# Patient Record
Sex: Female | Born: 1980 | Race: White | Hispanic: No | Marital: Single | Smoking: Never smoker
Health system: Southern US, Community
[De-identification: ages and names within clinical notes are randomized; demographics above are authoritative.]

## PROBLEM LIST (undated history)

## (undated) DIAGNOSIS — F419 Anxiety disorder, unspecified: Secondary | ICD-10-CM

## (undated) DIAGNOSIS — D6851 Activated protein C resistance: Secondary | ICD-10-CM

## (undated) DIAGNOSIS — C801 Malignant (primary) neoplasm, unspecified: Secondary | ICD-10-CM

## (undated) DIAGNOSIS — Z7901 Long term (current) use of anticoagulants: Secondary | ICD-10-CM

## (undated) DIAGNOSIS — F329 Major depressive disorder, single episode, unspecified: Secondary | ICD-10-CM

## (undated) DIAGNOSIS — I82409 Acute embolism and thrombosis of unspecified deep veins of unspecified lower extremity: Secondary | ICD-10-CM

## (undated) DIAGNOSIS — R519 Headache, unspecified: Secondary | ICD-10-CM

## (undated) DIAGNOSIS — G43909 Migraine, unspecified, not intractable, without status migrainosus: Secondary | ICD-10-CM

## (undated) DIAGNOSIS — I2699 Other pulmonary embolism without acute cor pulmonale: Secondary | ICD-10-CM

## (undated) DIAGNOSIS — I890 Lymphedema, not elsewhere classified: Secondary | ICD-10-CM

## (undated) DIAGNOSIS — C4490 Unspecified malignant neoplasm of skin, unspecified: Secondary | ICD-10-CM

## (undated) DIAGNOSIS — D649 Anemia, unspecified: Secondary | ICD-10-CM

## (undated) DIAGNOSIS — Z87442 Personal history of urinary calculi: Secondary | ICD-10-CM

## (undated) DIAGNOSIS — D751 Secondary polycythemia: Secondary | ICD-10-CM

## (undated) DIAGNOSIS — N200 Calculus of kidney: Secondary | ICD-10-CM

## (undated) HISTORY — DX: Migraine, unspecified, not intractable, without status migrainosus: G43.909

## (undated) HISTORY — PX: IR FALLOPIAN TUBE CATHETERIZATION: IMG633

## (undated) HISTORY — DX: Calculus of kidney: N20.0

## (undated) HISTORY — DX: Acute embolism and thrombosis of unspecified deep veins of unspecified lower extremity: I82.409

## (undated) HISTORY — DX: Unspecified malignant neoplasm of skin, unspecified: C44.90

## (undated) HISTORY — DX: Major depressive disorder, single episode, unspecified: F32.9

## (undated) HISTORY — DX: Long term (current) use of anticoagulants: Z79.01

## (undated) HISTORY — PX: IVC FILTER INSERTION: CATH118245

## (undated) HISTORY — DX: Secondary polycythemia: D75.1

## (undated) HISTORY — PX: OTHER SURGICAL HISTORY: SHX169

## (undated) HISTORY — PX: GANGLION CYST EXCISION: SHX1691

## (undated) HISTORY — PX: WISDOM TOOTH EXTRACTION: SHX21

---

## 2000-07-16 DIAGNOSIS — I82409 Acute embolism and thrombosis of unspecified deep veins of unspecified lower extremity: Secondary | ICD-10-CM

## 2000-07-16 HISTORY — DX: Acute embolism and thrombosis of unspecified deep veins of unspecified lower extremity: I82.409

## 2004-02-20 ENCOUNTER — Other Ambulatory Visit: Payer: Self-pay

## 2004-12-13 ENCOUNTER — Emergency Department: Payer: Self-pay | Admitting: Emergency Medicine

## 2004-12-14 ENCOUNTER — Ambulatory Visit: Payer: Self-pay | Admitting: Emergency Medicine

## 2005-07-25 ENCOUNTER — Ambulatory Visit: Payer: Self-pay | Admitting: Unknown Physician Specialty

## 2005-08-06 ENCOUNTER — Emergency Department: Payer: Self-pay | Admitting: Emergency Medicine

## 2006-03-26 ENCOUNTER — Emergency Department: Payer: Self-pay | Admitting: Emergency Medicine

## 2008-09-28 ENCOUNTER — Ambulatory Visit: Payer: Self-pay | Admitting: Gastroenterology

## 2008-11-09 ENCOUNTER — Ambulatory Visit: Payer: Self-pay | Admitting: Gastroenterology

## 2008-12-28 ENCOUNTER — Emergency Department: Payer: Self-pay | Admitting: Emergency Medicine

## 2009-03-23 ENCOUNTER — Ambulatory Visit: Payer: Self-pay | Admitting: Nurse Practitioner

## 2009-04-15 ENCOUNTER — Ambulatory Visit: Payer: Self-pay | Admitting: Nurse Practitioner

## 2009-05-16 ENCOUNTER — Ambulatory Visit: Payer: Self-pay | Admitting: Nurse Practitioner

## 2009-07-06 ENCOUNTER — Emergency Department: Payer: Self-pay

## 2009-12-19 ENCOUNTER — Inpatient Hospital Stay: Payer: Self-pay | Admitting: Internal Medicine

## 2010-01-03 ENCOUNTER — Ambulatory Visit: Payer: Self-pay | Admitting: Internal Medicine

## 2010-04-05 ENCOUNTER — Ambulatory Visit: Payer: Self-pay | Admitting: Obstetrics and Gynecology

## 2010-04-14 ENCOUNTER — Ambulatory Visit: Payer: Self-pay | Admitting: Cardiovascular Disease

## 2010-04-18 ENCOUNTER — Ambulatory Visit: Payer: Self-pay | Admitting: Obstetrics and Gynecology

## 2011-04-25 ENCOUNTER — Ambulatory Visit: Payer: Self-pay | Admitting: Internal Medicine

## 2011-05-02 ENCOUNTER — Ambulatory Visit: Payer: Self-pay | Admitting: Internal Medicine

## 2011-05-18 ENCOUNTER — Ambulatory Visit: Payer: Self-pay | Admitting: Internal Medicine

## 2011-08-24 ENCOUNTER — Emergency Department: Payer: Self-pay | Admitting: Unknown Physician Specialty

## 2011-09-04 IMAGING — US US EXTREM LOW VENOUS*R*
1 series · 17 of 22 positions shown · non-contrast
Comparison: none

REASON FOR EXAM: hx of DVT 6 yrs ago, now with swelling, possible DVT
COMMENTS:

PROCEDURE:     US  - US DOPPLER LOW EXTR RIGHT  - December 19, 2009  [DATE]
RESULT:     History: DVT.

[Series 1: us extrem low venous*right* · 17 of 22 slices shown]
[im 1/22]
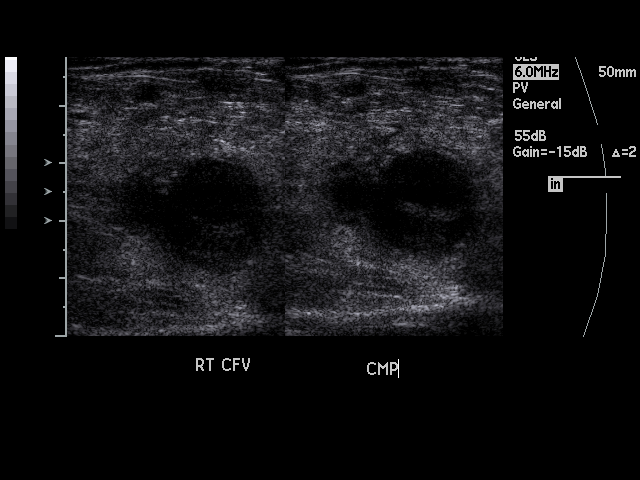
[im 2/22]
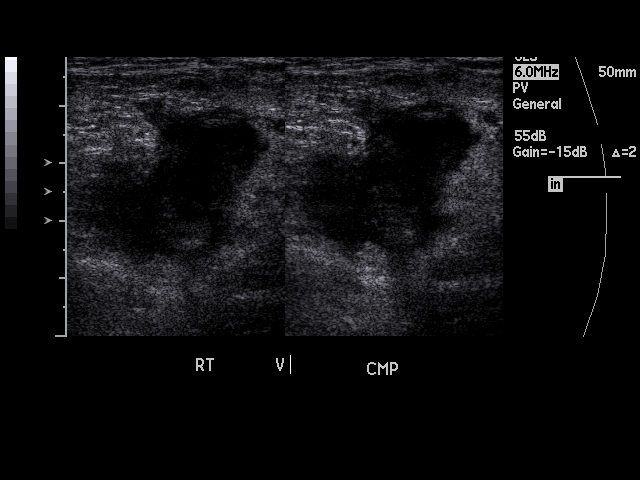
[im 4/22]
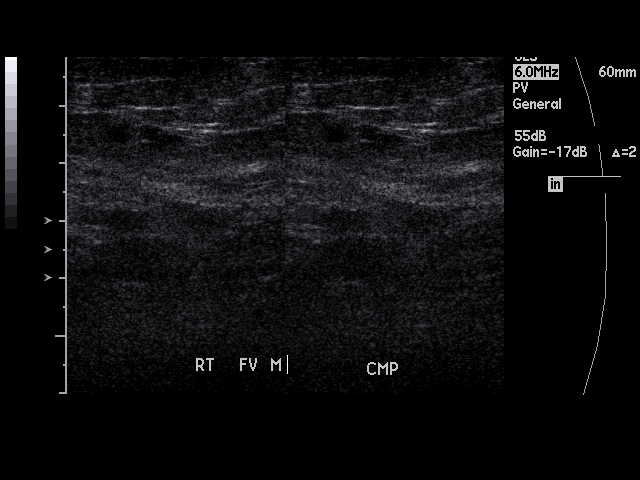
[im 5/22]
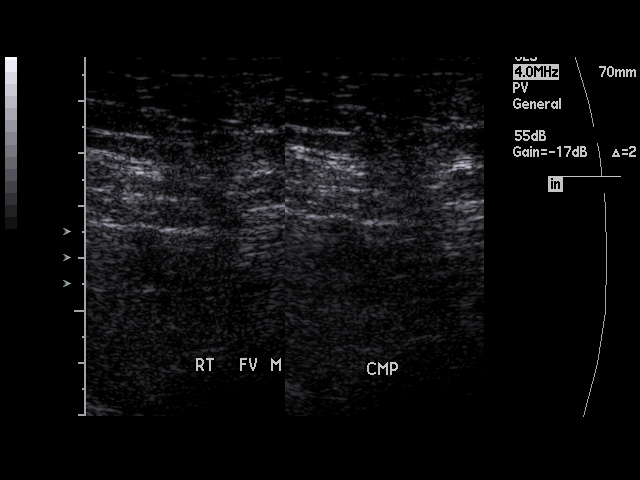
[im 6/22]
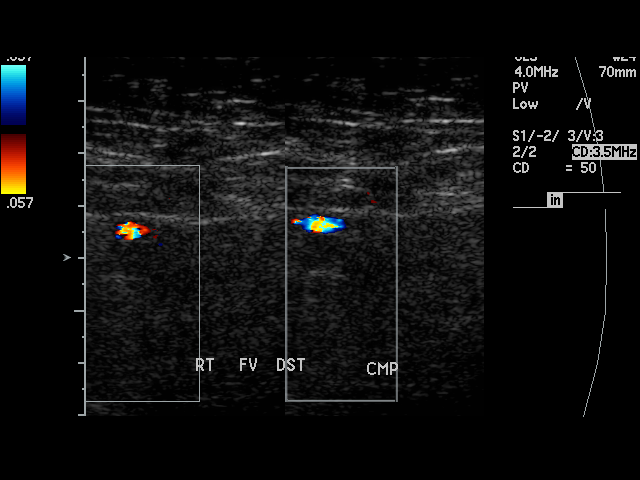
[im 8/22]
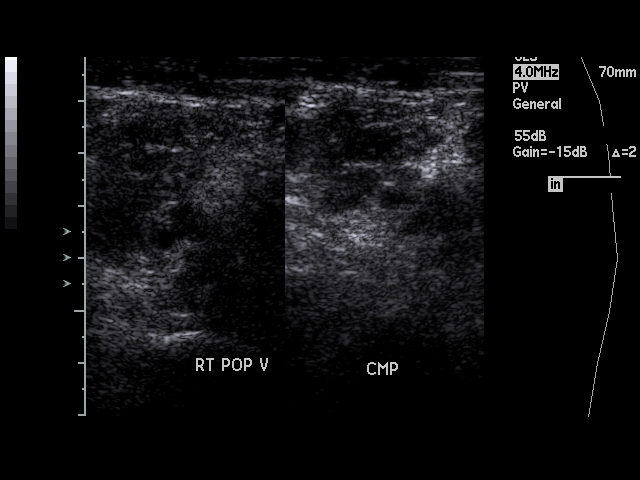
[im 9/22]
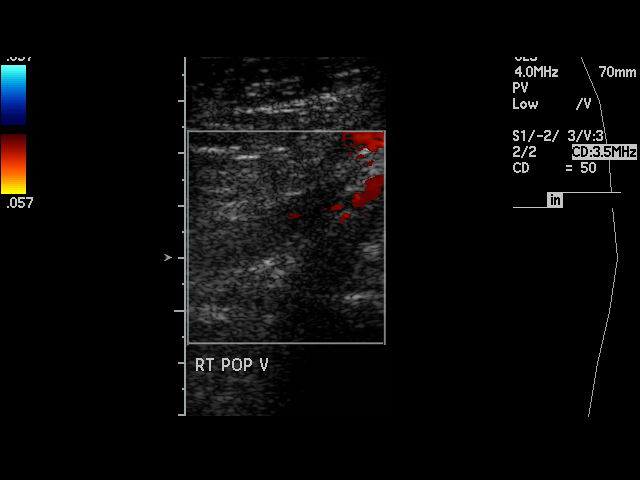
[im 10/22]
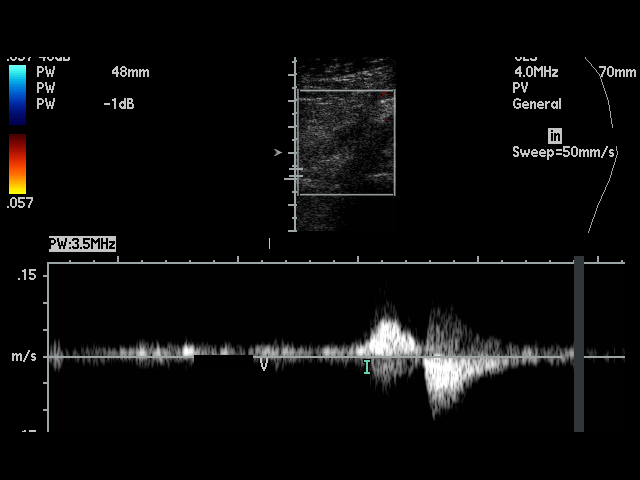
[im 12/22]
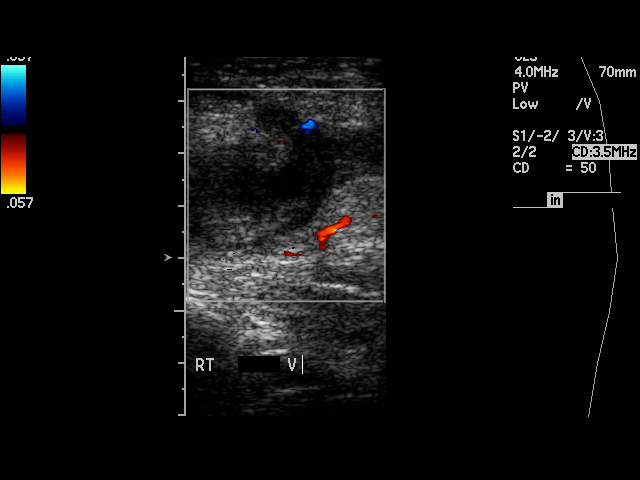
[im 13/22]
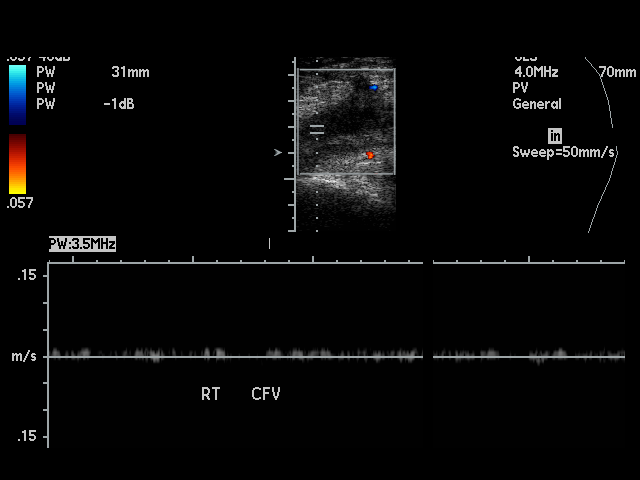
[im 14/22]
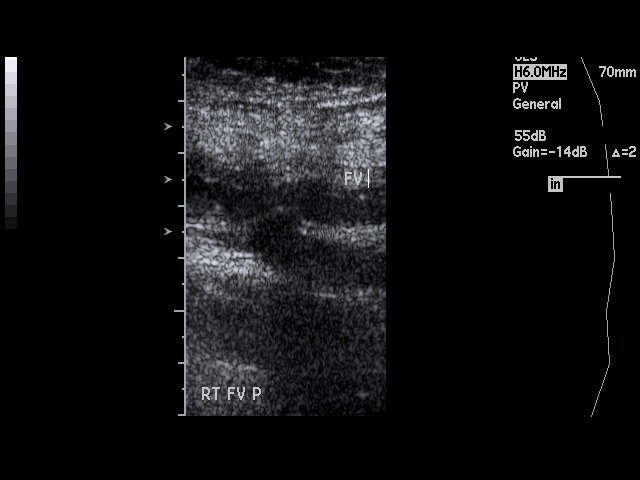
[im 15/22]
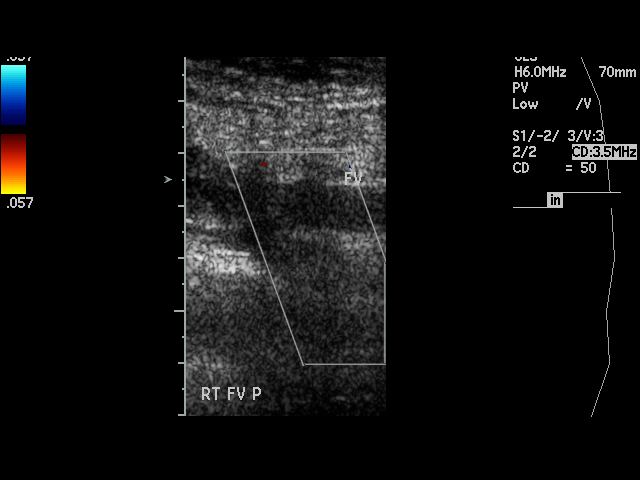
[im 17/22]
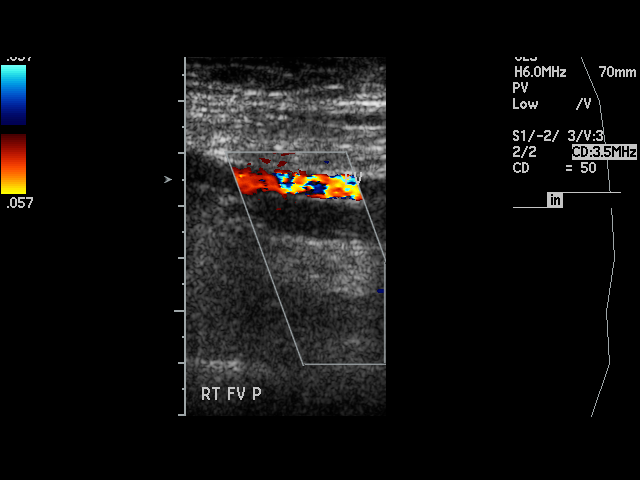
[im 18/22]
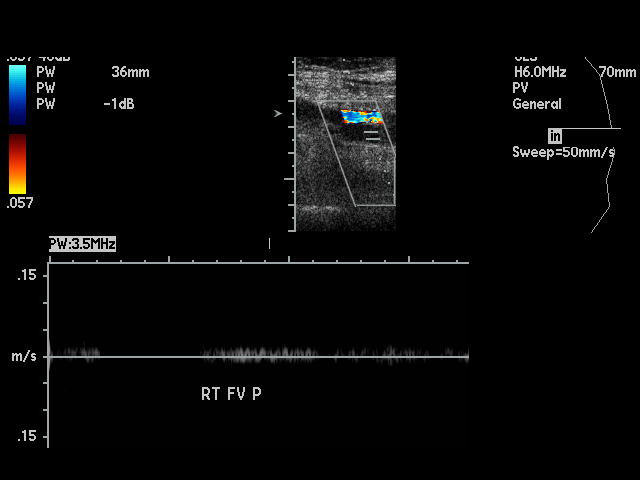
[im 19/22]
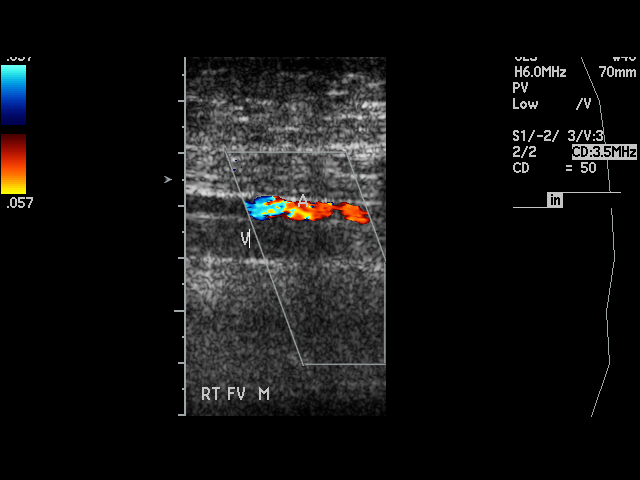
[im 21/22]
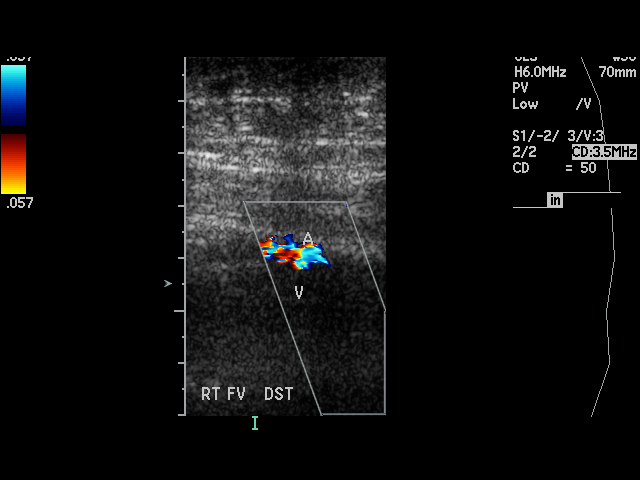
[im 22/22]
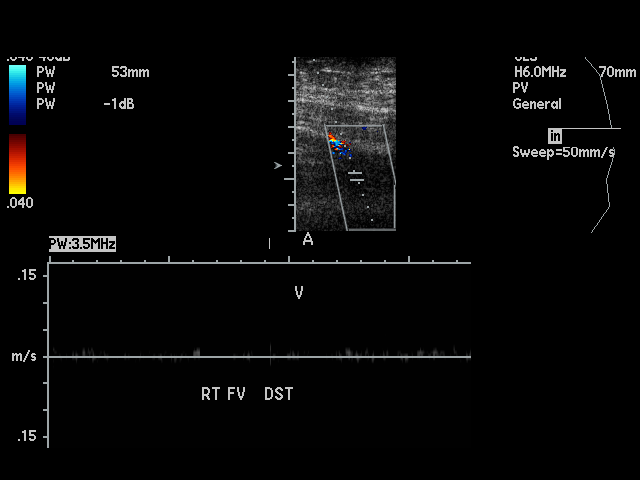

[17 of 22 positions shown; findings below may reference images not displayed]

FINDINGS: Extensive deep venous thrombosis noted throughout right lower
extremity deep venous system. Standard Doppler analysis, color flow
analysis, and compression studies were obtained.
IMPRESSION: Right lower extremity color flow duplex Doppler is positive
for deep venous thrombosis.

## 2012-02-23 ENCOUNTER — Emergency Department: Payer: Self-pay | Admitting: Emergency Medicine

## 2012-02-23 LAB — COMPREHENSIVE METABOLIC PANEL
Alkaline Phosphatase: 90 U/L (ref 50–136)
Anion Gap: 12 (ref 7–16)
Bilirubin,Total: 0.4 mg/dL (ref 0.2–1.0)
Calcium, Total: 9.7 mg/dL (ref 8.5–10.1)
Chloride: 104 mmol/L (ref 98–107)
Co2: 24 mmol/L (ref 21–32)
Creatinine: 0.84 mg/dL (ref 0.60–1.30)
EGFR (African American): >60
EGFR (Non-African Amer.): >60
Glucose: 111 mg/dL — ABNORMAL HIGH (ref 65–99)
Osmolality: 281 (ref 275–301)
Potassium: 4.5 mmol/L (ref 3.5–5.1)
Sodium: 140 mmol/L (ref 136–145)

## 2012-02-23 LAB — CBC
HGB: 15.1 g/dL (ref 12.0–16.0)
MCHC: 34.8 g/dL (ref 32.0–36.0)
MCV: 90 fL (ref 80–100)
RBC: 4.81 10*6/uL (ref 3.80–5.20)
WBC: 11.8 10*3/uL — ABNORMAL HIGH (ref 3.6–11.0)

## 2012-02-23 LAB — PREGNANCY, URINE: Pregnancy Test, Urine: NEGATIVE m[IU]/mL

## 2012-02-23 LAB — URINALYSIS, COMPLETE
Bacteria: NONE SEEN
Glucose,UR: NEGATIVE mg/dL (ref 0–75)
Nitrite: NEGATIVE
Ph: 6 (ref 4.5–8.0)
Protein: NEGATIVE
Specific Gravity: 1.024 (ref 1.003–1.030)
WBC UR: 3 /HPF (ref 0–5)

## 2012-03-18 ENCOUNTER — Ambulatory Visit: Payer: Self-pay | Admitting: Urology

## 2013-01-08 IMAGING — US US EXTREM LOW VENOUS*R*
1 series · 17 of 24 positions shown · non-contrast
Comparison: none

REASON FOR EXAM: CR [id number] Rt Leg Pain Swelling Hx DVT
COMMENTS:

PROCEDURE:     US  - US DOPPLER LOW EXTR RIGHT  - April 25, 2011 [DATE]
RESULT:     Comparison: None

[Series 1: us extrem low venous*right* · 17 of 39 slices shown]
[im 1/39]
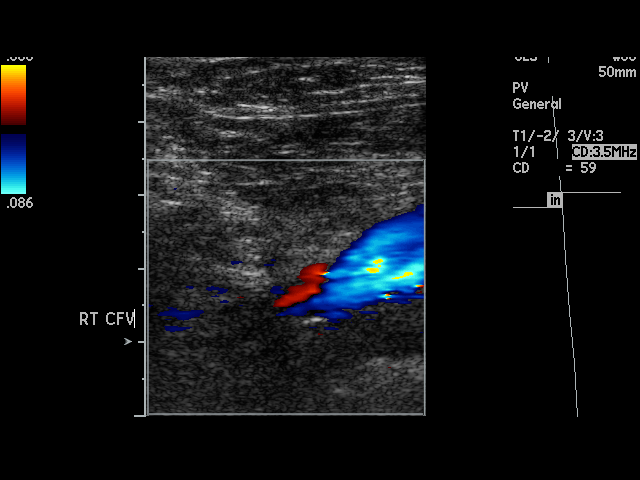
[im 4/39]
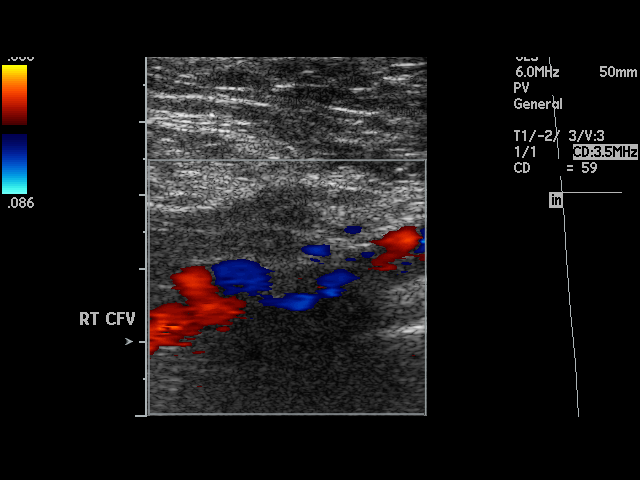
[im 5/39]
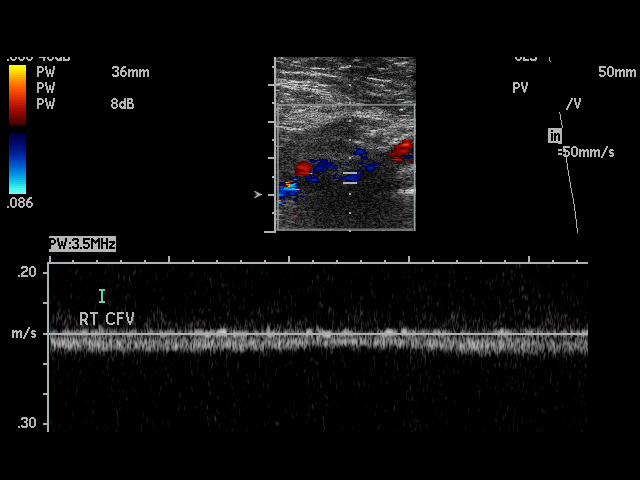
[im 7/39]
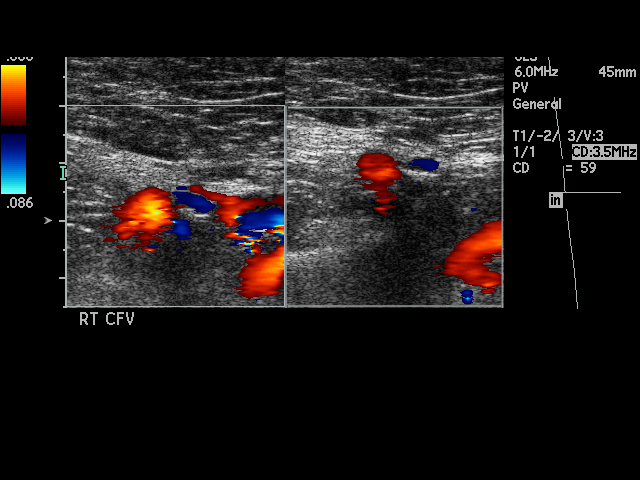
[im 10/39]
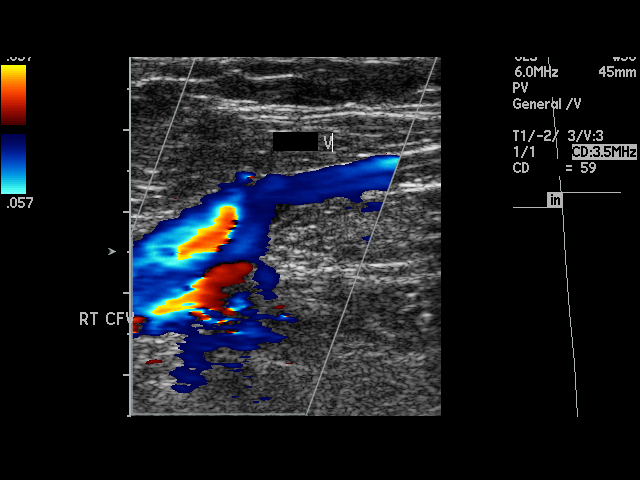
[im 12/39]
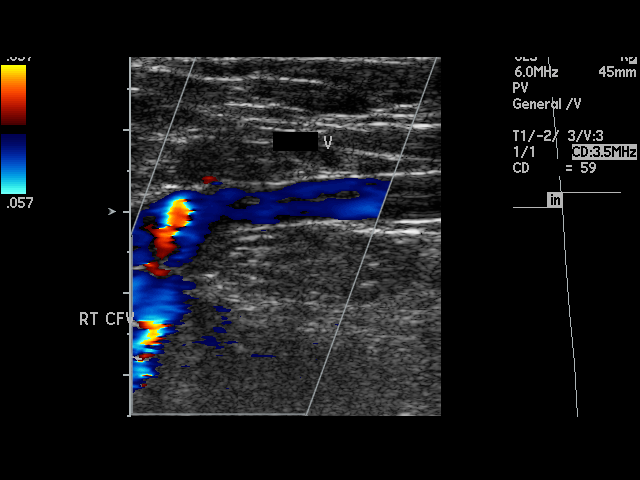
[im 15/39]
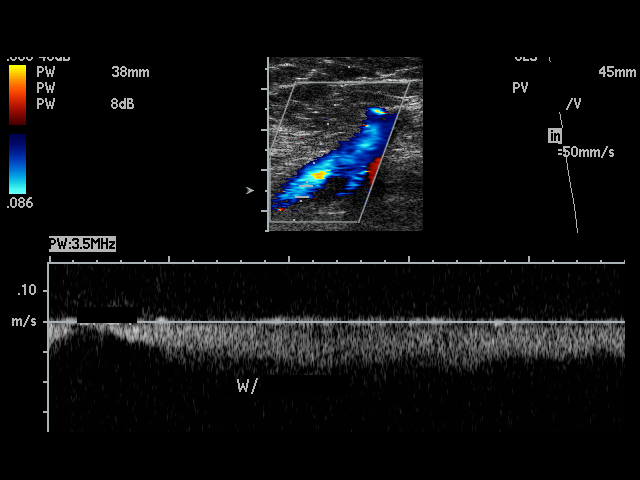
[im 17/39]
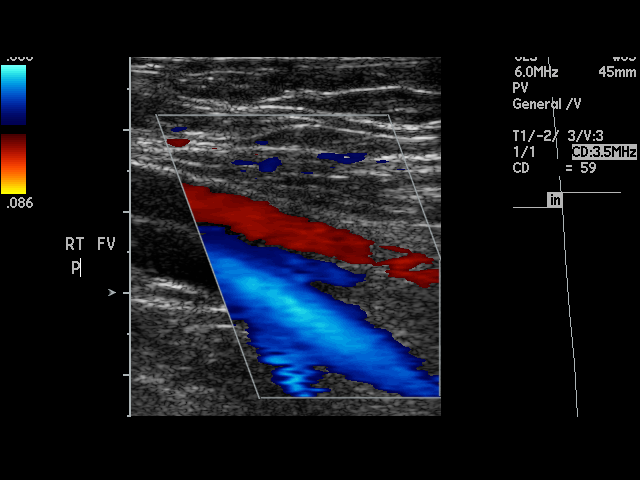
[im 20/39]
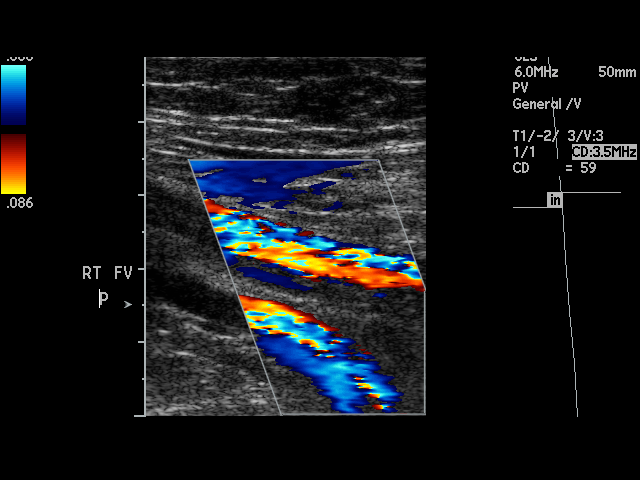
[im 22/39]
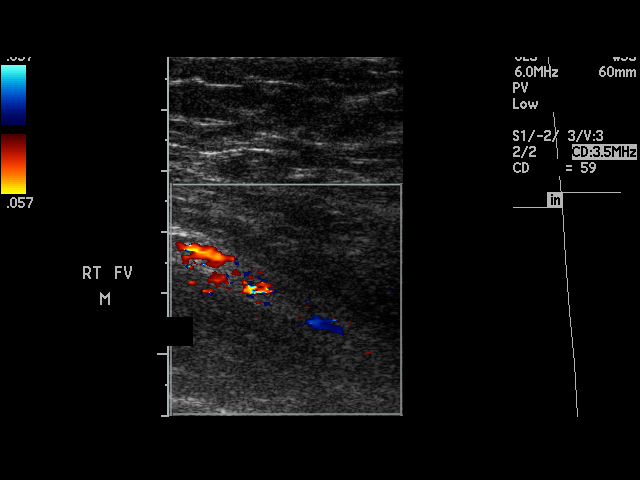
[im 24/39]
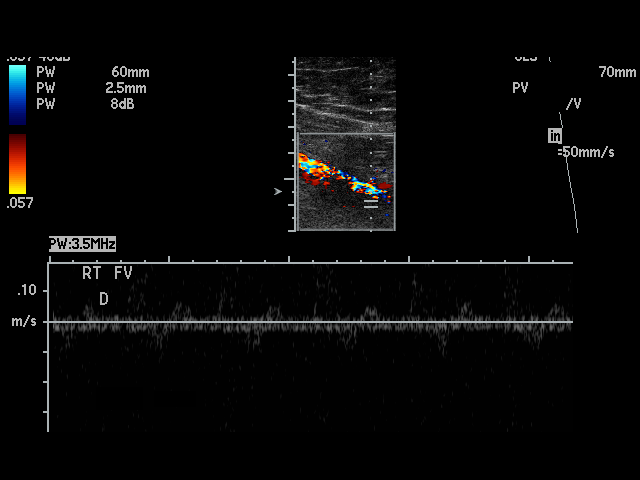
[im 27/39]
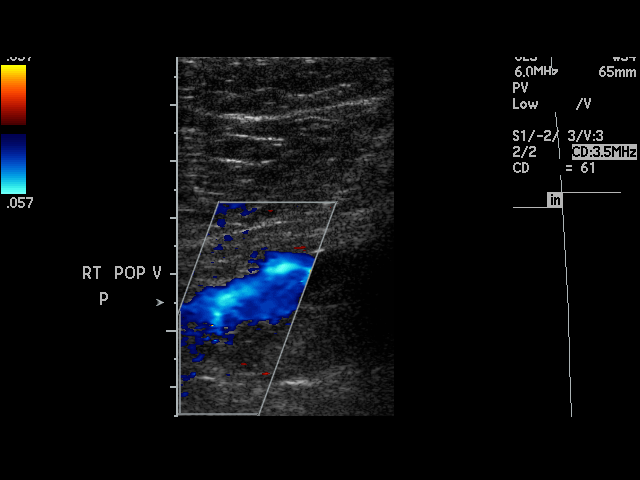
[im 29/39]
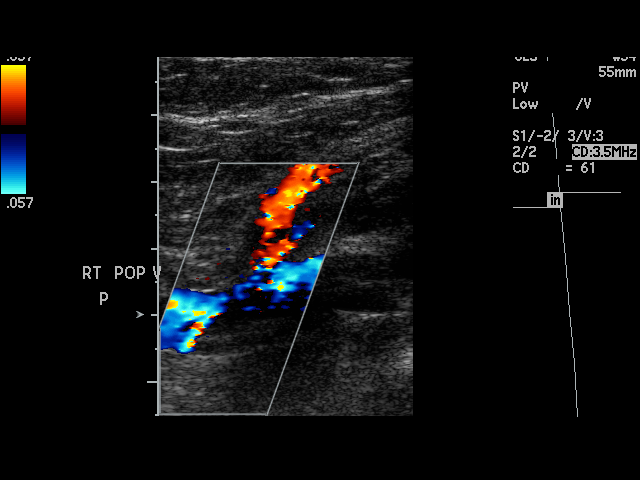
[im 32/39]
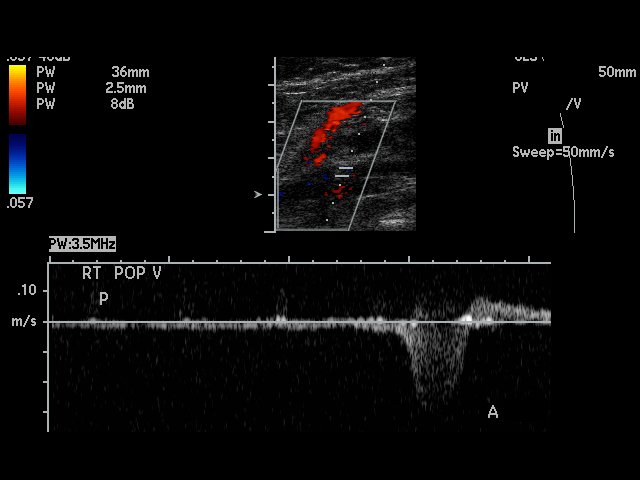
[im 34/39]
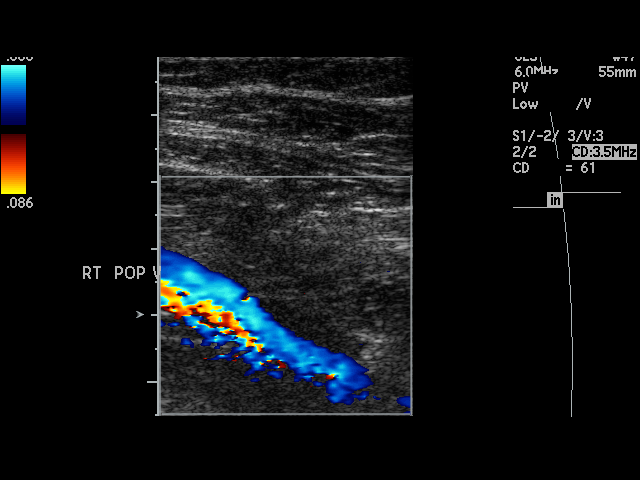
[im 35/39]
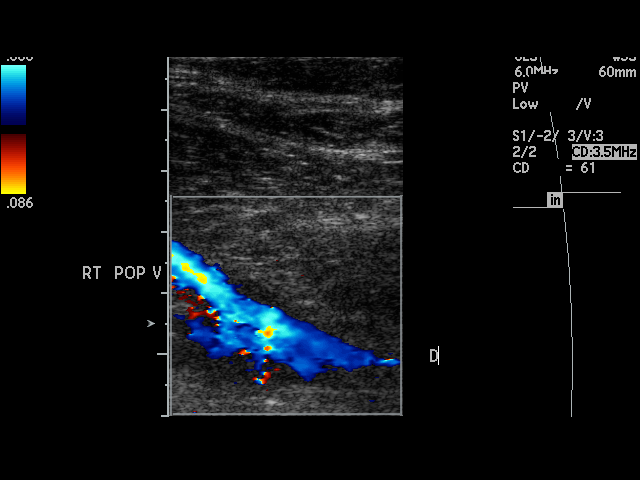
[im 39/39]
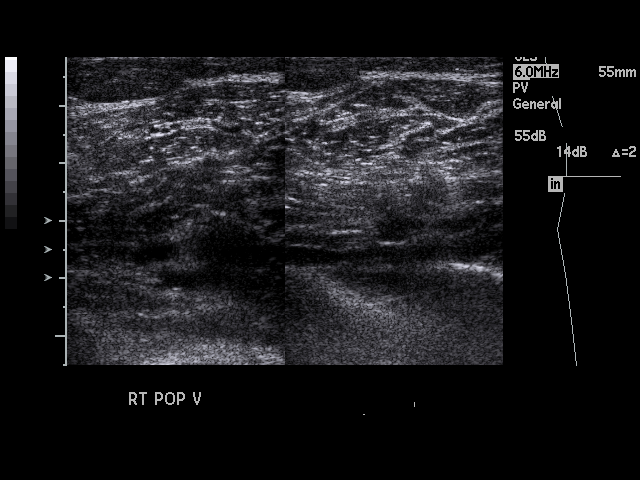

[17 of 24 positions shown; findings below may reference images not displayed]

FINDINGS: Multiple longitudinal and transverse gray-scale as well as color
and spectral Doppler images of the right lower extremity veins were obtained
from the common femoral veins through the popliteal veins. Examination is
limited.

The right common femoral, greater saphenous, femoral, popliteal veins, and
venous trifurcation are patent, demonstrating color-flow throughout the deep
venous system. There is partial compressibility with thickening walls of the
veins which may reflect chronic/resolving thrombus, but acute thrombus
superimposed upon chronic thrombus cannot be differentiated. No intraluminal
thrombus is identified.There is normal respiratory variation and
augmentation demonstrated at all vein levels.
IMPRESSION: There is partial compressibility with thickening walls of the veins which
may reflect chronic/resolving thrombus, but acute thrombus superimposed upon
chronic thrombus cannot be differentiated as a few of the areas are more
hyperechoic.

## 2014-09-06 ENCOUNTER — Emergency Department: Payer: Self-pay | Admitting: Emergency Medicine

## 2015-06-27 DIAGNOSIS — R2689 Other abnormalities of gait and mobility: Secondary | ICD-10-CM | POA: Insufficient documentation

## 2015-06-27 DIAGNOSIS — Z86718 Personal history of other venous thrombosis and embolism: Secondary | ICD-10-CM | POA: Insufficient documentation

## 2015-09-26 DIAGNOSIS — R42 Dizziness and giddiness: Secondary | ICD-10-CM | POA: Insufficient documentation

## 2015-11-04 ENCOUNTER — Ambulatory Visit: Payer: Self-pay | Attending: Neurology

## 2015-11-11 ENCOUNTER — Encounter: Payer: Self-pay | Admitting: Physical Therapy

## 2015-11-15 ENCOUNTER — Encounter: Payer: Self-pay | Admitting: Physical Therapy

## 2015-12-28 ENCOUNTER — Ambulatory Visit: Payer: Medicaid Other

## 2015-12-28 ENCOUNTER — Other Ambulatory Visit: Payer: Self-pay | Admitting: Internal Medicine

## 2015-12-28 DIAGNOSIS — R600 Localized edema: Secondary | ICD-10-CM

## 2015-12-28 DIAGNOSIS — Z6841 Body Mass Index (BMI) 40.0 and over, adult: Secondary | ICD-10-CM | POA: Insufficient documentation

## 2015-12-28 DIAGNOSIS — R252 Cramp and spasm: Secondary | ICD-10-CM | POA: Insufficient documentation

## 2015-12-30 ENCOUNTER — Ambulatory Visit
Admission: RE | Admit: 2015-12-30 | Discharge: 2015-12-30 | Disposition: A | Discharge status: Home / Self Care | Payer: Medicaid Other | Source: Ambulatory Visit | Attending: Internal Medicine | Admitting: Internal Medicine

## 2015-12-30 DIAGNOSIS — I82431 Acute embolism and thrombosis of right popliteal vein: Secondary | ICD-10-CM | POA: Insufficient documentation

## 2015-12-30 DIAGNOSIS — R600 Localized edema: Secondary | ICD-10-CM | POA: Diagnosis present

## 2015-12-30 DIAGNOSIS — I82411 Acute embolism and thrombosis of right femoral vein: Secondary | ICD-10-CM | POA: Insufficient documentation

## 2016-01-26 ENCOUNTER — Inpatient Hospital Stay: Payer: Medicaid Other | Attending: Internal Medicine | Admitting: Internal Medicine

## 2016-01-26 ENCOUNTER — Encounter: Payer: Self-pay | Admitting: Internal Medicine

## 2016-01-26 VITALS — BP 154/108 | HR 89 | Temp 97.8°F | Resp 18 | Wt 210.5 lb

## 2016-01-26 DIAGNOSIS — I82411 Acute embolism and thrombosis of right femoral vein: Secondary | ICD-10-CM | POA: Insufficient documentation

## 2016-01-26 DIAGNOSIS — Z87442 Personal history of urinary calculi: Secondary | ICD-10-CM | POA: Insufficient documentation

## 2016-01-26 DIAGNOSIS — Z9181 History of falling: Secondary | ICD-10-CM | POA: Diagnosis not present

## 2016-01-26 DIAGNOSIS — Z79899 Other long term (current) drug therapy: Secondary | ICD-10-CM | POA: Diagnosis not present

## 2016-01-26 DIAGNOSIS — E669 Obesity, unspecified: Secondary | ICD-10-CM

## 2016-01-26 DIAGNOSIS — R531 Weakness: Secondary | ICD-10-CM | POA: Insufficient documentation

## 2016-01-26 DIAGNOSIS — Z7901 Long term (current) use of anticoagulants: Secondary | ICD-10-CM | POA: Diagnosis not present

## 2016-01-26 DIAGNOSIS — R760 Raised antibody titer: Secondary | ICD-10-CM | POA: Diagnosis not present

## 2016-01-26 DIAGNOSIS — Z86718 Personal history of other venous thrombosis and embolism: Secondary | ICD-10-CM | POA: Diagnosis not present

## 2016-01-26 NOTE — Assessment & Plan Note (Signed)
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 Continue Eliquis for now.  # Await vascular input regarding possible vascular intervention to help with post phlebitic syndrome/ or possibly in stent thrombosis as noted on imaging in May of 2014 at [facility].  # Patient follow-up with me in approximately 3 months/no labs.   Thank you Dr.Hande for allowing me to participate in the care of your pleasant patient. Please do not hesitate to contact me with questions or concerns in the interim.

## 2016-01-26 NOTE — Progress Notes (Signed)
Patient here today as new evaluation regarding DVT in right thigh.  Referred by Dr. Judithann Sheen.

## 2016-01-26 NOTE — Progress Notes (Signed)
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 PE- 2005/pregnancy]; factor V leiden heterozygous; Lupus anticoagulant positivity; Obesity [last Seen Dr. Molinda Bailiff; May 2014] s/p Stenting Right iliac/ s/p IVC filter- on xarelto; JUne 2017- Fem/pop V DV- chronic/acute ; July 2017- Started on Eliquis [Dr.Schneir]    HISTORY OF PRESENTING ILLNESS:  Caroline Madden 35 y.o.  female with a history of multiple DVT of the right lower extremity [risk factors listed above]- has been referred to [place] for further evaluation and recommendations.   Patient most recently noted to have swelling in the right lower extremity that led to an lower extremity venous Dopplers- that showed popliteal/femoral vein DVT clot- chronic versus component of acute. Patient has been switched over to Eliquis as per vascular surgery. Patient has chronic pain in the right lower extremity. She does not wear stockings.  Otherwise patient's denies any unusual bleeding. No clots anywhere else. She does complain of falls/ because of "weakness of the right lower extremity".   I reviewed the note from [facility] hematology- in May 2014 patient had pelvic CT at that showed right iliac stent thrombosis needing intervention. Patient had not followed up for unclear reasons.  ROS: A complete 10 point review of system is done which is negative except mentioned above in history of present illness  MEDICAL HISTORY:  Past Medical History  Diagnosis Date  . DVT (deep venous thrombosis) ([place]) 2002  . Renal calculi     SURGICAL HISTORY: Past Surgical History  Procedure Laterality Date  . Ivp      SOCIAL HISTORY: Social History   Social History  . Marital Status: Single    Spouse Name: N/A  . Number of Children: N/A  . Years of Education: N/A    Occupational History  . Not on file.   Social History Main Topics  . Smoking status: Not on file  . Smokeless tobacco: Not on file  . Alcohol Use: Not on file  . Drug Use: Not on file  . Sexual Activity: Not on file   Other Topics Concern  . Not on file   Social History Narrative  . No narrative on file    FAMILY HISTORY: No family history on file.  ALLERGIES:  is allergic to penicillin v potassium; penicillins; strawberry extract; wheat bran; and other.  MEDICATIONS:  Current Outpatient Prescriptions  Medication Sig Dispense Refill  . ELIQUIS 5 MG TABS tablet TAKE 1 TABLET BY MOUTH TWICE PER DAY  0  . phentermine 37.5 MG capsule Take by mouth.     No current facility-administered medications for this visit.      Marland Kitchen  PHYSICAL EXAMINATION:   Filed Vitals:   01/26/16 1144  BP: 154/108  Pulse: 89  Temp: 97.8 F (36.6 C)  Resp: 18   Filed Weights   01/26/16 1144  Weight: 210 lb 8.6 oz (95.5 kg)    GENERAL: Well-nourished well-developed; Alert, no distress and comfortable. Alone. Obese.  EYES: no pallor or icterus OROPHARYNX: no thrush or ulceration; good dentition  NECK: supple, no masses felt LYMPH:  no palpable lymphadenopathy in the cervical, axillary or inguinal regions LUNGS: clear to auscultation and  No wheeze or crackles HEART/CVS: regular rate & rhythm and no murmurs; swelling of Right ower extremity edema; compared to left ABDOMEN: abdomen soft, non-tender and  normal bowel sounds Musculoskeletal:no cyanosis of digits and no clubbing  PSYCH: alert & oriented x 3 with fluent speech NEURO: no focal motor/sensory deficits SKIN:  no rashes or significant lesions  LABORATORY DATA:  I have reviewed the data as listed Lab Results  Component Value Date   WBC 11.8* 02/23/2012   HGB 15.1 02/23/2012   HCT 43.4 02/23/2012   MCV 90 02/23/2012   PLT 279 02/23/2012   No results for input(s): NA, K, CL, CO2, GLUCOSE, BUN, CREATININE, CALCIUM, GFRNONAA,  GFRAA, PROT, ALBUMIN, AST, ALT, ALKPHOS, BILITOT, BILIDIR, IBILI in the last 8760 hours.  RADIOGRAPHIC STUDIES: I have personally reviewed the radiological images as listed and agreed with the findings in the report. US Venous Img Lower Unilateral Right  12/30/2015  CLINICAL DATA:  Right leg edema EXAM: RIGHT LOWER EXTREMITY VENOUS DUPLEX ULTRASOUND TECHNIQUE: Doppler venous assessment of the left lower extremity deep venous system was performed, including characterization of spectral flow, compressibility, and phasicity. COMPARISON:  None. FINDINGS: There is partially occlusive thrombus throughout the right common femoral common femoral, and popliteal veins. The thrombus is heterogeneous and there is a channel of flow traversing through the heterogeneous thrombus. Doppler analysis demonstrates phasicity. Augmentation was deferred. IMPRESSION: There is partially occlusive thrombus throughout the femoral and popliteal venous system. This has a chronic appearance as there is re- cannulization and heterogeneous signal within the thrombus. Acute upon chronic thrombus would be difficult to exclude on this study. Electronically Signed   By: Marybelle Killings M.D.   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 Continue Eliquis for now.  # Await vascular input regarding possible vascular intervention to help with post phlebitic syndrome/ or possibly in stent thrombosis as noted on imaging in May of 2014 at [facility].  # Patient follow-up with me in approximately 3 months/no labs.   Thank you Dr.Hande for allowing me to participate in the care of your pleasant patient. Please do not hesitate to contact me with questions or concerns in the interim.   # 45 minutes  face-to-face with the patient discussing the above plan of care; more than 50% of time spent on prognosis/ natural history; counseling and coordination.  All questions were answered. The patient knows to call the clinic with any problems, questions or concerns.     Cammie Sickle, MD 01/26/2016 1:45 PM

## 2016-04-27 ENCOUNTER — Encounter (INDEPENDENT_AMBULATORY_CARE_PROVIDER_SITE_OTHER): Payer: Self-pay

## 2016-04-27 ENCOUNTER — Inpatient Hospital Stay: Payer: Medicaid Other | Attending: Internal Medicine | Admitting: Internal Medicine

## 2016-04-27 ENCOUNTER — Encounter: Payer: Self-pay | Admitting: Internal Medicine

## 2016-04-27 VITALS — BP 119/87 | HR 106 | Temp 96.6°F | Resp 17 | Ht <= 58 in | Wt 200.4 lb

## 2016-04-27 DIAGNOSIS — E669 Obesity, unspecified: Secondary | ICD-10-CM

## 2016-04-27 DIAGNOSIS — Z86718 Personal history of other venous thrombosis and embolism: Secondary | ICD-10-CM | POA: Insufficient documentation

## 2016-04-27 DIAGNOSIS — Z87442 Personal history of urinary calculi: Secondary | ICD-10-CM | POA: Diagnosis not present

## 2016-04-27 DIAGNOSIS — R531 Weakness: Secondary | ICD-10-CM | POA: Diagnosis not present

## 2016-04-27 DIAGNOSIS — D6862 Lupus anticoagulant syndrome: Secondary | ICD-10-CM

## 2016-04-27 DIAGNOSIS — D6851 Activated protein C resistance: Secondary | ICD-10-CM | POA: Diagnosis not present

## 2016-04-27 DIAGNOSIS — Z7901 Long term (current) use of anticoagulants: Secondary | ICD-10-CM

## 2016-04-27 DIAGNOSIS — I82411 Acute embolism and thrombosis of right femoral vein: Secondary | ICD-10-CM

## 2016-04-27 NOTE — Progress Notes (Signed)
Pt reports more leg cramps and bleeding gums. Pt reports when she shaves she bleeds.  Pt reports her legs give out and cause her to fall.  Fatigue keeps her from doing ADL.  SOB on exertion.  Reports that her voice comes and goes often.  Today pt is hoarse.  Reports several bm's daily.

## 2016-04-27 NOTE — Progress Notes (Signed)
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 PE- 2005/pregnancy]; factor V leiden heterozygous; Lupus anticoagulant positivity; Obesity [last Seen Dr. Molinda Bailiff; May 2014] s/p Stenting Right iliac/ s/p IVC filter- on xarelto; JUne 2017- Fem/pop V DV- chronic/acute ; July 2017- Started on Eliquis [Dr.Schneir]  # Right iliac stent thrombosis [at [facility] 2014]/ chronic Right LE thormbophlebitis    HISTORY OF PRESENTING ILLNESS:  Caroline Madden 35 y.o.  female with a history of multiple DVT of the right lower extremity [risk factors listed above]- currently on Eliquis; and right leg postphlebitic syndrome is here for follow-up  Patient seems to be tolerating her anticoagulation fairly well. Otherwise patient's denies any unusual bleeding. No clots anywhere else. She does complain of falls/ because of "weakness of the right lower extremity".   ROS: A complete 10 point review of system is done which is negative except mentioned above in history of present illness  MEDICAL HISTORY:  Past Medical History:  Diagnosis Date  . DVT (deep venous thrombosis) ([place]) 2002  . Renal calculi     SURGICAL HISTORY: Past Surgical History:  Procedure Laterality Date  . IVP      SOCIAL HISTORY: Social History   Social History  . Marital status: Single    Spouse name: N/A  . Number of children: N/A  . Years of education: N/A   Occupational History  . Not on file.   Social History Main Topics  . Smoking status: Never Smoker  . Smokeless tobacco: Never Used  . Alcohol use No  . Drug use: No  . Sexual activity: Not on file   Other Topics Concern  . Not on file   Social History Narrative  . No narrative on file    FAMILY HISTORY: History reviewed. No pertinent family history.  ALLERGIES:  is allergic  to penicillin v potassium; penicillins; strawberry extract; wheat bran; and other.  MEDICATIONS:  Current Outpatient Prescriptions  Medication Sig Dispense Refill  . ELIQUIS 5 MG TABS tablet TAKE 1 TABLET BY MOUTH TWICE PER DAY  0  . phentermine 37.5 MG capsule TAKE ONE CAPSULE BY MOUTH EVERY MORNING BEFORE BREAKFAST     No current facility-administered medications for this visit.       Marland Kitchen  PHYSICAL EXAMINATION:   Vitals:   04/27/16 1017  BP: 119/87  Pulse: (!) 106  Resp: 17  Temp: (!) 96.6 F (35.9 C)   Filed Weights   04/27/16 1017  Weight: 200 lb 6.4 oz (90.9 kg)    GENERAL: Well-nourished well-developed; Alert, no distress and comfortable. Alone. Obese.  EYES: no pallor or icterus OROPHARYNX: no thrush or ulceration; good dentition  NECK: supple, no masses felt LYMPH:  no palpable lymphadenopathy in the cervical, axillary or inguinal regions LUNGS: clear to auscultation and  No wheeze or crackles HEART/CVS: regular rate & rhythm and no murmurs; swelling of Right ower extremity edema; compared to left ABDOMEN: abdomen soft, non-tender and normal bowel sounds Musculoskeletal:no cyanosis of digits and no clubbing  PSYCH: alert & oriented x 3 with fluent speech NEURO: no focal motor/sensory deficits SKIN:  no rashes or significant lesions  LABORATORY DATA:  I have reviewed the data as listed Lab Results  Component Value Date   WBC 11.8 (H) 02/23/2012   HGB 15.1 02/23/2012   HCT  43.4 02/23/2012   MCV 90 02/23/2012   PLT 279 02/23/2012   No results for input(s): NA, K, CL, CO2, GLUCOSE, BUN, CREATININE, CALCIUM, GFRNONAA, GFRAA, PROT, ALBUMIN, AST, ALT, ALKPHOS, BILITOT, BILIDIR, IBILI in the last 8760 hours.  RADIOGRAPHIC STUDIES: I have personally reviewed the radiological images as listed and agreed with the findings in the report. No results found.  ASSESSMENT & PLAN:  Acute deep vein thrombosis (DVT) of femoral vein of right lower extremity (HCC) History  of repeated/recurrent DVT of the right lower extremity- most recently question acute/chronic femoral/popliteal vein DVT- currently on Eliquis as per vascular/ Dr.Schneir. Continue Eliquis for now. Patient will need indefinite anticoagulation.  # post phlebitic syndrome/ or possibly in stent thrombosis as noted on imaging in May of 2014 at [facility]. Recommend stockings; will check with Dr.Schneir.   # Falls/ ? Leg numbness? Will discuss with Dr.Hande.   # Follow up as needed- has no new recommendations from hematology standpoint.     Cammie Sickle, MD 04/28/2016 11:18 AM

## 2016-04-27 NOTE — Assessment & Plan Note (Addendum)
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 Continue Eliquis for now. Patient will need indefinite anticoagulation.  # post phlebitic syndrome/ or possibly in stent thrombosis as noted on imaging in May of 2014 at [facility]. Recommend stockings; will check with Dr.Schneir.   # Falls/ ? Leg numbness? Will discuss with Dr.Hande.   # Follow up as needed- has no new recommendations from hematology standpoint.

## 2016-08-07 ENCOUNTER — Other Ambulatory Visit (INDEPENDENT_AMBULATORY_CARE_PROVIDER_SITE_OTHER): Payer: Self-pay | Admitting: Vascular Surgery

## 2017-02-06 ENCOUNTER — Other Ambulatory Visit (INDEPENDENT_AMBULATORY_CARE_PROVIDER_SITE_OTHER): Payer: Self-pay | Admitting: Vascular Surgery

## 2017-02-06 DIAGNOSIS — Z86718 Personal history of other venous thrombosis and embolism: Secondary | ICD-10-CM

## 2017-02-06 DIAGNOSIS — Z95828 Presence of other vascular implants and grafts: Secondary | ICD-10-CM

## 2017-02-10 DIAGNOSIS — I87009 Postthrombotic syndrome without complications of unspecified extremity: Secondary | ICD-10-CM | POA: Insufficient documentation

## 2017-02-10 DIAGNOSIS — I82521 Chronic embolism and thrombosis of right iliac vein: Secondary | ICD-10-CM | POA: Insufficient documentation

## 2017-02-10 NOTE — Progress Notes (Signed)
MRN : [id number]  Caroline Madden is a 36 y.o. (05-17-1981) female who presents with chief complaint of follow up for DVT.  History of Present Illness: Patient last seen on 02/09/16 referred by Dr. Ginette Pitman for "recurrent lower extremity DVT". Patient endorses a history of recurrent lower extremity DVT's which started about 12 years ago after she became pregnant with her first child. The patient has an IVC filter placed years ago - doesn't remember when. She had been on Xarelto for years as well. Was on Coumadin at one point. States she has had a hematology consult years ago and was diagnosed with Factor V Leiden. Patient most recent diagnosis of right lower extremity DVT on 12/30/15 after right lower extremity edema and pain. Duplex on 12/30/15 was notable for partially occlusive thrombus throughout the femoral and popliteal venous system. Patient states pain and swelling has improved. Denies any SOB or chest pain. Patient is now taking Eliquis. Patient was seen by hematology again - no change in current plan. Patient underwent IVC filter duplex which was noted to be patent. 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  No outpatient prescriptions have been marked as taking for the 02/11/17 encounter (Appointment) with Delana Meyer, Dolores Lory, MD.    Past Medical History:  Diagnosis Date  . DVT (deep venous thrombosis) ([place]) 2002  . Renal calculi     Past Surgical History:  Procedure Laterality Date  . IVP      Social History Social History  Substance Use Topics  . Smoking status: Never Smoker  . Smokeless tobacco: Never Used  . Alcohol use No    Family History No family history on file.  Allergies  Allergen Reactions  . Penicillin V Potassium Other (See Comments)  . Penicillins     Other reaction(s): RASH  . Strawberry Extract     Other reaction(s): RASH Other reaction(s): RASH  . Wheat Bran     Other reaction(s): OTHER Other reaction(s): OTHER  . Other Rash     REVIEW OF SYSTEMS  (Negative unless checked)  Constitutional: [] Weight loss  [] Fever  [] Chills Cardiac: [] Chest pain   [] Chest pressure   [] Palpitations   [] Shortness of breath when laying flat   [] Shortness of breath with exertion. Vascular:  [] Pain in legs with walking   [] Pain in legs at rest  [] History of DVT   [] Phlebitis   [] Swelling in legs   [] Varicose veins   [] Non-healing ulcers Pulmonary:   [] Uses home oxygen   [] Productive cough   [] Hemoptysis   [] Wheeze  [] COPD   [] Asthma Neurologic:  [] Dizziness   [] Seizures   [] History of stroke   [] History of TIA  [] Aphasia   [] Vissual changes   [] Weakness or numbness in arm   [] Weakness or numbness in leg Musculoskeletal:   [] Joint swelling   [] Joint pain   [] Low back pain Hematologic:  [] Easy bruising  [] Easy bleeding   [] Hypercoagulable state   [] Anemic Gastrointestinal:  [] Diarrhea   [] Vomiting  [] Gastroesophageal reflux/heartburn   [] Difficulty swallowing. Genitourinary:  [] Chronic kidney disease   [] Difficult urination  [] Frequent urination   [] Blood in urine Skin:  [] Rashes   [] Ulcers  Psychological:  [] History of anxiety   []  History of major depression.  Physical Examination  There were no vitals filed for this visit. There is no height or weight on file to calculate BMI. Gen: WD/WN, NAD Head: [place]/AT, No temporalis wasting.  Ear/Nose/Throat: Hearing grossly intact, nares w/o erythema or drainage Eyes: PER, EOMI, sclera nonicteric.  Neck: Supple, no large masses.   Pulmonary:  Good air movement, no audible wheezing bilaterally, no use of accessory muscles.  Cardiac: RRR, no JVD Vascular:  Vessel Right Left  Radial Palpable Palpable  Ulnar Palpable Palpable  Brachial Palpable Palpable  Carotid Palpable Palpable  Femoral Palpable Palpable  Popliteal Palpable Palpable  PT Palpable Palpable  DP Palpable Palpable  Gastrointestinal: Non-distended. No guarding/no peritoneal signs.  Musculoskeletal: M/S 5/5 throughout.  No deformity or atrophy.    Neurologic: CN 2-12 intact. Symmetrical.  Speech is fluent. Motor exam as listed above. Psychiatric: Judgment intact, Mood & affect appropriate for pt's clinical situation. Dermatologic: No rashes or ulcers noted.  No changes consistent with cellulitis. Lymph : No lichenification or skin changes of chronic lymphedema.  CBC Lab Results  Component Value Date   WBC 11.8 (H) 02/23/2012   HGB 15.1 02/23/2012   HCT 43.4 02/23/2012   MCV 90 02/23/2012   PLT 279 02/23/2012    BMET    Component Value Date/Time   NA 140 02/23/2012 2001   K 4.5 02/23/2012 2001   CL 104 02/23/2012 2001   CO2 24 02/23/2012 2001   GLUCOSE 111 (H) 02/23/2012 2001   BUN 16 02/23/2012 2001   CREATININE 0.84 02/23/2012 2001   CALCIUM 9.7 02/23/2012 2001   GFRNONAA >60 02/23/2012 2001   GFRAA >60 02/23/2012 2001   CrCl cannot be calculated (Patient's most recent lab result is older than the maximum 21 days allowed.).  COAG No results found for: INR, PROTIME  Radiology No results found.   Assessment/Plan 1. Chronic deep vein thrombosis (DVT) of femoral vein of right lower extremity (HCC)  No surgery or intervention at this point in time.    I have reviewed my previous discussion with the patient regarding swelling and why it  causes symptoms.  The patient is doing well with compression and will continue wearing graduated compression stockings class 1 (20-30 mmHg) on a daily basis a prescription was given. The patient will  continue wearing the stockings first thing in the morning and removing them in the evening. The patient is instructed specifically not to sleep in the stockings.    In addition, behavioral modification including elevation during the day and exercise will be continued.    Patient should follow-up on an annual basis   - Ambulatory referral to Pulmonology  2. Post-phlebitic syndrome Continue compression  3. Factor V Leiden ([place]) Continue anticoagulation  4. Lupus anticoagulant  disorder (HCC) Continue anticoagulation   5. Shortness of breath Will ask pulmonary to see  6. Pain in both lower extremities Recommend:  I do not find evidence of Vascular pathology that would explain the patient's symptoms  The patient has atypical pain symptoms for vascular disease  Noninvasive studies including venous ultrasound of the legs do not identify vascular problems  The patient should continue walking and begin a more formal exercise program. The patient should continue his antiplatelet therapy and aggressive treatment of the lipid abnormalities. The patient should begin wearing graduated compression socks 15-20 mmHg strength to control her mild edema.  7. Primary osteoarthritis involving multiple joints contnue NSAID's   Hortencia Pilar, MD  02/10/2017 4:33 PM

## 2017-02-11 ENCOUNTER — Encounter (INDEPENDENT_AMBULATORY_CARE_PROVIDER_SITE_OTHER): Payer: Self-pay | Admitting: Vascular Surgery

## 2017-02-11 ENCOUNTER — Ambulatory Visit (INDEPENDENT_AMBULATORY_CARE_PROVIDER_SITE_OTHER): Payer: Medicaid Other | Admitting: Vascular Surgery

## 2017-02-11 ENCOUNTER — Ambulatory Visit (INDEPENDENT_AMBULATORY_CARE_PROVIDER_SITE_OTHER): Payer: Medicaid Other

## 2017-02-11 DIAGNOSIS — Z95828 Presence of other vascular implants and grafts: Secondary | ICD-10-CM | POA: Diagnosis not present

## 2017-02-11 DIAGNOSIS — R0602 Shortness of breath: Secondary | ICD-10-CM

## 2017-02-11 DIAGNOSIS — D6862 Lupus anticoagulant syndrome: Secondary | ICD-10-CM

## 2017-02-11 DIAGNOSIS — Z86718 Personal history of other venous thrombosis and embolism: Secondary | ICD-10-CM

## 2017-02-11 DIAGNOSIS — I87009 Postthrombotic syndrome without complications of unspecified extremity: Secondary | ICD-10-CM | POA: Diagnosis not present

## 2017-02-11 DIAGNOSIS — M79604 Pain in right leg: Secondary | ICD-10-CM | POA: Diagnosis not present

## 2017-02-11 DIAGNOSIS — I82511 Chronic embolism and thrombosis of right femoral vein: Secondary | ICD-10-CM | POA: Diagnosis not present

## 2017-02-11 DIAGNOSIS — M79605 Pain in left leg: Secondary | ICD-10-CM | POA: Diagnosis not present

## 2017-02-11 DIAGNOSIS — M150 Primary generalized (osteo)arthritis: Secondary | ICD-10-CM | POA: Diagnosis not present

## 2017-02-11 DIAGNOSIS — M159 Polyosteoarthritis, unspecified: Secondary | ICD-10-CM

## 2017-02-11 DIAGNOSIS — D6851 Activated protein C resistance: Secondary | ICD-10-CM | POA: Diagnosis not present

## 2017-02-11 DIAGNOSIS — M1990 Unspecified osteoarthritis, unspecified site: Secondary | ICD-10-CM | POA: Insufficient documentation

## 2017-03-09 ENCOUNTER — Other Ambulatory Visit (INDEPENDENT_AMBULATORY_CARE_PROVIDER_SITE_OTHER): Payer: Self-pay | Admitting: Vascular Surgery

## 2017-03-31 ENCOUNTER — Other Ambulatory Visit (INDEPENDENT_AMBULATORY_CARE_PROVIDER_SITE_OTHER): Payer: Self-pay | Admitting: Vascular Surgery

## 2017-05-28 ENCOUNTER — Encounter: Payer: Self-pay | Admitting: *Deleted

## 2017-05-28 ENCOUNTER — Emergency Department: Payer: Medicaid Other

## 2017-05-28 ENCOUNTER — Emergency Department
Admission: EM | Admit: 2017-05-28 | Discharge: 2017-05-28 | Disposition: A | Discharge status: Home / Self Care | Payer: Medicaid Other | Attending: Student in an Organized Health Care Education/Training Program | Admitting: Student in an Organized Health Care Education/Training Program

## 2017-05-28 DIAGNOSIS — R51 Headache: Secondary | ICD-10-CM | POA: Diagnosis present

## 2017-05-28 DIAGNOSIS — Z7901 Long term (current) use of anticoagulants: Secondary | ICD-10-CM | POA: Insufficient documentation

## 2017-05-28 DIAGNOSIS — R079 Chest pain, unspecified: Secondary | ICD-10-CM | POA: Diagnosis not present

## 2017-05-28 DIAGNOSIS — Y939 Activity, unspecified: Secondary | ICD-10-CM | POA: Diagnosis not present

## 2017-05-28 DIAGNOSIS — Y929 Unspecified place or not applicable: Secondary | ICD-10-CM | POA: Insufficient documentation

## 2017-05-28 DIAGNOSIS — M7918 Myalgia, other site: Secondary | ICD-10-CM

## 2017-05-28 DIAGNOSIS — Y999 Unspecified external cause status: Secondary | ICD-10-CM | POA: Insufficient documentation

## 2017-05-28 DIAGNOSIS — S298XXA Other specified injuries of thorax, initial encounter: Secondary | ICD-10-CM | POA: Diagnosis not present

## 2017-05-28 DIAGNOSIS — Z79899 Other long term (current) drug therapy: Secondary | ICD-10-CM | POA: Insufficient documentation

## 2017-05-28 DIAGNOSIS — Z85828 Personal history of other malignant neoplasm of skin: Secondary | ICD-10-CM | POA: Insufficient documentation

## 2017-05-28 DIAGNOSIS — G44319 Acute post-traumatic headache, not intractable: Secondary | ICD-10-CM | POA: Insufficient documentation

## 2017-05-28 HISTORY — DX: Malignant (primary) neoplasm, unspecified: C80.1

## 2017-05-28 LAB — BASIC METABOLIC PANEL
Anion gap: 10 (ref 5–15)
BUN: 15 mg/dL (ref 6–20)
CHLORIDE: 103 mmol/L (ref 101–111)
CO2: 24 mmol/L (ref 22–32)
CREATININE: 0.79 mg/dL (ref 0.44–1.00)
Calcium: 9.5 mg/dL (ref 8.9–10.3)
GFR calc non Af Amer: >60 mL/min (ref >60)
Glucose, Bld: 100 mg/dL — ABNORMAL HIGH (ref 65–99)
POTASSIUM: 4.5 mmol/L (ref 3.5–5.1)
SODIUM: 137 mmol/L (ref 135–145)

## 2017-05-28 LAB — CBC WITH DIFFERENTIAL/PLATELET
BASOS PCT: 1 %
Basophils Absolute: 0.1 10*3/uL (ref 0–0.1)
EOS ABS: 0.1 10*3/uL (ref 0–0.7)
EOS PCT: 1 %
HCT: 47.6 % — ABNORMAL HIGH (ref 35.0–47.0)
HEMOGLOBIN: 15.8 g/dL (ref 12.0–16.0)
Lymphocytes Relative: 28 %
Lymphs Abs: 2.3 10*3/uL (ref 1.0–3.6)
MCH: 30.5 pg (ref 26.0–34.0)
MCHC: 33.2 g/dL (ref 32.0–36.0)
MCV: 91.9 fL (ref 80.0–100.0)
MONOS PCT: 6 %
Monocytes Absolute: 0.5 10*3/uL (ref 0.2–0.9)
NEUTROS PCT: 64 %
Neutro Abs: 5.3 10*3/uL (ref 1.4–6.5)
PLATELETS: 298 10*3/uL (ref 150–440)
RBC: 5.18 MIL/uL (ref 3.80–5.20)
RDW: 13.6 % (ref 11.5–14.5)
WBC: 8.2 10*3/uL (ref 3.6–11.0)

## 2017-05-28 LAB — URINALYSIS, COMPLETE (UACMP) WITH MICROSCOPIC
BACTERIA UA: NONE SEEN
BILIRUBIN URINE: NEGATIVE
Glucose, UA: NEGATIVE mg/dL
Hgb urine dipstick: NEGATIVE
Ketones, ur: NEGATIVE mg/dL
Leukocytes, UA: NEGATIVE
Nitrite: NEGATIVE
Protein, ur: 30 mg/dL — AB
SPECIFIC GRAVITY, URINE: 1.019 (ref 1.005–1.030)
pH: 5 (ref 5.0–8.0)

## 2017-05-28 LAB — TROPONIN I

## 2017-05-28 LAB — POCT PREGNANCY, URINE: PREG TEST UR: NEGATIVE

## 2017-05-28 MED ORDER — OXYCODONE HCL 5 MG PO TABS: 5.0000 mg | ORAL_TABLET | Freq: Once | ORAL | Status: DC

## 2017-05-28 MED ORDER — CYCLOBENZAPRINE HCL 10 MG PO TABS: 10.0000 mg | ORAL_TABLET | Freq: Three times a day (TID) | ORAL | 0 refills | Status: DC | PRN

## 2017-05-28 MED ORDER — ONDANSETRON HCL 4 MG/2ML IJ SOLN
4.0000 mg | Freq: Once | INTRAMUSCULAR | Status: AC
  Administered 2017-05-28: 4 mg via INTRAVENOUS
  Filled 2017-05-28: qty 2

## 2017-05-28 MED ORDER — IOPAMIDOL (ISOVUE-300) INJECTION 61%
100.0000 mL | Freq: Once | INTRAVENOUS | Status: AC | PRN
  Administered 2017-05-28: 100 mL via INTRAVENOUS
  Filled 2017-05-28: qty 100

## 2017-05-28 MED ORDER — FENTANYL CITRATE (PF) 100 MCG/2ML IJ SOLN
100.0000 ug | Freq: Once | INTRAMUSCULAR | Status: AC
  Administered 2017-05-28: 100 ug via INTRAVENOUS
  Filled 2017-05-28: qty 2
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
## 2017-05-28 NOTE — Discharge Instructions (Signed)
°  Rest and apply ice to sore areas. Follow-up with her primary care provider if you do not like your improving over the week. Return to the emergency department for symptoms that change or worsen if you're unable to schedule an appointment.

## 2017-05-28 NOTE — ED Notes (Signed)
Pt verbalizes understanding of d/c instructions, medications and follow up 
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
## 2018-02-07 ENCOUNTER — Other Ambulatory Visit (INDEPENDENT_AMBULATORY_CARE_PROVIDER_SITE_OTHER): Payer: Self-pay | Admitting: Vascular Surgery

## 2018-02-07 DIAGNOSIS — I82501 Chronic embolism and thrombosis of unspecified deep veins of right lower extremity: Secondary | ICD-10-CM

## 2018-02-10 ENCOUNTER — Ambulatory Visit (INDEPENDENT_AMBULATORY_CARE_PROVIDER_SITE_OTHER): Payer: Medicaid Other

## 2018-02-10 ENCOUNTER — Encounter (INDEPENDENT_AMBULATORY_CARE_PROVIDER_SITE_OTHER): Payer: Self-pay | Admitting: Vascular Surgery

## 2018-02-10 ENCOUNTER — Ambulatory Visit (INDEPENDENT_AMBULATORY_CARE_PROVIDER_SITE_OTHER): Payer: Medicaid Other | Admitting: Vascular Surgery

## 2018-02-10 VITALS — BP 141/89 | HR 62 | Resp 17 | Ht <= 58 in | Wt 216.0 lb

## 2018-02-10 DIAGNOSIS — I87009 Postthrombotic syndrome without complications of unspecified extremity: Secondary | ICD-10-CM

## 2018-02-10 DIAGNOSIS — D6851 Activated protein C resistance: Secondary | ICD-10-CM

## 2018-02-10 DIAGNOSIS — G479 Sleep disorder, unspecified: Secondary | ICD-10-CM | POA: Diagnosis not present

## 2018-02-10 DIAGNOSIS — I82501 Chronic embolism and thrombosis of unspecified deep veins of right lower extremity: Secondary | ICD-10-CM | POA: Diagnosis not present

## 2018-02-10 DIAGNOSIS — D6862 Lupus anticoagulant syndrome: Secondary | ICD-10-CM

## 2018-02-10 NOTE — Progress Notes (Signed)
MRN : [id number]  Caroline Madden is a 37 y.o. (1980-12-10) female who presents with chief complaint of  Chief Complaint  Patient presents with  . Follow-up    55yr ivc filter  .  History of Present Illness: Patient last seen on 02/11/17 referred in the past by Dr. Ginette Pitman for "recurrent lower extremity DVT". Patient endorses a history of recurrent lower extremity DVT's which started about 12 years ago after she became pregnant with her first child. The patient has an IVC filter placed years ago - doesn't remember when. She had been on Xarelto for years as well. Was on Coumadin at one point. States she has had a hematology consult years ago and was diagnosed with Factor V Leiden. Patient most recent diagnosis of right lower extremity DVT on 12/30/15 after right lower extremity edema and pain. Duplex on 12/30/15 was notable for partially occlusive thrombus throughout the femoral and popliteal venous system. Patient states pain and swelling has improved. Denies any SOB or chest pain. Patient is now taking Eliquis. Patient was seen by hematology again - no change in current plan. Patient underwent IVC filter duplex which was noted to be patent. 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  She notes that she sleeps poorly and has intermittent chest pain for which she goes to the ER and has had multiple negative work ups.  She also notes swelling of the leg at night.    Current Meds  Medication Sig  . ELIQUIS 5 MG TABS tablet TAKE 1 TABLET BY MOUTH TWICE PER DAY    Past Medical History:  Diagnosis Date  . Cancer ([place])    skin ca  . DVT (deep venous thrombosis) ([place]) 2002  . Renal calculi     Past Surgical History:  Procedure Laterality Date  . IVP      Social History Social History   Tobacco Use  . Smoking status: Never Smoker  . Smokeless tobacco: Never Used  Substance Use Topics  . Alcohol use: No  . Drug use: No    Family History Family History  Problem Relation Age of Onset  .  Lupus Mother   . Stroke Father   . Parkinson's disease Father   . Hypertension Father   . Gout Father     Allergies  Allergen Reactions  . Penicillin V Potassium Other (See Comments)  . Penicillins     Other reaction(s): RASH  . Strawberry Extract     Other reaction(s): RASH Other reaction(s): RASH  . Wheat Bran     Other reaction(s): OTHER Other reaction(s): OTHER  . Other Rash     REVIEW OF SYSTEMS (Negative unless checked)  Constitutional: [] Weight loss  [] Fever  [] Chills Cardiac: [] Chest pain   [] Chest pressure   [] Palpitations   [] Shortness of breath when laying flat   [] Shortness of breath with exertion. Vascular:  [] Pain in legs with walking   [x] Pain in legs at rest  [x] History of DVT   [] Phlebitis   [x] Swelling in legs   [] Varicose veins   [] Non-healing ulcers Pulmonary:   [] Uses home oxygen   [] Productive cough   [] Hemoptysis   [] Wheeze  [] COPD   [] Asthma Neurologic:  [] Dizziness   [] Seizures   [] History of stroke   [] History of TIA  [] Aphasia   [] Vissual changes   [] Weakness or numbness in arm   [] Weakness or numbness in leg Musculoskeletal:   [] Joint swelling   [] Joint pain   [] Low back pain Hematologic:  [] Easy bruising  []   Easy bleeding   [] Hypercoagulable state   [] Anemic Gastrointestinal:  [] Diarrhea   [] Vomiting  [] Gastroesophageal reflux/heartburn   [] Difficulty swallowing. Genitourinary:  [] Chronic kidney disease   [] Difficult urination  [] Frequent urination   [] Blood in urine Skin:  [] Rashes   [] Ulcers  Psychological:  [] History of anxiety   []  History of major depression.  Physical Examination  Vitals:   02/10/18 0824  BP: (!) 141/89  Pulse: 62  Resp: 17  Weight: 216 lb (98 kg)  Height: 4\' 5"  (1.346 m)   Body mass index is 54.06 kg/m. Gen: WD/WN, NAD Head: [place]/AT, No temporalis wasting.  Ear/Nose/Throat: Hearing grossly intact, nares w/o erythema or drainage Eyes: PER, EOMI, sclera nonicteric.  Neck: Supple, no large masses.   Pulmonary:  Good  air movement, no audible wheezing bilaterally, no use of accessory muscles.  Cardiac: RRR, no JVD Vascular: scattered varicosities present bilaterally.  Mild venous stasis changes to the legs bilaterally.  2+ soft pitting edema Vessel Right Left  Radial Palpable Palpable  PT Palpable Palpable  DP Palpable Palpable  Gastrointestinal: Non-distended. No guarding/no peritoneal signs.  Musculoskeletal: M/S 5/5 throughout.  No deformity or atrophy.  Neurologic: CN 2-12 intact. Symmetrical.  Speech is fluent. Motor exam as listed above. Psychiatric: Judgment intact, Mood & affect appropriate for pt's clinical situation. Dermatologic: No rashes or ulcers noted.  No changes consistent with cellulitis. Lymph : No lichenification or skin changes of chronic lymphedema.  CBC Lab Results  Component Value Date   WBC 8.2 05/28/2017   HGB 15.8 05/28/2017   HCT 47.6 (H) 05/28/2017   MCV 91.9 05/28/2017   PLT 298 05/28/2017    BMET    Component Value Date/Time   NA 137 05/28/2017 0823   NA 140 02/23/2012 2001   K 4.5 05/28/2017 0823   K 4.5 02/23/2012 2001   CL 103 05/28/2017 0823   CL 104 02/23/2012 2001   CO2 24 05/28/2017 0823   CO2 24 02/23/2012 2001   GLUCOSE 100 (H) 05/28/2017 0823   GLUCOSE 111 (H) 02/23/2012 2001   BUN 15 05/28/2017 0823   BUN 16 02/23/2012 2001   CREATININE 0.79 05/28/2017 0823   CREATININE 0.84 02/23/2012 2001   CALCIUM 9.5 05/28/2017 0823   CALCIUM 9.7 02/23/2012 2001   GFRNONAA >60 05/28/2017 0823   GFRNONAA >60 02/23/2012 2001   GFRAA >60 05/28/2017 0823   GFRAA >60 02/23/2012 2001   CrCl cannot be calculated (Patient's most recent lab result is older than the maximum 21 days allowed.).  COAG No results found for: INR, PROTIME  Radiology No results found.    Assessment/Plan 1. Post-phlebitic syndrome No surgery or intervention at this point in time.    I have reviewed my previous discussion with the patient regarding swelling and why it   causes symptoms.  The patient is doing well with compression and will continue wearing graduated compression stockings class 1 (20-30 mmHg) on a daily basis a prescription was given. The patient will  continue wearing the stockings first thing in the morning and removing them in the evening. The patient is instructed specifically not to sleep in the stockings.    In addition, behavioral modification including elevation during the day and exercise will be continued.    Patient should follow-up on an annual basis     2. Sleep disturbance I am concerned about sleep apnea  She did not follow up last year as I had requested. I have asked that if she doesn't hear from  them in a week or two to give [place] a call and let us know.  - Ambulatory referral to Pulmonology  3. Factor V Leiden ([place]) Continue anticoagulation currently on Eliquis    4. Lupus anticoagulant disorder ([place]) Continue anticoagulation     Hortencia Pilar, MD  02/10/2018 8:38 AM

## 2018-04-28 ENCOUNTER — Ambulatory Visit (INDEPENDENT_AMBULATORY_CARE_PROVIDER_SITE_OTHER): Payer: Medicaid Other | Admitting: Vascular Surgery

## 2018-04-28 ENCOUNTER — Ambulatory Visit (INDEPENDENT_AMBULATORY_CARE_PROVIDER_SITE_OTHER): Payer: Medicaid Other

## 2018-04-28 ENCOUNTER — Other Ambulatory Visit (INDEPENDENT_AMBULATORY_CARE_PROVIDER_SITE_OTHER): Payer: Self-pay | Admitting: Vascular Surgery

## 2018-04-28 ENCOUNTER — Encounter (INDEPENDENT_AMBULATORY_CARE_PROVIDER_SITE_OTHER): Payer: Self-pay | Admitting: Vascular Surgery

## 2018-04-28 VITALS — BP 146/97 | HR 80 | Resp 16 | Ht <= 58 in | Wt 219.4 lb

## 2018-04-28 DIAGNOSIS — R29898 Other symptoms and signs involving the musculoskeletal system: Secondary | ICD-10-CM | POA: Diagnosis not present

## 2018-04-28 DIAGNOSIS — D6851 Activated protein C resistance: Secondary | ICD-10-CM

## 2018-04-28 DIAGNOSIS — I890 Lymphedema, not elsewhere classified: Secondary | ICD-10-CM | POA: Insufficient documentation

## 2018-04-28 DIAGNOSIS — I82511 Chronic embolism and thrombosis of right femoral vein: Secondary | ICD-10-CM | POA: Diagnosis not present

## 2018-04-28 DIAGNOSIS — I82411 Acute embolism and thrombosis of right femoral vein: Secondary | ICD-10-CM | POA: Diagnosis not present

## 2018-04-28 NOTE — Progress Notes (Signed)
Subjective:    Patient ID: Caroline Madden, female    DOB: 06/29/1981, 37 y.o.   MRN: [id number] Chief Complaint  Patient presents with  . Follow-up    right leg pain   Patient was last seen on February 10, 2018 in regard to her chronic lower extremity DVT.  The patient presents today after falling twice this past weekend.  The patient notes that the "weakness" she experiences to her bilateral lower extremity has progressively worsened over the last year.  After her 2 falls, the patient has noticed an increase in her right lower extremity swelling and discomfort.  The patient notes that she continues to engage in conservative therapy on a daily basis including wearing medical grade 1 compression socks, elevating her legs and remaining active with minimal control to her bilateral lower extremity edema.  The patient notes that her overall edema has become lifestyle limiting and seems to aggravate the weakness that she experiences to the bilateral legs.  The patient is concerned that she is on a blood thinner and notes that she does fall "a lot".  The patient states that she has not undergone any imaging of her spine her joints.  The patient underwent a right lower extremity DVT study which was notable for a mixture of acute and chronic DVT in the proximal mid and distal femoral vein.  No DVT noted to the remaining venous system of the right lower extremity.  The patient continues to take Eliquis on a daily basis.  The patient has a IVC filter in place.  The patient denies any ulceration to the right lower extremity.  Patient denies any cellulitis to the right lower extremity.  Patient denies any shortness of breath or chest pain.  Patient denies any fever, nausea vomiting.  Review of Systems  Constitutional: Negative.   HENT: Negative.   Eyes: Negative.   Respiratory: Negative.   Cardiovascular: Positive for leg swelling.       RLE DVT Lympehdema  Gastrointestinal: Negative.   Endocrine: Negative.     Genitourinary: Negative.   Musculoskeletal: Negative.   Skin: Negative.   Allergic/Immunologic: Negative.   Neurological: Negative.   Hematological: Negative.   Psychiatric/Behavioral: Negative.       Objective:   Physical Exam  Constitutional: She is oriented to person, place, and time. She appears well-developed and well-nourished. No distress.  HENT:  Head: Normocephalic and atraumatic.  Right Ear: External ear normal.  Left Ear: External ear normal.  Eyes: Pupils are equal, round, and reactive to light. Conjunctivae and EOM are normal.  Neck: Normal range of motion.  Cardiovascular: Normal rate, regular rhythm, normal heart sounds and intact distal pulses.  Pulmonary/Chest: Effort normal and breath sounds normal.  Musculoskeletal: Normal range of motion. She exhibits edema (Right lower extremity moderately edematous.  Left lower extremity mild edema.).  Neurological: She is alert and oriented to person, place, and time.  Skin: She is not diaphoretic.  There is no cellulitis, fibrosis, stasis dermatitis or active ulcerations noted at this time.  Psychiatric: She has a normal mood and affect. Her behavior is normal. Judgment and thought content normal.  Vitals reviewed.  BP (!) 146/97 (BP Location: Right Arm)   Pulse 80   Resp 16   Ht 4\' 5"  (1.346 m)   Wt 219 lb 6.4 oz (99.5 kg)   BMI 54.91 kg/m   Past Medical History:  Diagnosis Date  . Cancer ([place])    skin ca  . DVT (deep venous  thrombosis) ([place]) 2002  . Renal calculi    Social History   Socioeconomic History  . Marital status: Single    Spouse name: Not on file  . Number of children: Not on file  . Years of education: Not on file  . Highest education level: Not on file  Occupational History  . Not on file  Social Needs  . Financial resource strain: Not on file  . Food insecurity:    Worry: Not on file    Inability: Not on file  . Transportation needs:    Medical: Not on file    Non-medical: Not on  file  Tobacco Use  . Smoking status: Never Smoker  . Smokeless tobacco: Never Used  Substance and Sexual Activity  . Alcohol use: No  . Drug use: No  . Sexual activity: Not on file  Lifestyle  . Physical activity:    Days per week: Not on file    Minutes per session: Not on file  . Stress: Not on file  Relationships  . Social connections:    Talks on phone: Not on file    Gets together: Not on file    Attends religious service: Not on file    Active member of club or organization: Not on file    Attends meetings of clubs or organizations: Not on file    Relationship status: Not on file  . Intimate partner violence:    Fear of current or ex partner: Not on file    Emotionally abused: Not on file    Physically abused: Not on file    Forced sexual activity: Not on file  Other Topics Concern  . Not on file  Social History Narrative  . Not on file   Past Surgical History:  Procedure Laterality Date  . IVP     Family History  Problem Relation Age of Onset  . Lupus Mother   . Stroke Father   . Parkinson's disease Father   . Hypertension Father   . Gout Father    Allergies  Allergen Reactions  . Penicillin V Potassium Other (See Comments)  . Penicillins     Other reaction(s): RASH  . Strawberry Extract     Other reaction(s): RASH Other reaction(s): RASH  . Wheat Bran     Other reaction(s): OTHER Other reaction(s): OTHER  . Other Rash      Assessment & Plan:  Patient was last seen on February 10, 2018 in regard to her chronic lower extremity DVT.  The patient presents today after falling twice this past weekend.  The patient notes that the "weakness" she experiences to her bilateral lower extremity has progressively worsened over the last year.  After her 2 falls, the patient has noticed an increase in her right lower extremity swelling and discomfort.  The patient notes that she continues to engage in conservative therapy on a daily basis including wearing medical grade  1 compression socks, elevating her legs and remaining active with minimal control to her bilateral lower extremity edema.  The patient notes that her overall edema has become lifestyle limiting and seems to aggravate the weakness that she experiences to the bilateral legs.  The patient is concerned that she is on a blood thinner and notes that she does fall "a lot".  The patient states that she has not undergone any imaging of her spine her joints.  The patient underwent a right lower extremity DVT study which was notable for a mixture of acute  and chronic DVT in the proximal mid and distal femoral vein.  No DVT noted to the remaining venous system of the right lower extremity.  The patient continues to take Eliquis on a daily basis.  The patient has a IVC filter in place.  The patient denies any ulceration to the right lower extremity.  Patient denies any cellulitis to the right lower extremity.  Patient denies any shortness of breath or chest pain.  Patient denies any fever, nausea vomiting.  1. Factor V Leiden ([place]) - Stable Patient has a history of recurrent DVTs. The patient is currently taking Eliquis 5 mg 1 tab twice daily Patient does have a IVC filter in place indefinitely.  2. Lymphedema - Stable Despite conservative treatments including exercise, elevation and class I compression stockings the patient still presents with stage I lymphedema. The patient feels that her symptoms have progressed to the point that she is unable to function on a daily basis The patient has failed conservative therapy and the addition of lymphedema pump will be beneficial Patient is to follow-up in approximately 4 months so I can assess her progress with conservative therapy and the addition of lymphedema pump  3. Weakness of both lower extremities - Stable Patient was encouraged to follow-up with her primary care physician for the progressively worsening weakness in her legs. She is on a blood thinner and  frequent falls is concerning.  Current Outpatient Medications on File Prior to Visit  Medication Sig Dispense Refill  . ELIQUIS 5 MG TABS tablet TAKE 1 TABLET BY MOUTH TWICE PER DAY 60 tablet 5  . cyclobenzaprine (FLEXERIL) 10 MG tablet Take 1 tablet (10 mg total) 3 (three) times daily as needed by mouth for muscle spasms. (Patient not taking: Reported on 02/10/2018) 30 tablet 0  . ELIQUIS 5 MG TABS tablet TAKE 1 TABLET BY MOUTH TWICE PER DAY (Patient not taking: Reported on 02/10/2018) 60 tablet 5  . phentermine 37.5 MG capsule TAKE ONE CAPSULE BY MOUTH EVERY MORNING BEFORE BREAKFAST     No current facility-administered medications on file prior to visit.    There are no Patient Instructions on file for this visit. No follow-ups on file.  Normand Damron A Hareem Surowiec, PA-C

## 2018-04-29 ENCOUNTER — Other Ambulatory Visit: Payer: Self-pay | Admitting: Internal Medicine

## 2018-04-29 DIAGNOSIS — M4807 Spinal stenosis, lumbosacral region: Secondary | ICD-10-CM

## 2018-05-17 ENCOUNTER — Ambulatory Visit
Admission: RE | Admit: 2018-05-17 | Discharge: 2018-05-17 | Disposition: A | Discharge status: Home / Self Care | Payer: Medicaid Other | Source: Ambulatory Visit | Attending: Internal Medicine | Admitting: Internal Medicine

## 2018-05-19 ENCOUNTER — Other Ambulatory Visit: Payer: Self-pay | Admitting: Orthopedic Surgery

## 2018-05-19 DIAGNOSIS — G8929 Other chronic pain: Secondary | ICD-10-CM

## 2018-05-19 DIAGNOSIS — M5441 Lumbago with sciatica, right side: Principal | ICD-10-CM

## 2018-05-23 ENCOUNTER — Telehealth (INDEPENDENT_AMBULATORY_CARE_PROVIDER_SITE_OTHER): Payer: Self-pay

## 2018-05-23 NOTE — Telephone Encounter (Signed)
I have spoken with Santiago Glad. Thanks

## 2018-05-23 NOTE — Telephone Encounter (Signed)
MRI tech called to see if she can get clearance on this patient to have her MRI done today. She says that the patient has told her that she has a stent and a IVC filter and that we should have the notes for those even though they were not performed here with [facility].  Santiago Glad is at [phone]

## 2018-05-24 ENCOUNTER — Other Ambulatory Visit (INDEPENDENT_AMBULATORY_CARE_PROVIDER_SITE_OTHER): Payer: Self-pay | Admitting: Vascular Surgery

## 2018-06-03 ENCOUNTER — Ambulatory Visit: Payer: Medicaid Other

## 2018-06-03 ENCOUNTER — Ambulatory Visit
Admission: RE | Admit: 2018-06-03 | Discharge: 2018-06-03 | Disposition: A | Discharge status: Home / Self Care | Payer: Medicaid Other | Source: Ambulatory Visit | Attending: Orthopedic Surgery | Admitting: Orthopedic Surgery

## 2018-06-03 DIAGNOSIS — M7138 Other bursal cyst, other site: Secondary | ICD-10-CM | POA: Insufficient documentation

## 2018-06-03 DIAGNOSIS — G8929 Other chronic pain: Secondary | ICD-10-CM | POA: Insufficient documentation

## 2018-06-03 DIAGNOSIS — M5441 Lumbago with sciatica, right side: Secondary | ICD-10-CM | POA: Insufficient documentation

## 2018-06-09 ENCOUNTER — Other Ambulatory Visit: Payer: Self-pay | Admitting: Orthopedic Surgery

## 2018-06-09 DIAGNOSIS — N9489 Other specified conditions associated with female genital organs and menstrual cycle: Secondary | ICD-10-CM

## 2018-06-11 ENCOUNTER — Ambulatory Visit: Payer: Medicaid Other

## 2018-06-16 ENCOUNTER — Ambulatory Visit: Payer: Medicaid Other

## 2018-06-17 ENCOUNTER — Other Ambulatory Visit: Payer: Self-pay | Admitting: Internal Medicine

## 2018-06-17 DIAGNOSIS — N9489 Other specified conditions associated with female genital organs and menstrual cycle: Secondary | ICD-10-CM

## 2018-06-23 ENCOUNTER — Ambulatory Visit
Admission: RE | Admit: 2018-06-23 | Discharge: 2018-06-23 | Disposition: A | Discharge status: Home / Self Care | Payer: Medicaid Other | Source: Ambulatory Visit | Attending: Internal Medicine | Admitting: Internal Medicine

## 2018-06-23 DIAGNOSIS — N9489 Other specified conditions associated with female genital organs and menstrual cycle: Secondary | ICD-10-CM | POA: Diagnosis present

## 2018-06-23 DIAGNOSIS — D259 Leiomyoma of uterus, unspecified: Secondary | ICD-10-CM | POA: Insufficient documentation

## 2018-07-02 ENCOUNTER — Ambulatory Visit (INDEPENDENT_AMBULATORY_CARE_PROVIDER_SITE_OTHER): Payer: Medicaid Other | Admitting: Obstetrics & Gynecology

## 2018-07-02 ENCOUNTER — Encounter: Payer: Self-pay | Admitting: Obstetrics & Gynecology

## 2018-07-02 VITALS — BP 120/70 | Ht <= 58 in | Wt 122.0 lb

## 2018-07-02 DIAGNOSIS — D219 Benign neoplasm of connective and other soft tissue, unspecified: Secondary | ICD-10-CM | POA: Diagnosis not present

## 2018-07-02 DIAGNOSIS — N9489 Other specified conditions associated with female genital organs and menstrual cycle: Secondary | ICD-10-CM

## 2018-07-02 NOTE — Progress Notes (Signed)
Consultant:  Dr Tracie Harrier Consulting Reason:  Adnexal mass  HPI: Patient is a 37 y.o. G1P1001 who LMP was No LMP recorded. Patient has had an ablation., presents today for a problem visit.  She complains of recent findings of Right ovarion cyst and Left adnexal mass by Ultrasound - Pelvic Vaginal, MRI - Pelvis.  Pt has had symptoms of longstanding low back pain and more recently right soded pain and weakness, even numbness, with right leg involvement; has had falls as well.  This led to [facility] for MS reasons, and then [place] in follow up to its findings of left 6cm pelvic mass as well as small right ovarian cyst and small fibroid.  Pt also reports frequent urination.  Pt is s/p ablation and has rare but occas vag bleeding.  She is on blood thinner (14 years) for prior DVT from pregnancy then to now.  One prior NSVD.  Contraception- vasec.  Also reports occas bloating, no other sx's..  Previous evaluation: PCP, Vasc, other. Prior Diagnosis: none. Previous Treatment: none.  PMHx: She  has a past medical history of Cancer ([place]), DVT (deep venous thrombosis) ([place]) (2002), and Renal calculi. Also,  has a past surgical history that includes IVP., family history includes Cancer in her maternal grandmother; Gout in her father; Hypertension in her father; Lupus in her mother; Parkinson's disease in her father; Stroke in her father.,  reports that she has never smoked. She has never used smokeless tobacco. She reports that she does not drink alcohol or use drugs.  She has a current medication list which includes the following prescription(s): gabapentin, cyclobenzaprine, eliquis, eliquis, and phentermine. Also, is allergic to penicillin v potassium; penicillins; strawberry extract; wheat bran; and other.  Review of Systems  Constitutional: Positive for malaise/fatigue. Negative for chills and fever.  HENT: Negative for congestion, sinus pain and sore throat.   Eyes: Negative for blurred vision and pain.    Respiratory: Negative for cough and wheezing.   Cardiovascular: Negative for chest pain and leg swelling.  Gastrointestinal: Negative for abdominal pain, constipation, diarrhea, heartburn, nausea and vomiting.  Genitourinary: Positive for frequency. Negative for dysuria, hematuria and urgency.  Musculoskeletal: Positive for joint pain. Negative for back pain, myalgias and neck pain.  Skin: Positive for rash. Negative for itching.  Neurological: Positive for dizziness, tingling, weakness and headaches. Negative for tremors.  Endo/Heme/Allergies: Bruises/bleeds easily.  Psychiatric/Behavioral: Positive for depression. The patient is not nervous/anxious and does not have insomnia.     Objective: BP 120/70   Ht 4\' 8"  (1.422 m)   Wt 122 lb (55.3 kg)   BMI 27.35 kg/m  Physical Exam Constitutional:      General: She is not in acute distress.    Appearance: She is obese.  Genitourinary:     Pelvic exam was performed with patient supine.     Vagina, uterus and rectum normal.     No lesions in the vagina.     No vaginal bleeding.     No cervical motion tenderness, friability, lesion or polyp.     Uterus is mobile.     Uterus is not enlarged.     No uterine mass detected.    Uterus is midaxial.     No right or left adnexal mass present.     Right adnexa not tender.     Left adnexa not tender.     Genitourinary Comments: Exam limited by obesity  HENT:     Head: Normocephalic and atraumatic. No laceration.  Right Ear: Hearing normal.     Left Ear: Hearing normal.     Mouth/Throat:     Pharynx: Uvula midline.  Eyes:     Pupils: Pupils are equal, round, and reactive to light.  Neck:     Musculoskeletal: Normal range of motion and neck supple.     Thyroid: No thyromegaly.  Cardiovascular:     Rate and Rhythm: Normal rate and regular rhythm.     Heart sounds: No murmur. No friction rub. No gallop.   Pulmonary:     Effort: Pulmonary effort is normal. No respiratory distress.      Breath sounds: Normal breath sounds. No wheezing.  Chest:     Breasts:        Right: No mass, skin change or tenderness.        Left: No mass, skin change or tenderness.  Abdominal:     General: Bowel sounds are normal. There is no distension.     Palpations: Abdomen is soft.     Tenderness: There is no abdominal tenderness. There is no rebound.  Musculoskeletal: Normal range of motion.  Neurological:     Mental Status: She is alert and oriented to person, place, and time.     Cranial Nerves: No cranial nerve deficit.  Skin:    General: Skin is warm and dry.  Psychiatric:        Judgment: Judgment normal.  Vitals signs reviewed.   TRANSABDOMINAL AND TRANSVAGINAL ULTRASOUND OF PELVIS TECHNIQUE: Both transabdominal and transvaginal ultrasound examinations of the pelvis were performed. Transabdominal technique was performed for global imaging of the pelvis including uterus, ovaries, adnexal regions, and pelvic cul-de-sac. It was necessary to proceed with endovaginal exam following the transabdominal exam to visualize the uterus endometrium and ovaries.  COMPARISON:  Lumbar MRI 06/03/2018  FINDINGS: Uterus  Measurements: 7.1 x 3.8 x 4.3 cm = volume: 59.5 mL. Right anterior intramural mass measuring 2.3 x 1.3 x 1.3 cm.  Endometrium  Thickness: 6.2 mm.  No focal abnormality visualized.  Right ovary  Measurements: 3.4 x 2.5 x 2.5 cm = volume: 11.9 mL. Cyst in the right ovary measuring 2.9 x 2.2 x 2.3 cm  Left ovary  Measurements: 3.7 x 2.7 x 2.8 cm = volume: 14.4 mL. Large exophytic cyst measuring 5.9 x 3.9 x 4.5 cm, corresponding to MRI abnormality.  Other findings  No abnormal free fluid.  IMPRESSION: 1. 5.9 cm left adnexal cyst corresponding to the MRI abnormality. This has benign characteristics. No imaging follow up is required for premenopausal females. This follows consensus guidelines: Simple Adnexal Cysts: SRU Consensus Conference Update on  Follow-up and Reporting. Radiology 2019; [phone]. 2. Uterine fibroid  ASSESSMENT/PLAN:    Problem List Items Addressed This Visit      Other   Adnexal mass - Primary   Fibroid    CA125 Monitoring and repeat US as may be incidental and resolvable cyst/mass on left side.  Fibroid small and not related to sx's other than possible bladder pressure and thus frequency.  If cyst/mass grows or becomes more symptomatic on that left side then surgery considered.  Risks of surgery and need for anticoagulation management discussed.  Barnett Applebaum, MD, [facility], [facility] 07/02/2018  3:34 PM

## 2018-07-02 NOTE — Patient Instructions (Signed)
Pelvic Mass, Female  A pelvic mass is an abnormal growth in the pelvis. The pelvis is the area between your hip bones. It includes the bladder, rectum, uterus, and ovaries. A pelvic mass may be found during a routine pelvic exam or while performing an MRI, CT scan, or ultrasound for other problems of the abdomen. What are common types of pelvic masses? Pelvic masses include:  Ovarian cysts. These are fluid-filled sacs that form on an ovary.  Tumors. These may be cancerous (malignant) or noncancerous (benign). Noncancerous tumors in the uterus are called uterine fibroids.  Ectopic pregnancy. This is when the fertilized egg attaches (implants) outside the uterus.  Infections. What type of testing may be needed? Your health care provider may recommend that you have tests to diagnose the cause of the pelvic mass. The following tests may be done if a pelvic mass is found:  Physical exam.  Blood tests.  X-rays.  Ultrasound.  CT scan.  MRI.  A surgery to look inside your abdomen with cameras (laparoscopy).  A biopsy that is performed with a needle or during laparoscopy or surgery. In some cases, what seemed like a pelvic mass may actually be something else, such as a mass in one of the organs that is near the pelvis, an infection (abscess), or scar tissue (adhesions) that formed after a surgery. Tests and physical exams may be done once, or they may be done regularly for a period of time. Tests and exams that are done regularly will help monitor whether the mass or tissue change is growing and becoming a concern. What are common treatments? Treatment is not always needed for this condition. Your health care provider may recommend careful monitoring (watchful waiting) and regular tests and exams. Treatment will depend on the cause of the mass. Follow these instructions at home:  What you need to do at home will depend on the cause of the mass. Follow the instructions that your health  care provider gives to you. In general: ? Keep all follow-up visits as directed by your health care provider. This is important. ? Take over-the-counter and prescription medicines only as directed by your health care provider. ? If you were prescribed an antibiotic medicine, take it as told by your health care provider. Do not stop taking the antibiotic even if you start to feel better. ? Follow any restrictions that are given to you by your health care provider.  Try to stay calm, and be sure to ask questions. Make sure you understand the recommendations for monitoring and whether there is a reason for concern. Contact a health care provider if you:  Develop new symptoms.  Note changes in the size, shape, or position of your mass.  Are unable to have a bowel movement.  Bruise or bleed easily. Get help right away if you:  Vomit bright red blood or vomit material that looks like coffee grounds.  Have blood in your stools, or the stools turn black and tarry.  Have an abnormal or increased amount of vaginal bleeding.  Have a fever or chills.  Develop sudden or worsening pain that is not relieved by medicine.  Feel dizzy or weak.  Feel light-headed or you faint.  Feel that the mass has suddenly gotten larger.  Develop severe bloating in your abdomen or your pelvis.  Cannot pass any urine. Summary  A pelvic mass is an abnormal growth in the pelvis. The pelvis is the area between your hip bones. It includes the bladder, rectum,  uterus, and ovaries.  Pelvic masses include ovarian cysts, tumors, ectopic pregnancy, or infections.  Your health care provider may recommend that you have tests to diagnose the cause of the pelvic mass.  Treatment will depend on the cause of the mass. This information is not intended to replace advice given to you by your health care provider. Make sure you discuss any questions you have with your health care provider. Document Released: 10/09/2006  Document Revised: 07/24/2017 Document Reviewed: 07/24/2017 Elsevier Interactive Patient Education  2019 Elsevier Inc.  

## 2018-07-03 LAB — CA 125: Cancer Antigen (CA) 125: 14.9 U/mL (ref 0.0–38.1)

## 2018-08-06 ENCOUNTER — Ambulatory Visit (INDEPENDENT_AMBULATORY_CARE_PROVIDER_SITE_OTHER): Payer: Medicaid Other

## 2018-08-06 ENCOUNTER — Ambulatory Visit (INDEPENDENT_AMBULATORY_CARE_PROVIDER_SITE_OTHER): Payer: Medicaid Other | Admitting: Obstetrics & Gynecology

## 2018-08-06 ENCOUNTER — Encounter: Payer: Self-pay | Admitting: Obstetrics & Gynecology

## 2018-08-06 VITALS — BP 130/80 | Ht <= 58 in | Wt 222.0 lb

## 2018-08-06 DIAGNOSIS — N9489 Other specified conditions associated with female genital organs and menstrual cycle: Secondary | ICD-10-CM | POA: Diagnosis not present

## 2018-08-06 DIAGNOSIS — N8302 Follicular cyst of left ovary: Secondary | ICD-10-CM | POA: Diagnosis not present

## 2018-08-06 DIAGNOSIS — D250 Submucous leiomyoma of uterus: Secondary | ICD-10-CM

## 2018-08-06 DIAGNOSIS — N83292 Other ovarian cyst, left side: Secondary | ICD-10-CM

## 2018-08-06 DIAGNOSIS — N83202 Unspecified ovarian cyst, left side: Secondary | ICD-10-CM | POA: Diagnosis not present

## 2018-08-06 DIAGNOSIS — D219 Benign neoplasm of connective and other soft tissue, unspecified: Secondary | ICD-10-CM

## 2018-08-06 NOTE — Progress Notes (Signed)
HPI: Pt mostly reports bladder pressure w urinary freq and nocturia over the past 2 weeks.  No midline or pelvic pain.  One episode of dark bleeding.  Prior history of cyst and small fibroid seen on [place].  Ultrasound demonstrates LEFT OVARIAN CYST, small fibroid, see below. These findings are not changed from last [place] 6 weeks ago  PMHx: She  has a past medical history of Cancer ([place]), DVT (deep venous thrombosis) ([place]) (2002), and Renal calculi. Also,  has a past surgical history that includes IVP., family history includes Cancer in her maternal grandmother; Gout in her father; Hypertension in her father; Lupus in her mother; Parkinson's disease in her father; Stroke in her father.,  reports that she has never smoked. She has never used smokeless tobacco. She reports that she does not drink alcohol or use drugs.  She has a current medication list which includes the following prescription(s): eliquis, cyclobenzaprine, gabapentin, and phentermine. Also, is allergic to penicillin v potassium; penicillins; strawberry extract; wheat bran; and other.  Review of Systems  All other systems reviewed and are negative.  Objective: BP 130/80   Ht 4\' 8"  (1.422 m)   Wt 222 lb (100.7 kg)   BMI 49.77 kg/m   Physical examination Constitutional NAD, Conversant  Skin No rashes, lesions or ulceration.   Extremities: Moves all appropriately.  Normal ROM for age. No lymphadenopathy.  Neuro: Grossly intact  Psych: Oriented to PPT.  Normal mood. Normal affect.   US Pelvis Transvanginal Non-ob (tv Only)  Result Date: 08/06/2018 Patient Name: Caroline Madden DOB: 05/23/1981 MRN: [id number] ULTRASOUND REPORT Location: [facility] Date of Service: 08/06/2018 Indications: Bleeding and pain Findings: The uterus is anteverted and measures 8.1 x 4.4 x 3.7cm. Echo texture is heterogenous with evidence of focal mass. Within the uterus is one suspected fibroid vs heterogeneous tissue measuring: Fibroid 1:  0.9 x 0.9cm  (fundal, SM) Prior anterior fibroid is not evident on today's ultrasound. Anterior myometrium appears heterogeneous without discrete fibroid. The Endometrium measures 4.7 mm with trace amount of fluid within endometrium/endocervical canal. Right Ovary measures 2.3 x 1.9 x 1.9cm. It is normal in appearance. Left Ovary measures 3.2 x 2.2 x 3.2cm with multiple small dominant follicles and large para-ovarian cyst measuring 6.0 x 3.9 x 3.8cm (unchanged from prior). Survey of the adnexa demonstrates no adnexal masses. There is no free fluid in the cul de sac. Impression: 1. Heterogeneous uterus with questionable one small fundal submucosal fibroid. 2. 6.0cm left para-ovarian cyst similar to prior imaging. Recommendations: 1.Clinical correlation with the patient's History and Physical Exam. Caroline Madden, RDMS RVT Review of ULTRASOUND.    I have personally reviewed images and report of recent ultrasound done at [facility].    Plan of management to be discussed with patient. Caroline Applebaum, MD, [facility], [facility] 08/06/2018  9:15 AM   Assessment:  Adnexal mass Left ovarian cyst    Options for management discussed.  As there is no change in cyst size and no change in pain, would recommend exp mgt w follow up based on sx's or in 6 mos w [place].  Also she is a higher surgical risk and this is discussed.    Monitor bladder sx's.  Unable to give urine sample today.  WIll return to have checked if continued freq nocitria occurs, and also if dysuria develops.  A total of 15 minutes were spent face-to-face with the patient during this encounter and over half of that time dealt  with counseling and coordination of care.  Caroline Applebaum, MD, [facility], [facility] 08/06/2018  9:16 AM

## 2018-08-06 NOTE — Patient Instructions (Signed)
Ovarian Cyst         An ovarian cyst is a fluid-filled sac that forms on an ovary. The ovaries are small organs that produce eggs in women. Various types of cysts can form on the ovaries. Some may cause symptoms and require treatment. Most ovarian cysts go away on their own, are not cancerous (are benign), and do not cause problems.  Common types of ovarian cysts include:  · Functional (follicle) cysts.  ? Occur during the menstrual cycle, and usually go away with the next menstrual cycle if you do not get pregnant.  ? Usually cause no symptoms.  · Endometriomas.  ? Are cysts that form from the tissue that lines the uterus (endometrium).  ? Are sometimes called “chocolate cysts” because they become filled with blood that turns brown.  ? Can cause pain in the lower abdomen during intercourse and during your period.  · Cystadenoma cysts.  ? Develop from cells on the outside surface of the ovary.  ? Can get very large and cause lower abdomen pain and pain with intercourse.  ? Can cause severe pain if they twist or break open (rupture).  · Dermoid cysts.  ? Are sometimes found in both ovaries.  ? May contain different kinds of body tissue, such as skin, teeth, hair, or cartilage.  ? Usually do not cause symptoms unless they get very big.  · Theca lutein cysts.  ? Occur when too much of a certain hormone (human chorionic gonadotropin) is produced and overstimulates the ovaries to produce an egg.  ? Are most common after having procedures used to assist with the conception of a baby (in vitro fertilization).  What are the causes?  Ovarian cysts may be caused by:  · Ovarian hyperstimulation syndrome. This is a condition that can develop from taking fertility medicines. It causes multiple large ovarian cysts to form.  · Polycystic ovarian syndrome (PCOS). This is a common hormonal disorder that can cause ovarian cysts, as well as problems with your period or fertility.  What increases the risk?  The following factors may  make you more likely to develop ovarian cysts:  · Being overweight or obese.  · Taking fertility medicines.  · Taking certain forms of hormonal birth control.  · Smoking.  What are the signs or symptoms?  Many ovarian cysts do not cause symptoms. If symptoms are present, they may include:  · Pelvic pain or pressure.  · Pain in the lower abdomen.  · Pain during sex.  · Abdominal swelling.  · Abnormal menstrual periods.  · Increasing pain with menstrual periods.  How is this diagnosed?  These cysts are commonly found during a routine pelvic exam. You may have tests to find out more about the cyst, such as:  · Ultrasound.  · X-ray of the pelvis.  · CT scan.  · MRI.  · Blood tests.  How is this treated?  Many ovarian cysts go away on their own without treatment. Your health care provider may want to check your cyst regularly for 2-3 months to see if it changes. If you are in menopause, it is especially important to have your cyst monitored closely because menopausal women have a higher rate of ovarian cancer.  When treatment is needed, it may include:  · Medicines to help relieve pain.  · A procedure to drain the cyst (aspiration).  · Surgery to remove the whole cyst.  · Hormone treatment or birth control pills. These methods are sometimes used   to help dissolve a cyst.  Follow these instructions at home:  · Take over-the-counter and prescription medicines only as told by your health care provider.  · Do not drive or use heavy machinery while taking prescription pain medicine.  · Get regular pelvic exams and Pap tests as often as told by your health care provider.  · Return to your normal activities as told by your health care provider. Ask your health care provider what activities are safe for you.  · Do not use any products that contain nicotine or tobacco, such as cigarettes and e-cigarettes. If you need help quitting, ask your health care provider.  · Keep all follow-up visits as told by your health care provider.  This is important.  Contact a health care provider if:  · Your periods are late, irregular, or painful, or they stop.  · You have pelvic pain that does not go away.  · You have pressure on your bladder or trouble emptying your bladder completely.  · You have pain during sex.  · You have any of the following in your abdomen:  ? A feeling of fullness.  ? Pressure.  ? Discomfort.  ? Pain that does not go away.  ? Swelling.  · You feel generally ill.  · You become constipated.  · You lose your appetite.  · You develop severe acne.  · You start to have more body hair and facial hair.  · You are gaining weight or losing weight without changing your exercise and eating habits.  · You think you may be pregnant.  Get help right away if:  · You have abdominal pain that is severe or gets worse.  · You cannot eat or drink without vomiting.  · You suddenly develop a fever.  · Your menstrual period is much heavier than usual.  This information is not intended to replace advice given to you by your health care provider. Make sure you discuss any questions you have with your health care provider.  Document Released: 07/02/2005 Document Revised: 01/20/2016 Document Reviewed: 12/04/2015  Elsevier Interactive Patient Education © 2019 [employer]vier Inc.

## 2018-09-01 ENCOUNTER — Encounter (INDEPENDENT_AMBULATORY_CARE_PROVIDER_SITE_OTHER): Payer: Self-pay | Admitting: Vascular Surgery

## 2018-09-01 ENCOUNTER — Ambulatory Visit (INDEPENDENT_AMBULATORY_CARE_PROVIDER_SITE_OTHER): Payer: Medicaid Other | Admitting: Vascular Surgery

## 2018-09-01 ENCOUNTER — Other Ambulatory Visit: Payer: Self-pay

## 2018-09-01 VITALS — BP 145/82 | HR 84 | Resp 10 | Ht <= 58 in | Wt 221.0 lb

## 2018-09-01 DIAGNOSIS — D6851 Activated protein C resistance: Secondary | ICD-10-CM

## 2018-09-01 DIAGNOSIS — I82521 Chronic embolism and thrombosis of right iliac vein: Secondary | ICD-10-CM | POA: Diagnosis not present

## 2018-09-01 DIAGNOSIS — I890 Lymphedema, not elsewhere classified: Secondary | ICD-10-CM

## 2018-09-01 NOTE — Progress Notes (Signed)
Subjective:    Patient ID: Caroline Madden, female    DOB: [birth year], 38 y.o.   MRN: [id number] Chief Complaint  Patient presents with  . Follow-up   Patient presents for a four-month lymphedema/DVT follow-up.  The patient does have a past medical history of factor V Leiden and DVT to the right lower extremity.  Since our last visit, the patient continues to engage in conservative therapy including wearing medical grade 1 compression socks, elevating her legs and remaining active.  The patient has started using a lymphedema pump which she uses once a day towards the evening for an hour.  The patient notes a market improvement in the edema and discomfort to her bilateral legs however is still experiencing a bluish tint to her bilateral toes.  The patient notes that her bilateral feet are "always cold".  The patient continues to take Eliquis and have an IVC filter in place indefinitely.  Patient denies any shortness of breath or chest pain.  Denies any ulcer formation to the bilateral lower extremity.  Patient denies any fever, nausea vomiting.  Review of Systems  Constitutional: Negative.   HENT: Negative.   Eyes: Negative.   Respiratory: Negative.   Cardiovascular: Positive for leg swelling.       DVT  Gastrointestinal: Negative.   Endocrine: Negative.   Genitourinary: Negative.   Musculoskeletal: Negative.   Skin: Negative.   Allergic/Immunologic: Negative.   Neurological: Negative.   Hematological: Negative.   Psychiatric/Behavioral: Negative.       Objective:   Physical Exam Vitals signs reviewed.  Constitutional:      Appearance: Normal appearance.  HENT:     Head: Normocephalic and atraumatic.     Right Ear: External ear normal.     Left Ear: External ear normal.     Nose: Nose normal.     Mouth/Throat:     Mouth: Mucous membranes are moist.     Pharynx: Oropharynx is clear.  Eyes:     Extraocular Movements: Extraocular movements intact.     Conjunctiva/sclera:  Conjunctivae normal.     Pupils: Pupils are equal, round, and reactive to light.  Neck:     Musculoskeletal: Normal range of motion.  Cardiovascular:     Rate and Rhythm: Normal rate and regular rhythm.     Comments: Hard to palpate pedal pulses to the bilateral feet Pulmonary:     Effort: Pulmonary effort is normal.     Breath sounds: Normal breath sounds.  Musculoskeletal: Normal range of motion.        General: Swelling (Mild nonpitting bilateral lower extremity edema) present.  Skin:    General: Skin is warm and dry.  Neurological:     General: No focal deficit present.     Mental Status: She is alert and oriented to person, place, and time. Mental status is at baseline.  Psychiatric:        Mood and Affect: Mood normal.        Behavior: Behavior normal.        Thought Content: Thought content normal.        Judgment: Judgment normal.    BP (!) 145/82 (BP Location: Left Arm, Patient Position: Sitting, Cuff Size: Large)   Pulse 84   Resp 10   Ht 4\' 8"  (1.422 m)   Wt 221 lb (100.2 kg)   BMI 49.55 kg/m   Past Medical History:  Diagnosis Date  . Cancer ([place])    skin ca  . DVT (  deep venous thrombosis) ([place]) 2002  . Renal calculi    Social History   Socioeconomic History  . Marital status: Single    Spouse name: Not on file  . Number of children: Not on file  . Years of education: Not on file  . Highest education level: Not on file  Occupational History  . Not on file  Social Needs  . Financial resource strain: Not on file  . Food insecurity:    Worry: Not on file    Inability: Not on file  . Transportation needs:    Medical: Not on file    Non-medical: Not on file  Tobacco Use  . Smoking status: Never Smoker  . Smokeless tobacco: Never Used  Substance and Sexual Activity  . Alcohol use: No  . Drug use: No  . Sexual activity: Yes  Lifestyle  . Physical activity:    Days per week: Not on file    Minutes per session: Not on file  . Stress: Not on file    Relationships  . Social connections:    Talks on phone: Not on file    Gets together: Not on file    Attends religious service: Not on file    Active member of club or organization: Not on file    Attends meetings of clubs or organizations: Not on file    Relationship status: Not on file  . Intimate partner violence:    Fear of current or ex partner: Not on file    Emotionally abused: Not on file    Physically abused: Not on file    Forced sexual activity: Not on file  Other Topics Concern  . Not on file  Social History Narrative  . Not on file   Past Surgical History:  Procedure Laterality Date  . IVP     Family History  Problem Relation Age of Onset  . Lupus Mother   . Stroke Father   . Parkinson's disease Father   . Hypertension Father   . Gout Father   . Cancer Maternal Grandmother    Allergies  Allergen Reactions  . Penicillin V Potassium Other (See Comments)  . Penicillins     Other reaction(s): RASH  . Strawberry Extract     Other reaction(s): RASH Other reaction(s): RASH  . Wheat Bran     Other reaction(s): OTHER Other reaction(s): OTHER  . Other Rash      Assessment & Plan:  Patient presents for a four-month lymphedema/DVT follow-up.  The patient does have a past medical history of factor V Leiden and DVT to the right lower extremity.  Since our last visit, the patient continues to engage in conservative therapy including wearing medical grade 1 compression socks, elevating her legs and remaining active.  The patient has started using a lymphedema pump which she uses once a day towards the evening for an hour.  The patient notes a market improvement in the edema and discomfort to her bilateral legs however is still experiencing a bluish tint to her bilateral toes.  The patient notes that her bilateral feet are "always cold".  The patient continues to take Eliquis and have an IVC filter in place indefinitely.  Patient denies any shortness of breath or chest  pain.  Denies any ulcer formation to the bilateral lower extremity.  Patient denies any fever, nausea vomiting.  1. 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 She understands that if any acute/no DVT is found there would be no change in the plan she will remain on Eliquis and the IVC filter will stay in place Patient to continue engaging conservative therapy and using her lymphedema pump at least once or twice a day for an hour each time  - VAS [place] LOWER EXTREMITY VENOUS (DVT); Future  2. Factor V Leiden ([place]) - Stable The patient is on Eliquis and has an IVC filter in place  3. Lymphedema - Stable The patient is currently engaging conservative therapy including wearing medical grade 1 compression socks, elevating her legs and remaining active The patient is using her lymphedema pump at least once a day for an hour each time.  We had a long conversation about increasing that to twice a day for an hour each time if she notes her edema is worsening or to improve the bluish tint to her feet We have never had the patient undergo bilateral ABI to rule out any contributing peripheral artery disease. However the patient back and have her undergo a baseline ABI to rule out any contributing peripheral artery disease  - VAS [place] ABI WITH/WO TBI; Future   Current Outpatient Medications on File Prior to Visit    Medication Sig Dispense Refill  . ELIQUIS 5 MG TABS tablet TAKE 1 TABLET BY MOUTH TWICE PER DAY 60 tablet 5  . cyclobenzaprine (FLEXERIL) 10 MG tablet Take 1 tablet (10 mg total) 3 (three) times daily as needed by mouth for muscle spasms. (Patient not taking: Reported on 02/10/2018) 30 tablet 0  . gabapentin (NEURONTIN) 100 MG capsule 1 po qHS    . phentermine 37.5 MG capsule TAKE ONE CAPSULE BY MOUTH EVERY MORNING BEFORE BREAKFAST     No current facility-administered medications on file prior to visit.     There are no Patient Instructions on file for this visit. No follow-ups on file.   Medora Roorda A Delyle Weider, PA-C

## 2018-09-17 ENCOUNTER — Encounter (INDEPENDENT_AMBULATORY_CARE_PROVIDER_SITE_OTHER): Payer: Self-pay | Admitting: Vascular Surgery

## 2018-09-17 ENCOUNTER — Ambulatory Visit (INDEPENDENT_AMBULATORY_CARE_PROVIDER_SITE_OTHER): Payer: Medicaid Other

## 2018-09-17 ENCOUNTER — Ambulatory Visit (INDEPENDENT_AMBULATORY_CARE_PROVIDER_SITE_OTHER): Payer: Medicaid Other | Admitting: Vascular Surgery

## 2018-09-17 VITALS — BP 124/87 | HR 82 | Resp 16 | Ht <= 58 in | Wt 220.0 lb

## 2018-09-17 DIAGNOSIS — I82521 Chronic embolism and thrombosis of right iliac vein: Secondary | ICD-10-CM

## 2018-09-17 DIAGNOSIS — Z7901 Long term (current) use of anticoagulants: Secondary | ICD-10-CM | POA: Diagnosis not present

## 2018-09-17 DIAGNOSIS — R29898 Other symptoms and signs involving the musculoskeletal system: Secondary | ICD-10-CM | POA: Diagnosis not present

## 2018-09-17 DIAGNOSIS — I890 Lymphedema, not elsewhere classified: Secondary | ICD-10-CM

## 2018-09-17 NOTE — Progress Notes (Signed)
Subjective:    Patient ID: Caroline Madden, female    DOB: 1980/09/01, 38 y.o.   MRN: [id number] Chief Complaint  Patient presents with  . Follow-up    ultrasound follow up   Patient presents to review vascular studies.  Patient was seen 09/01/18 for evaluation of bilateral lower extremity weakness.  The patient continues to take Eliquis 5 mg 1 tab twice daily.  The patient does have an IVC filter indefinitely.  The patient underwent an ABI which was notable for: Right: 1.17, triphasic tibials with normal great toe forms Left: 1.05, triphasic tibials with normal great toe forms The patient underwent a left lower extremity venous reflux study which was notable for: Normal reflux times were noted in the popliteal vein.  Findings consistent with chronic deep vein thrombosis involving the right femoral vein.  No evidence of superficial venous thrombosis.  She denies any fever, nausea vomiting.  Review of Systems  Constitutional: Negative.   HENT: Negative.   Eyes: Negative.   Respiratory: Negative.   Cardiovascular: Negative.   Gastrointestinal: Negative.   Endocrine: Negative.   Genitourinary: Negative.   Musculoskeletal: Negative.   Skin: Negative.   Allergic/Immunologic: Negative.   Neurological: Negative.   Hematological: Negative.   Psychiatric/Behavioral: Negative.       Objective:   Physical Exam Vitals signs reviewed.  Constitutional:      Appearance: Normal appearance. She is obese.  HENT:     Head: Normocephalic and atraumatic.     Right Ear: External ear normal.     Left Ear: External ear normal.     Nose: Nose normal.     Mouth/Throat:     Mouth: Mucous membranes are moist.     Pharynx: Oropharynx is clear.  Eyes:     Extraocular Movements: Extraocular movements intact.     Conjunctiva/sclera: Conjunctivae normal.     Pupils: Pupils are equal, round, and reactive to light.  Neck:     Musculoskeletal: Normal range of motion.  Cardiovascular:     Rate and  Rhythm: Normal rate and regular rhythm.     Comments: To palpate pedal pulses due to body habitus and edema however the bilateral feet are warm Pulmonary:     Effort: Pulmonary effort is normal.     Breath sounds: Normal breath sounds.  Musculoskeletal: Normal range of motion.        General: Swelling (Mild bilateral extremity edema noted) present.  Skin:    General: Skin is warm and dry.  Neurological:     General: No focal deficit present.     Mental Status: She is alert and oriented to person, place, and time. Mental status is at baseline.  Psychiatric:        Mood and Affect: Mood normal.        Behavior: Behavior normal.        Thought Content: Thought content normal.        Judgment: Judgment normal.    BP 124/87 (BP Location: Right Arm)   Pulse 82   Resp 16   Ht 4\' 8"  (1.422 m)   Wt 220 lb (99.8 kg)   BMI 49.32 kg/m   Past Medical History:  Diagnosis Date  . Cancer ([place])    skin ca  . DVT (deep venous thrombosis) ([place]) 2002  . Renal calculi    Social History   Socioeconomic History  . Marital status: Single    Spouse name: Not on file  . Number of children: Not  on file  . Years of education: Not on file  . Highest education level: Not on file  Occupational History  . Not on file  Social Needs  . Financial resource strain: Not on file  . Food insecurity:    Worry: Not on file    Inability: Not on file  . Transportation needs:    Medical: Not on file    Non-medical: Not on file  Tobacco Use  . Smoking status: Never Smoker  . Smokeless tobacco: Never Used  Substance and Sexual Activity  . Alcohol use: No  . Drug use: No  . Sexual activity: Yes  Lifestyle  . Physical activity:    Days per week: Not on file    Minutes per session: Not on file  . Stress: Not on file  Relationships  . Social connections:    Talks on phone: Not on file    Gets together: Not on file    Attends religious service: Not on file    Active member of club or organization:  Not on file    Attends meetings of clubs or organizations: Not on file    Relationship status: Not on file  . Intimate partner violence:    Fear of current or ex partner: Not on file    Emotionally abused: Not on file    Physically abused: Not on file    Forced sexual activity: Not on file  Other Topics Concern  . Not on file  Social History Narrative  . Not on file   Past Surgical History:  Procedure Laterality Date  . IVP     Family History  Problem Relation Age of Onset  . Lupus Mother   . Stroke Father   . Parkinson's disease Father   . Hypertension Father   . Gout Father   . Cancer Maternal Grandmother    Allergies  Allergen Reactions  . Penicillin V Potassium Other (See Comments)  . Penicillins     Other reaction(s): RASH  . Strawberry Extract     Other reaction(s): RASH Other reaction(s): RASH  . Wheat Bran     Other reaction(s): OTHER Other reaction(s): OTHER  . Other Rash      Assessment & Plan:  Patient presents to review vascular studies.  Patient was seen 09/01/18 for evaluation of bilateral lower extremity weakness.  The patient continues to take Eliquis 5 mg 1 tab twice daily.  The patient does have an IVC filter indefinitely.  The patient underwent an ABI which was notable for: Right: 1.17, triphasic tibials with normal great toe forms Left: 1.05, triphasic tibials with normal great toe forms The patient underwent a left lower extremity venous reflux study which was notable for: Normal reflux times were noted in the popliteal vein.  Findings consistent with chronic deep vein thrombosis involving the right femoral vein.  No evidence of superficial venous thrombosis.  She denies any fever, nausea vomiting.  1. 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 Patient has an IVC filter indefinitely Patient is to continue taking Eliquis 5 mg 1 tab twice daily Patient is to continue engaging conservative therapy including  wearing medical grade 1 compression socks, elevating her legs and remaining active  2. Weakness of both lower extremities - Stable The patient underwent a bilateral ABI to rule out any contributing peripheral artery disease ABI was notable for triphasic tibials and normal great toe waveforms I do not feel that the weakness the  patient experiences is being exacerbated by any peripheral artery disease  F/U PRN  Current Outpatient Medications on File Prior to Visit  Medication Sig Dispense Refill  . ELIQUIS 5 MG TABS tablet TAKE 1 TABLET BY MOUTH TWICE PER DAY 60 tablet 5  . gabapentin (NEURONTIN) 100 MG capsule 1 po qHS    . phentermine 37.5 MG capsule TAKE ONE CAPSULE BY MOUTH EVERY MORNING BEFORE BREAKFAST    . cyclobenzaprine (FLEXERIL) 10 MG tablet Take 1 tablet (10 mg total) 3 (three) times daily as needed by mouth for muscle spasms. (Patient not taking: Reported on 02/10/2018) 30 tablet 0   No current facility-administered medications on file prior to visit.    There are no Patient Instructions on file for this visit. No follow-ups on file.  Lore Polka A Loistine Eberlin, PA-C

## 2018-10-22 DIAGNOSIS — I82521 Chronic embolism and thrombosis of right iliac vein: Secondary | ICD-10-CM

## 2018-10-22 DIAGNOSIS — R102 Pelvic and perineal pain: Secondary | ICD-10-CM

## 2019-01-05 ENCOUNTER — Ambulatory Visit (INDEPENDENT_AMBULATORY_CARE_PROVIDER_SITE_OTHER): Payer: Medicaid Other | Admitting: Obstetrics & Gynecology

## 2019-01-05 ENCOUNTER — Other Ambulatory Visit: Payer: Self-pay

## 2019-01-05 ENCOUNTER — Encounter: Payer: Self-pay | Admitting: Obstetrics & Gynecology

## 2019-01-05 VITALS — BP 120/80 | Ht <= 58 in | Wt 223.0 lb

## 2019-01-05 DIAGNOSIS — D219 Benign neoplasm of connective and other soft tissue, unspecified: Secondary | ICD-10-CM | POA: Diagnosis not present

## 2019-01-05 DIAGNOSIS — N83202 Unspecified ovarian cyst, left side: Secondary | ICD-10-CM

## 2019-01-05 DIAGNOSIS — R102 Pelvic and perineal pain: Secondary | ICD-10-CM

## 2019-01-05 NOTE — Progress Notes (Signed)
   Gynecology Pelvic Pain Evaluation   Chief Complaint:  Chief Complaint  Patient presents with  . Vaginal Bleeding    History of Present Illness:   Patient is a 38 y.o. G1P1001 who LMP was Patient's last menstrual period was 12/31/2018., presents today for a problem visit.  She complains of pain.   Her pain is localized to the RLQ area, described as intermittent, began several months ago and its severity is described as severe. The pain radiates to the  right back. She has these associated symptoms which include heavy bleeding at times; also pressure and bladder frequency when she lays down. Patient has these modifiers which include nothing that make it better and unable to associate with any factor that make it worse.  Context includes: spontaneous.  Prior US showed LEFT ovarian cyst and <1 cm fibroid in Dec and Jan of this year.  Prior ablation.  Vasec for contraception.  Prior DVT, on meds.      PMHx: She  has a past medical history of Cancer ([place]), DVT (deep venous thrombosis) ([place]) (2002), and Renal calculi. Also,  has a past surgical history that includes IVP., family history includes Cancer in her maternal grandmother; Gout in her father; Hypertension in her father; Lupus in her mother; Parkinson's disease in her father; Stroke in her father.,  reports that she has never smoked. She has never used smokeless tobacco. She reports that she does not drink alcohol or use drugs.  She has a current medication list which includes the following prescription(s): eliquis, cyclobenzaprine, gabapentin, and phentermine. Also, is allergic to penicillin v potassium; penicillins; strawberry extract; wheat bran; and other.  Review of Systems  All other systems reviewed and are negative.   Objective: BP 120/80   Ht 4\' 3"  (1.295 m)   Wt 223 lb (101.2 kg)   LMP 12/31/2018   BMI 60.28 kg/m  Physical Exam Constitutional:      General: She is not in acute distress.    Appearance: She is  well-developed.  Musculoskeletal: Normal range of motion.  Neurological:     Mental Status: She is alert and oriented to person, place, and time.  Skin:    General: Skin is warm and dry.  Vitals signs reviewed.     Assessment: 38 y.o. G1P1001 with Pain, abnormal bleeding, bladder pressure/frequency.  1. Fibroid - US PELVIC COMPLETE WITH TRANSVAGINAL; Future  2. Left ovarian cyst - US PELVIC COMPLETE WITH TRANSVAGINAL; Future  3. Pelvic pain - US PELVIC COMPLETE WITH TRANSVAGINAL; Future  Options for surgery, likely hysterectomy, as she has had ablation and cannot take hormonal therapy due to DVT risks.  Would need blood thinner mgt.  Obesity also a risk factor for surgery.    Pros and cons of surgery discussed; may relieve pressure on bladder and even right lower quadrant, also stop bleeding; also may not improve pain or pressure if these are more neurologic in origins.  Cyst has been left sided despite pain right sided, so not a great correlation there.  Would keep ovaries.  A total of 15 minutes were spent face-to-face with the patient during this encounter and over half of that time dealt with counseling and coordination of care.  Barnett Applebaum, MD, [facility], [facility] 01/05/2019  3:03 PM

## 2019-01-07 ENCOUNTER — Other Ambulatory Visit: Payer: Self-pay

## 2019-01-07 ENCOUNTER — Ambulatory Visit (INDEPENDENT_AMBULATORY_CARE_PROVIDER_SITE_OTHER): Payer: Medicaid Other

## 2019-01-07 DIAGNOSIS — R102 Pelvic and perineal pain: Secondary | ICD-10-CM

## 2019-01-07 DIAGNOSIS — D219 Benign neoplasm of connective and other soft tissue, unspecified: Secondary | ICD-10-CM | POA: Diagnosis not present

## 2019-01-07 DIAGNOSIS — N83202 Unspecified ovarian cyst, left side: Secondary | ICD-10-CM | POA: Diagnosis not present

## 2019-01-10 ENCOUNTER — Other Ambulatory Visit (INDEPENDENT_AMBULATORY_CARE_PROVIDER_SITE_OTHER): Payer: Self-pay | Admitting: Vascular Surgery

## 2019-02-04 ENCOUNTER — Ambulatory Visit: Payer: Medicaid Other | Admitting: Obstetrics & Gynecology

## 2019-02-04 ENCOUNTER — Other Ambulatory Visit: Payer: Medicaid Other

## 2019-02-12 ENCOUNTER — Other Ambulatory Visit: Payer: Self-pay

## 2019-02-12 ENCOUNTER — Encounter (INDEPENDENT_AMBULATORY_CARE_PROVIDER_SITE_OTHER): Payer: Self-pay

## 2019-02-12 ENCOUNTER — Ambulatory Visit (INDEPENDENT_AMBULATORY_CARE_PROVIDER_SITE_OTHER): Payer: Medicaid Other | Admitting: Vascular Surgery

## 2019-02-12 ENCOUNTER — Ambulatory Visit (INDEPENDENT_AMBULATORY_CARE_PROVIDER_SITE_OTHER): Payer: Medicaid Other

## 2019-02-12 DIAGNOSIS — I87009 Postthrombotic syndrome without complications of unspecified extremity: Secondary | ICD-10-CM | POA: Diagnosis not present

## 2019-02-13 ENCOUNTER — Other Ambulatory Visit (INDEPENDENT_AMBULATORY_CARE_PROVIDER_SITE_OTHER): Payer: Self-pay | Admitting: Vascular Surgery

## 2019-02-13 DIAGNOSIS — I82529 Chronic embolism and thrombosis of unspecified iliac vein: Secondary | ICD-10-CM

## 2019-02-13 DIAGNOSIS — D6851 Activated protein C resistance: Secondary | ICD-10-CM

## 2019-02-13 DIAGNOSIS — Z95828 Presence of other vascular implants and grafts: Secondary | ICD-10-CM

## 2019-02-16 ENCOUNTER — Encounter (INDEPENDENT_AMBULATORY_CARE_PROVIDER_SITE_OTHER): Payer: Self-pay | Admitting: Vascular Surgery

## 2019-05-04 NOTE — Progress Notes (Signed)
 Chief Complaint  Patient presents with  . Fainting    3rd time since 2 weeks. pt stated she has a knot on her right side of her head.   . Tick bite    2 weeks ago    HPI  Caroline Madden is a 38 y.o. here for an acute issue.  She has a PMH of clotting disorder/factor V Leiden/on Eliquis, thrombophlebitis/lymphedema who presents with 3 fainting spells, the most severe occurring last evening.  After dinner she got up to the bathroom, though she does not remember this and was sitting on the toilet fully dressed.  Her daughter noticed her there and she had been incontinent of urine as well as being wet with sweat.  No chest pain, palpitations or seizure-like activity noted.  No prior history of seizures though her daughter has seizure disorder.  The other 2 fainting episodes occurred while she was in her recliner using her lymphedema machine.  She thinks she sweated during these episodes as well.  No chest pain or palpitations.  She was aroused by her daughter, wondering if she had fallen asleep but does not think she was sleeping.  She does report very poor sleeping habits.  I just do not sleep well at all.  Last evening she noted a knot on her head right posterior back.  Does not remember hitting her head.  She denies any headaches, bowel changes.  No constipation or straining.  She reports removing a tick 2 weeks ago.  Has not felt fevers but has had these sweating episodes.  No myalgias.    ROS  Pertinent items are noted in HPI.  Outpatient Encounter Medications as of 05/04/2019  Medication Sig Dispense Refill  . apixaban (ELIQUIS) 5 mg tablet TAKE 1 TABLET BY MOUTH TWICE PER DAY    . gabapentin  (NEURONTIN ) 100 MG capsule 1 po qHS 30 capsule 0  . [DISCONTINUED] ofloxacin (OCUFLOX) 0.3 % ophthalmic solution Place 5 drops into the left eye 2 (two) times daily (Patient not taking: Reported on 05/04/2019  ) 10 mL 0   No facility-administered encounter medications on file as of 05/04/2019.      Allergies as of 05/04/2019 - Reviewed 05/04/2019  Allergen Reaction Noted  . Other Rash 12/28/2015  . Penicillin v potassium Unknown 03/08/2014    Past Medical History:  Diagnosis Date  . Clotting disorder (CMS-HCC)   . Phlebitis    with BCPs prior to pregnancy and with the pregnancy    Past Surgical History:  Procedure Laterality Date  . blood filter behind knee Right   . EXCISION GANGLION CYST WRIST PRIMARY Left   . Uterine ablation      There were no vitals filed for this visit.  Physical Exam  General. Well appearing; NAD; VS reviewed     HEENT: Sclera and conjunctiva clear; EOMI, scalp has a 1 cm mobile not on the right posterior scalp without erythema no tenderness.  Consistent with a cyst. Neck. Supple. No thyromegaly, lymphadenopathy. Lungs. Respirations unlabored; clear to auscultation bilaterally. Cardiovascular. Heart regular rate and rhythm without murmurs, gallops, or rubs. Extremities:  No edema. Skin. Normal color and turgor Neurologic. Alert and oriented x3.  Normal gait.  Assessment and Plan 38 year old who reports 3 episodes of fainting, most severe last evening.  Spells have been associated with diaphoresis and 1 episode of urinary incontinence.  No chest pain or palpitations.  Normal EKG today.  She reports very poor sleeping habits.  History suggestive of falling asleep  rather than true syncopal episodes.  However she has had diaphoresis.  Check labs today.  Consider CT of the head.  Plan on close follow-up.   1. Syncope, unspecified syncope type  - ECG 12-lead - CBC w/auto Differential (5 Part) - Comprehensive Metabolic Panel (CMP) - Thyroid Stimulating-Hormone (TSH) - Urinalysis w/Microscopic  2. Diaphoresis  - CBC w/auto Differential (5 Part) - Comprehensive Metabolic Panel (CMP) - Thyroid Stimulating-Hormone (TSH) - Urinalysis w/Microscopic  3. Urinary frequency  - Urinalysis w/Microscopic  4. Scalp cyst Likely benign.  Continue  to monitor.  5.  History of factor V Leiden/phlebitis.   On Eliquis.  I have personally performed this service.  [id number] [place], [place]

## 2019-05-18 ENCOUNTER — Ambulatory Visit: Payer: Medicaid Other

## 2019-06-05 IMAGING — US US PELVIS COMPLETE TRANSABD/TRANSVAG
2 series · 13 of 25 positions shown · non-contrast
Comparison: Lumbar MRI 06/03/2018

CLINICAL DATA: Adnexal mass seen on MRI

EXAM:
TRANSABDOMINAL AND TRANSVAGINAL ULTRASOUND OF PELVIS
TECHNIQUE: Both transabdominal and transvaginal ultrasound examinations of the
pelvis were performed. Transabdominal technique was performed for
global imaging of the pelvis including uterus, ovaries, adnexal
regions, and pelvic cul-de-sac. It was necessary to proceed with
endovaginal exam following the transabdominal exam to visualize the
uterus endometrium and ovaries.

[Series 1: us pelvis complete transabd/transvag · 0.22mm/px · 103 acquisitions, 12 frames shown (1 of 2)]
[im 1/103]
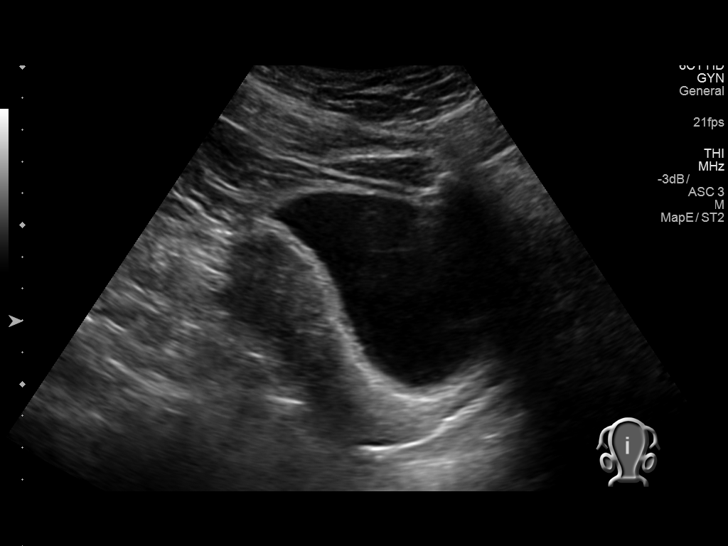
[im 9/103]
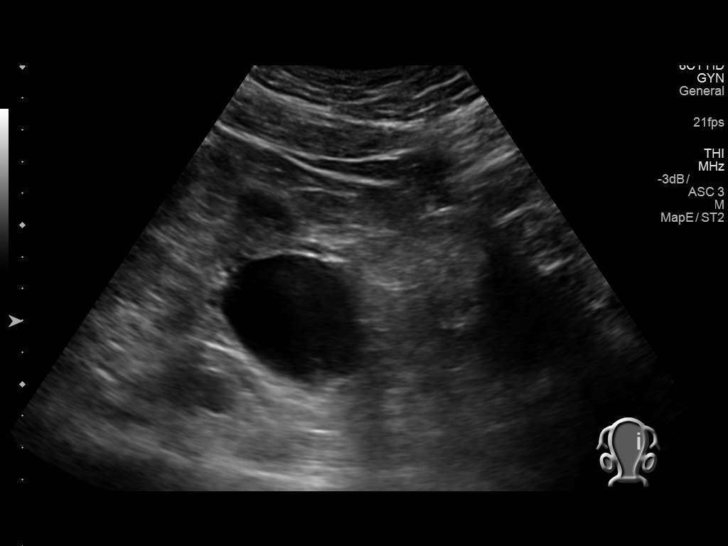
[im 18/103]
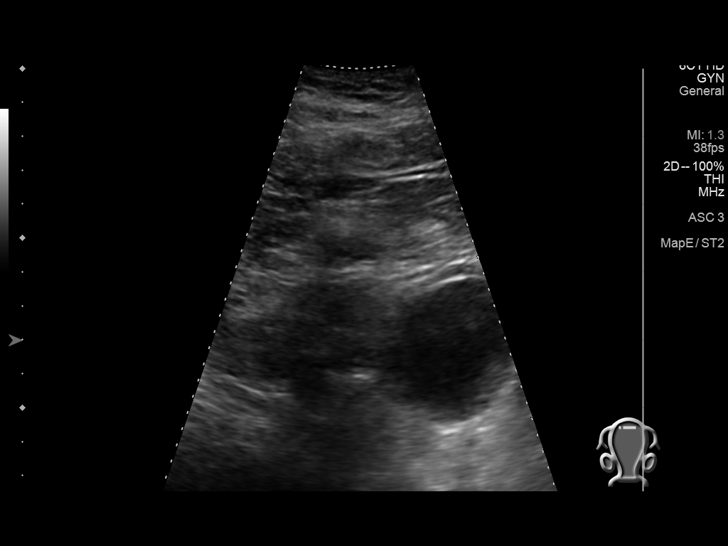
[im 27/103]
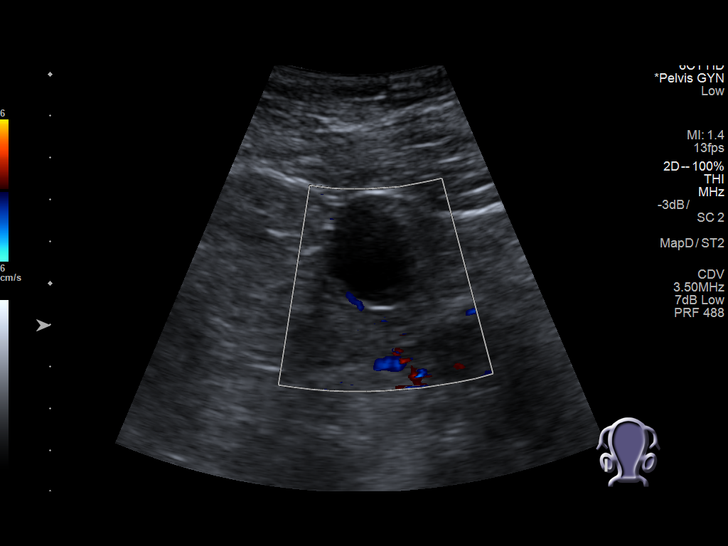
[im 36/103]
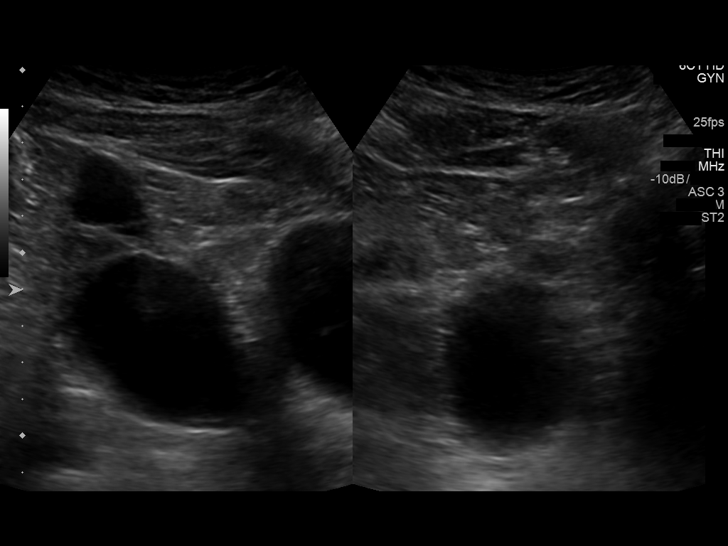
[im 45/103]
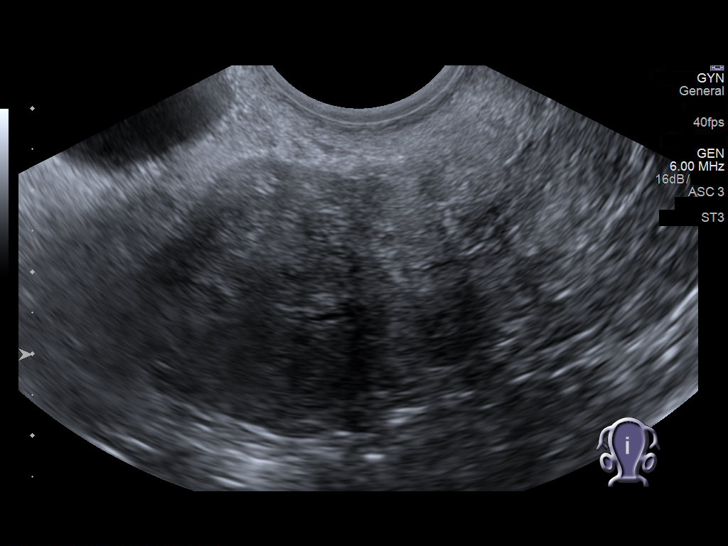
[im 54/103]
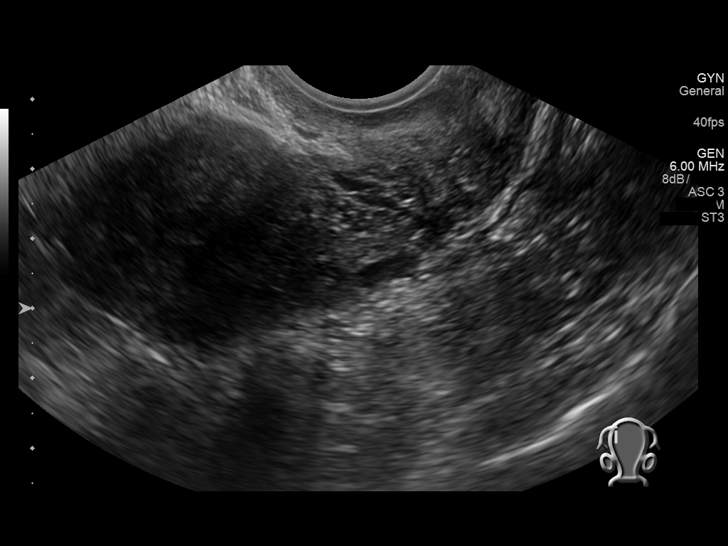
[im 63/103]
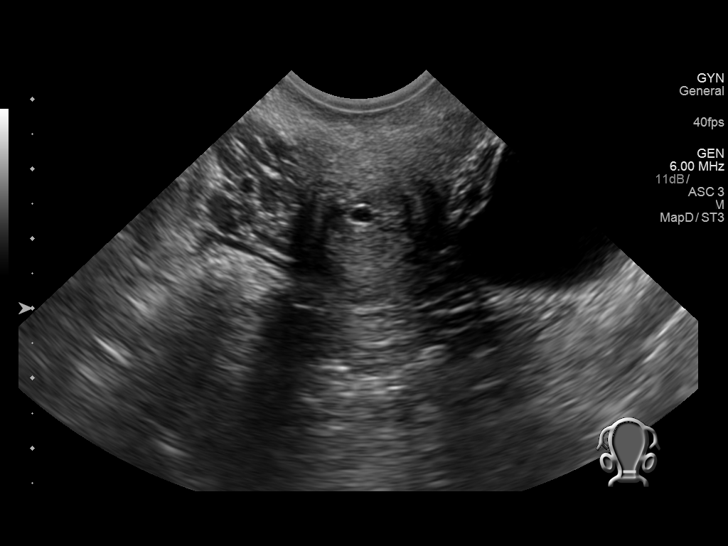
[im 71/103]
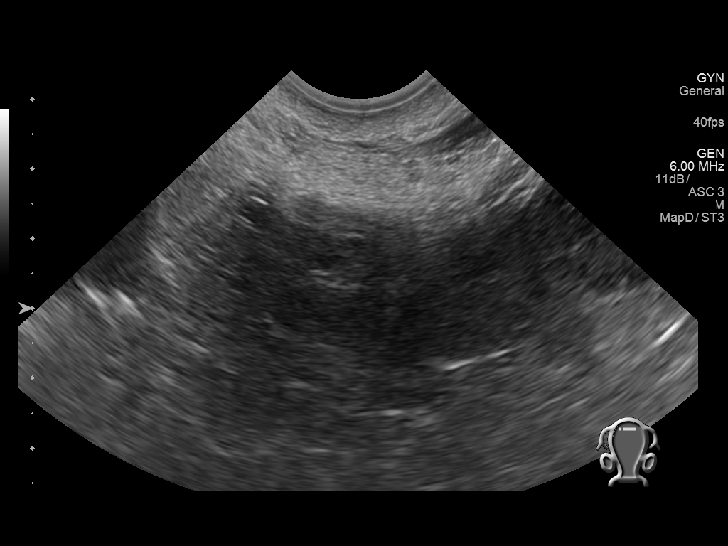
[im 80/103]
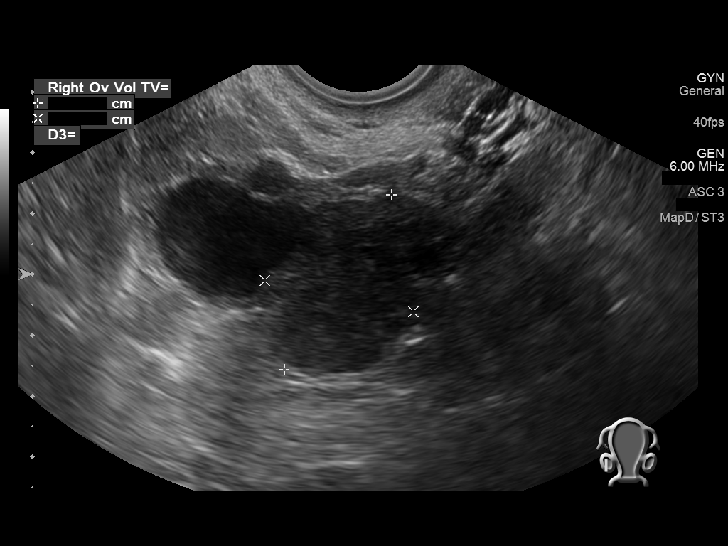
[im 89/103]
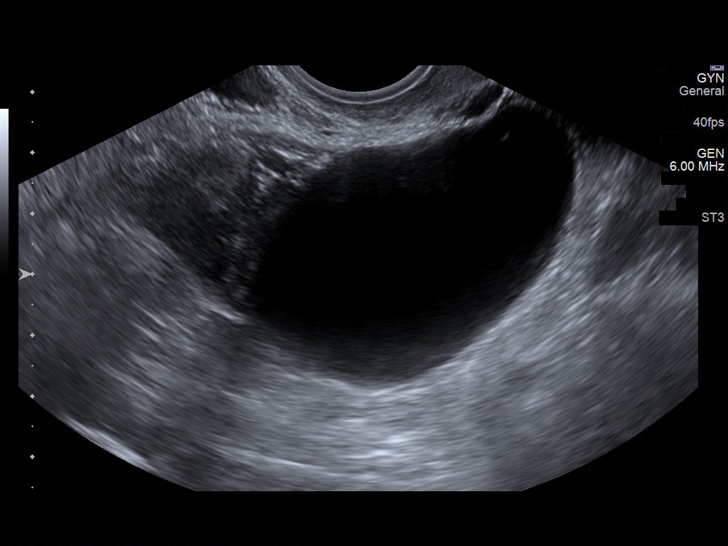
[im 98/103]
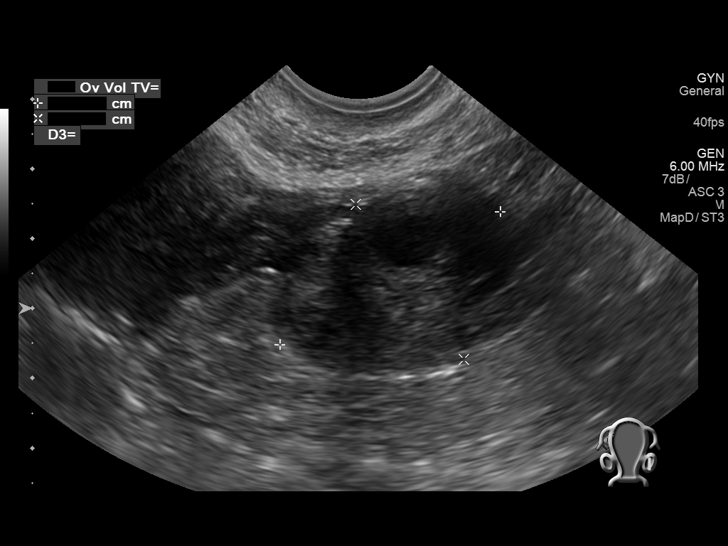

[Series 1002: us pelvis complete transabd/transvag · 0.12mm/px · 1 of 5 slices shown (2 of 2)]
[im 1/5]
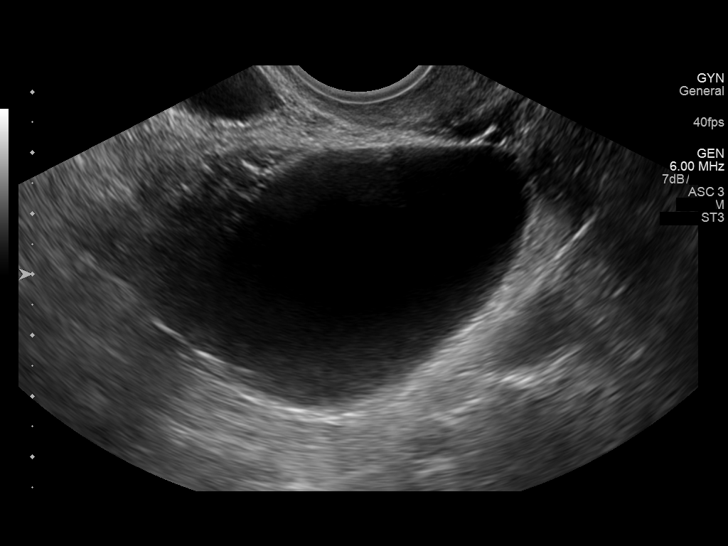

[13 of 25 positions shown; findings below may reference images not displayed]

FINDINGS: Uterus

Measurements: 7.1 x 3.8 x 4.3 cm = volume: 59.5 mL. Right anterior
intramural mass measuring 2.3 x 1.3 x 1.3 cm.

Endometrium

Thickness: 6.2 mm.  No focal abnormality visualized.

Right ovary

Measurements: 3.4 x 2.5 x 2.5 cm = volume: 11.9 mL. Cyst in the
right ovary measuring 2.9 x 2.2 x 2.3 cm

Left ovary

Measurements: 3.7 x 2.7 x 2.8 cm = volume: 14.4 mL. Large exophytic
cyst measuring 5.9 x 3.9 x 4.5 cm, corresponding to MRI abnormality.

Other findings

No abnormal free fluid.
IMPRESSION: 1. 5.9 cm left adnexal cyst corresponding to the MRI abnormality.
This has benign characteristics. No imaging follow up is required
for premenopausal females. This follows consensus guidelines: Simple
Adnexal Cysts: SRU Consensus Conference Update on Follow-up and
2. Uterine fibroid

## 2019-06-25 NOTE — Progress Notes (Signed)
 Chief Complaint  Patient presents with  . Wrist Pain    Left ganglion cyst    History of the Present Illness: Caroline Madden is a 38 y.o. female here for evaluation of right wrist ganglion cyst. The patient has been having pain for approximately 5 weeks. The pain is produced with motion and use of the right wrist. The patient states she has pain with lifting, washing dishes or cleaning. She has no numbness or tingling. The patient has not had radiographs of her right wrist.  The patient is right handed.   She has a remote history of a ganglion cyst removal of the left hand in 2007.  I have reviewed past medical, surgical, social and family history, and allergies as documented in the EMR.   Past Medical History: Past Medical History:  Diagnosis Date  . Clotting disorder (CMS-HCC)   . Phlebitis    with BCPs prior to pregnancy and with the pregnancy    Past Surgical History: Past Surgical History:  Procedure Laterality Date  . blood filter behind knee Right   . EXCISION GANGLION CYST WRIST PRIMARY Left   . Uterine ablation      Past Family History: Family History  Problem Relation Age of Onset  . Lupus Mother   . High blood pressure (Hypertension) Father   . Gout Father   . Parkinsonism Father     Medications: Current Outpatient Medications Ordered in Epic  Medication Sig Dispense Refill  . apixaban (ELIQUIS) 5 mg tablet TAKE 1 TABLET BY MOUTH TWICE PER DAY    . gabapentin  (NEURONTIN ) 100 MG capsule 1 po qHS 30 capsule 0   No current Epic-ordered facility-administered medications on file.     Allergies: Allergies  Allergen Reactions  . Other Rash  . Penicillin V Potassium Unknown     Body mass index is 53.67 kg/m.   Review of Systems: A comprehensive 14 point ROS was performed, reviewed, and the pertinent orthopaedic findings are documented in the HPI.   Vitals:   06/25/19 1044  BP: (!) 146/90    General Physical Examination:   General/Constitutional: No apparent distress: well-nourished and well developed. Eyes: Pupils equal, round with synchronous movement. Lungs: Clear to auscultation HEENT: Normal Vascular: No edema, swelling or tenderness, except as noted in detailed exam. Cardiac:  Heart rate and rhythm is regular. Integumentary: No impressive skin lesions present, except as noted in detailed exam. Neuro/Psych: Normal mood and affect, oriented to person, place and time.   Musculoskeletal Examination: On exam, she has a 1 cm ganglion cysts just distal to the radiocarpal joint. Lungs are clear.  Heart rate and rhythm is normal.  HEENT is normal.   Radiographs: AP, lateral, and oblique x-rays of the right wrist were ordered and personally reviewed today. These show no significant degenerative changes to the wrist. There is some serosal scarring at the base of the 2nd metacarpal in the area of the lesion. Carpus appears normal.   X-ray Impression Normal right wrist, except for some degenerative change to the base of the 2nd and 3rd CMC joints.    Assessment:   ICD-10-CM  1. Ganglion of right wrist  M67.431     Plan: The patient has clinical findings of right dorsal wrist ganglion cyst of the [facility] joint that is very symptomatic.   We discussed the patient's x-rays and treatment options. I explained cyst removal in detail, as well as the postoperative course. I explained I would like her to wear a wrist  brace for 1 month after surgery. I also explained she would be tested for COVID prior to surgery. We will schedule the patient for ganglion cyst removal in the hospital in the near future.   Surgical Risks:  The nature of the condition and the proposed procedure has been reviewed in detail with the patient.  Surgical versus non-surgical options and prognosis for recovery have been reviewed and the inherent risks and benefits of each have been discussed including the risks of infection, bleeding, injury  to nerves / blood vessels / tendons, incomplete relief of symptoms, persisting pain and / or stiffness, loss of function, complex regional pain syndrome, failure of procedure, as appropriate.  Teeth:  Natural   Scribe Attestation: I, Dawn Royse, am acting as scribe for [employer], MD

## 2019-06-25 NOTE — Progress Notes (Signed)
 Review of Systems   Constitutional:  Positive for activity change.   HENT: Negative.     Eyes: Negative.    Respiratory: Negative.     Cardiovascular: Negative.    Gastrointestinal: Negative.    Endocrine: Negative.    Genitourinary: Negative.    Musculoskeletal: Negative.    Skin: Negative.    Allergic/Immunologic: Negative.    Neurological: Negative.    Hematological: Negative.    Psychiatric/Behavioral: Negative.

## 2019-08-11 NOTE — Progress Notes (Signed)
 Goals     . Follow my doctor's care plan     Follow my doctors treatment plan and follow-up as scheduled. Take all medications as prescribed and report any changes as necessary.     . Maintain health/healthy lifestyle     Patient states they would like to maintain a healthy lifestyle by maintaining a healthy diet and exercise routine       \

## 2019-08-11 NOTE — Progress Notes (Signed)
 Chief Complaint  Patient presents with  . Follow-up  . Fall    pt fell 3 weeks ago  . Head Injury    pt fell causing a knot    HPI  Caroline Madden is a 39 y.o. here for a Follow up. Hx of factor V Leyden deficiency and has been on Eliquis States she fell 3 weeks back  C/o A Growth on Rt side of scalp  Also has a cyst on Rt Wrist  States her legs have gotten progressively weaker and has resulted in increased frequency of  falls. Pain is localized to Proximal Rt foot and lower part of Tibia and Fibula States her balance is poor and has fallen a  few times  Has gained some weight and is no longer on Phentermine Denies  Chest pains or  shortness of breath      Outpatient Encounter Medications as of 08/11/2019  Medication Sig Dispense Refill  . apixaban (ELIQUIS) 5 mg tablet TAKE 1 TABLET BY MOUTH TWICE PER DAY    . gabapentin  (NEURONTIN ) 100 MG capsule 1 po qHS 30 capsule 0   No facility-administered encounter medications on file as of 08/11/2019.     Allergies as of 08/11/2019 - Reviewed 08/11/2019  Allergen Reaction Noted  . Other Rash 12/28/2015  . Penicillin v potassium Unknown 03/08/2014    Past Medical History:  Diagnosis Date  . Clotting disorder (CMS-HCC)   . Phlebitis    with BCPs prior to pregnancy and with the pregnancy    Past Surgical History:  Procedure Laterality Date  . blood filter behind knee Right   . EXCISION GANGLION CYST WRIST PRIMARY Left   . Uterine ablation      Vitals:   08/11/19 0941  BP: 132/86  Pulse: 106   Wt Readings from Last 3 Encounters:  08/11/19 96.8 kg (213 lb 6.4 oz)  06/25/19 (!) 101 kg (222 lb 9.6 oz)  05/04/19 100.7 kg (222 lb)  Body mass index is 51.45 kg/m. Exam Obese General. NAD; VS reviewed     Eyes. Sclera and conjunctiva clear; Vision grossly intact; extraocular movements intact Oropharynx. No suspicious lesions Neck. Supple. No swelling, masses, thyroid normal size, no masses palpated.   Lungs.  Respirations unlabored; clear to auscultation bilaterally Cardiovascular. Heart regular rate and rhythm without murmurs, gallops, or rubs Abdomen: Non tender. No masses felt Skin. Normal color and turgor Cysic lesion noted rt side of scalp  Rt lower extremity edema noted  Neurologic. 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 Colonoscopy- 2010- negative. Check Cbc, met-C, Lipids, U,a and TSH today Continue efforts at weight reduction Discussed fall prevention Follow up in 4 months.      Tamra Leventhal  MD

## 2019-08-17 NOTE — Progress Notes (Signed)
 PATIENT PROFILE: Caroline Madden is a 39 y.o. female who presents to the Clinic for consultation at the request of Dr. Sadie for evaluation of epidermal inclusion cyst.  PCP:  Sadie Tamra Cal, MD  HISTORY OF PRESENT ILLNESS: [name] reports having a cyst in her posterior scalp since 32-month ago.  She reported that it is increasing in size.  Since it has been increasing in size it started to get painful.  The pain is located on the cyst area on the posterior scalp.  There is no pain radiation.  There is no alleviating factor.  Aggravating factor is applying pressure to the cyst.  There has been no drainage.   PROBLEM LIST: Problem List  Date Reviewed: 08/11/2019         Noted   Factor V Leiden (CMS-HCC) 12/28/2015   Cramps of right lower extremity 12/28/2015   BMI 50.0-59.9, adult (CMS-HCC) 12/28/2015   Dizziness 09/26/2015   History of DVT (deep vein thrombosis) 06/27/2015   Poor balance 06/27/2015       GENERAL REVIEW OF SYSTEMS:   General ROS: negative for - chills, fatigue, fever, weight gain or weight loss Allergy and Immunology ROS: negative for - hives  Hematological and Lymphatic ROS: Positive for - bleeding problems or bruising, negative for palpable nodes Endocrine ROS: Positive for - heat or cold intolerance Respiratory ROS: negative for - cough, shortness of breath or wheezing Cardiovascular ROS: no chest pain or palpitations.  Positive for leg swelling GI ROS: negative for nausea, vomiting, abdominal pain, diarrhea, constipation Musculoskeletal ROS: Positive for - joint swelling or muscle pain Neurological ROS: negative for - confusion, syncope.  Positive for headaches Dermatological ROS: negative for pruritus and rash Psychiatric: negative for anxiety, depression, positive for difficulty sleeping  MEDICATIONS: Current Outpatient Medications  Medication Sig Dispense Refill  . apixaban (ELIQUIS) 5 mg tablet Take 1 tablet (5 mg total) by mouth every 12  (twelve) hours 60 tablet 5  . gabapentin  (NEURONTIN ) 100 MG capsule 1 po qHS 30 capsule 0   No current facility-administered medications for this visit.     ALLERGIES: Other and Penicillin v potassium  PAST MEDICAL HISTORY: Past Medical History:  Diagnosis Date  . Clotting disorder (CMS-HCC)   . Phlebitis    with BCPs prior to pregnancy and with the pregnancy    PAST SURGICAL HISTORY: Past Surgical History:  Procedure Laterality Date  . blood filter behind knee Right   . EXCISION GANGLION CYST WRIST PRIMARY Left   . Uterine ablation       FAMILY HISTORY: Family History  Problem Relation Age of Onset  . Lupus Mother   . High blood pressure (Hypertension) Father   . Gout Father   . Parkinsonism Father      SOCIAL HISTORY: Social History   Socioeconomic History  . Marital status: Single    Spouse name: Not on file  . Number of children: Not on file  . Years of education: Not on file  . Highest education level: Not on file  Occupational History  . Not on file  Social Needs  . Financial resource strain: Not on file  . Food insecurity    Worry: Not on file    Inability: Not on file  . Transportation needs    Medical: Not on file    Non-medical: Not on file  Tobacco Use  . Smoking status: Never Smoker  . Smokeless tobacco: Never Used  Substance and Sexual Activity  . Alcohol use:  No    Alcohol/week: 0.0 standard drinks  . Drug use: Never  . Sexual activity: Defer  Other Topics Concern  . Not on file  Social History Narrative  . Not on file    PHYSICAL EXAM: Vitals:   08/17/19 0940  BP: (!) 142/104  Pulse: 90   Body mass index is 51.36 kg/m. Weight: 96.6 kg (213 lb)   GENERAL: Alert, active, oriented x3  HEENT: Pupils equal reactive to light. Extraocular movements are intact. Sclera clear. Palpebral conjunctiva normal red color.Pharynx clear.  1.5 cm cyst on the posterior scalp, tender to palpation, no erythema.  NECK: Supple with no palpable  mass and no adenopathy.  LUNGS: Sound clear with no rales rhonchi or wheezes.  HEART: Regular rhythm S1 and S2 without murmur.  ABDOMEN: Soft and depressible, nontender with no palpable mass, no hepatomegaly.   EXTREMITIES: Well-developed well-nourished symmetrical with no dependent edema.  NEUROLOGICAL: Awake alert oriented, facial expression symmetrical, moving all extremities.  REVIEW OF DATA: I have reviewed the following data today: Office Visit on 08/11/2019  Component Date Value  . WBC (White Blood Cell Co* 08/11/2019 6.0   . RBC (Red Blood Cell Coun* 08/11/2019 5.23   . Hemoglobin 08/11/2019 16.1*  . Hematocrit 08/11/2019 47.9*  . MCV (Mean Corpuscular Vo* 08/11/2019 91.6   . MCH (Mean Corpuscular He* 08/11/2019 30.8   . MCHC (Mean Corpuscular H* 08/11/2019 33.6   . Platelet Count 08/11/2019 282   . RDW-CV (Red Cell Distrib* 08/11/2019 13.1   . MPV (Mean Platelet Volum* 08/11/2019 9.6   . Neutrophils 08/11/2019 3.10   . Lymphocytes 08/11/2019 2.29   . Monocytes 08/11/2019 0.43   . Eosinophils 08/11/2019 0.09   . Basophils 08/11/2019 0.04   . Neutrophil % 08/11/2019 51.9   . Lymphocyte % 08/11/2019 38.4   . Monocyte % 08/11/2019 7.2   . Eosinophil % 08/11/2019 1.5   . Basophil% 08/11/2019 0.7   . Immature Granulocyte % 08/11/2019 0.3   . Immature Granulocyte Cou* 08/11/2019 0.02   . Glucose 08/11/2019 82   . Sodium 08/11/2019 139   . Potassium 08/11/2019 4.1   . Chloride 08/11/2019 104   . Carbon Dioxide (CO2) 08/11/2019 21.2*  . Urea  Nitrogen (BUN) 08/11/2019 12   . Creatinine 08/11/2019 0.7   . Glomerular Filtration Ra* 08/11/2019 94   . Calcium 08/11/2019 9.8   . AST  08/11/2019 25   . ALT  08/11/2019 18   . Alk Phos (alkaline Phosp* 08/11/2019 78   . Albumin 08/11/2019 4.6   . Bilirubin, Total 08/11/2019 0.5   . Protein, Total 08/11/2019 7.0   . A/G Ratio 08/11/2019 1.9   . Hemoglobin A1C 08/11/2019 5.3   . Average Blood Glucose (C* 08/11/2019 105    . Cholesterol, Total 08/11/2019 222*  . Triglyceride 08/11/2019 139   . HDL (High Density Lipopr* 98/73/7978 38.5   . LDL Calculated 08/11/2019 843*  . VLDL Cholesterol 08/11/2019 28   . Cholesterol/HDL Ratio 08/11/2019 5.8   . Thyroid Stimulating Horm* 08/11/2019 2.545   . Color 08/11/2019 Yellow   . Clarity 08/11/2019 Clear   . Specific Gravity 08/11/2019 >=1.030   . pH, Urine 08/11/2019 5.5   . Protein, Urinalysis 08/11/2019 Negative   . Glucose, Urinalysis 08/11/2019 Negative   . Ketones, Urinalysis 08/11/2019 40 *  . Blood, Urinalysis 08/11/2019 Moderate*  . Nitrite, Urinalysis 08/11/2019 Negative   . Leukocyte Esterase, Urin* 08/11/2019 Negative   . White Blood Cells, Urina* 08/11/2019 0-3   .  Red Blood Cells, Urinaly* 08/11/2019 4-10*  . Bacteria, Urinalysis 08/11/2019 Many*  . Squamous Epithelial Cell* 08/11/2019 Few   Appointment on 06/23/2019  Component Date Value  . Lyme IgG/IgM Ab - [facility] 06/23/2019 <0.91   . Lyme Disease Ab, Quant, * 06/23/2019 <0.80      ASSESSMENT: Ms. Soden is a 39 y.o. female presenting for consultation for epidermal inclusion cyst. The patient has a mass on the posterior scalp that is causing some pain and discomfort on pressure. Patient oriented about the diagnosis of epidermal inclusion cyst, not a malignant lesion but if it is causing symptoms, excision can be considered. Patient oriented about the procedure, benefits and risk.   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 Excision of scalp cyst (11422) 2. Hold Eliquis for 4 days 3. Vascular surgery clearance to hold Eliquis 4. Contact us  if has any question or concern  Patient verbalized understanding, all questions were answered, and were agreeable with the plan outlined above.   Lucas Sjogren, MD  Electronically signed by Lucas Sjogren, MD

## 2019-08-20 HISTORY — PX: CYST EXCISION: SHX5701

## 2020-02-11 ENCOUNTER — Ambulatory Visit (INDEPENDENT_AMBULATORY_CARE_PROVIDER_SITE_OTHER): Payer: Medicaid Other

## 2020-02-11 ENCOUNTER — Ambulatory Visit (INDEPENDENT_AMBULATORY_CARE_PROVIDER_SITE_OTHER): Payer: Medicaid Other | Admitting: Vascular Surgery

## 2020-02-11 ENCOUNTER — Encounter (INDEPENDENT_AMBULATORY_CARE_PROVIDER_SITE_OTHER): Payer: Self-pay | Admitting: Vascular Surgery

## 2020-02-11 ENCOUNTER — Other Ambulatory Visit: Payer: Self-pay

## 2020-02-11 VITALS — BP 118/80 | HR 83 | Resp 16 | Ht <= 58 in | Wt 190.0 lb

## 2020-02-11 DIAGNOSIS — M159 Polyosteoarthritis, unspecified: Secondary | ICD-10-CM

## 2020-02-11 DIAGNOSIS — D6851 Activated protein C resistance: Secondary | ICD-10-CM

## 2020-02-11 DIAGNOSIS — I82521 Chronic embolism and thrombosis of right iliac vein: Secondary | ICD-10-CM

## 2020-02-11 DIAGNOSIS — I82529 Chronic embolism and thrombosis of unspecified iliac vein: Secondary | ICD-10-CM | POA: Diagnosis not present

## 2020-02-11 DIAGNOSIS — M8949 Other hypertrophic osteoarthropathy, multiple sites: Secondary | ICD-10-CM

## 2020-02-11 DIAGNOSIS — Z95828 Presence of other vascular implants and grafts: Secondary | ICD-10-CM

## 2020-02-11 DIAGNOSIS — I87009 Postthrombotic syndrome without complications of unspecified extremity: Secondary | ICD-10-CM | POA: Diagnosis not present

## 2020-02-11 DIAGNOSIS — M150 Primary generalized (osteo)arthritis: Secondary | ICD-10-CM

## 2020-02-11 NOTE — Progress Notes (Signed)
MRN : [id number]  Caroline Madden is a 39 y.o. (1980/11/27) female who presents with chief complaint of No chief complaint on file. Marland Kitchen  History of Present Illness:    The patient presents to the office for evaluation of DVT.  DVT was identified remotely at [facility] by Duplex ultrasound.  The initial symptoms were pain and swelling in the lower extremity.  The patient continues to take Eliquis 5 mg 1 tab twice daily.  The patient does have an IVC filter indefinitely.    The patient notes the left leg in particular continues to be very painful in the back just below the calf muscle.  She denies her legs  Swells.  Symptoms are much better with elevation.  The patient notes minimal edema in the morning which steadily worsens throughout the day.    The patient has not been using compression therapy at this point.  No SOB or pleuritic chest pains.  No cough or hemoptysis.  No blood per rectum or blood in any sputum.  No excessive bruising per the patient.  Previous ABI which was notable for: Right: 1.17, triphasic tibials with normal great toe forms Left: 1.05, triphasic tibials with normal great toe forms  Duplex ultrasound of the IVC and iliac veins shows the venous system is patent  No outpatient medications have been marked as taking for the 02/11/20 encounter (Appointment) with Delana Meyer, Dolores Lory, MD.    Past Medical History:  Diagnosis Date   Cancer ([place])    skin ca   DVT (deep venous thrombosis) ([place]) 2002   Renal calculi     Past Surgical History:  Procedure Laterality Date   IVP      Social History Social History   Tobacco Use   Smoking status: Never Smoker   Smokeless tobacco: Never Used  Vaping Use   Vaping Use: Never used  Substance Use Topics   Alcohol use: No   Drug use: No    Family History Family History  Problem Relation Age of Onset   Lupus Mother    Stroke Father    Parkinson's disease Father    Hypertension Father    Gout Father      Cancer Maternal Grandmother     Allergies  Allergen Reactions   Penicillin V Potassium Other (See Comments)   Penicillins     Other reaction(s): RASH   Strawberry Extract     Other reaction(s): RASH Other reaction(s): RASH   Wheat Bran     Other reaction(s): OTHER Other reaction(s): OTHER   Other Rash     REVIEW OF SYSTEMS (Negative unless checked)  Constitutional: [] Weight loss  [] Fever  [] Chills Cardiac: [] Chest pain   [] Chest pressure   [] Palpitations   [] Shortness of breath when laying flat   [] Shortness of breath with exertion. Vascular:  [] Pain in legs with walking   [] Pain in legs at rest  [x] History of DVT   [] Phlebitis   [] Swelling in legs   [] Varicose veins   [] Non-healing ulcers Pulmonary:   [] Uses home oxygen   [] Productive cough   [] Hemoptysis   [] Wheeze  [] COPD   [] Asthma Neurologic:  [] Dizziness   [] Seizures   [] History of stroke   [] History of TIA  [] Aphasia   [] Vissual changes   [] Weakness or numbness in arm   [] Weakness or numbness in leg Musculoskeletal:   [] Joint swelling   [x] Joint pain   [x] Low back pain Hematologic:  [] Easy bruising  [] Easy bleeding   [] Hypercoagulable state   []   Anemic Gastrointestinal:  [] Diarrhea   [] Vomiting  [] Gastroesophageal reflux/heartburn   [] Difficulty swallowing. Genitourinary:  [] Chronic kidney disease   [] Difficult urination  [] Frequent urination   [] Blood in urine Skin:  [] Rashes   [] Ulcers  Psychological:  [] History of anxiety   []  History of major depression.  Physical Examination  There were no vitals filed for this visit. There is no height or weight on file to calculate BMI. Gen: WD/WN, NAD Head: [place]/AT, No temporalis wasting.  Ear/Nose/Throat: Hearing grossly intact, nares w/o erythema or drainage Eyes: PER, EOMI, sclera nonicteric.  Neck: Supple, no large masses.   Pulmonary:  Good air movement, no audible wheezing bilaterally, no use of accessory muscles.  Cardiac: RRR, no JVD Vascular: scattered small  varicosities present bilaterally.  Very mild venous stasis changes to the legs bilaterally.  1+ soft pitting edema.  No carotid bruits Gastrointestinal: Non-distended. No guarding/no peritoneal signs.  Musculoskeletal: M/S 5/5 throughout.  No deformity or atrophy.  Neurologic: CN 2-12 intact. Symmetrical.  Speech is fluent. Motor exam as listed above. Psychiatric: Judgment intact, Mood & affect appropriate for pt's clinical situation. Dermatologic: No rashes or ulcers noted.  No changes consistent with cellulitis. Lymph : No lichenification or skin changes of chronic lymphedema.  CBC Lab Results  Component Value Date   WBC 8.2 05/28/2017   HGB 15.8 05/28/2017   HCT 47.6 (H) 05/28/2017   MCV 91.9 05/28/2017   PLT 298 05/28/2017    BMET    Component Value Date/Time   NA 137 05/28/2017 0823   NA 140 02/23/2012 2001   K 4.5 05/28/2017 0823   K 4.5 02/23/2012 2001   CL 103 05/28/2017 0823   CL 104 02/23/2012 2001   CO2 24 05/28/2017 0823   CO2 24 02/23/2012 2001   GLUCOSE 100 (H) 05/28/2017 0823   GLUCOSE 111 (H) 02/23/2012 2001   BUN 15 05/28/2017 0823   BUN 16 02/23/2012 2001   CREATININE 0.79 05/28/2017 0823   CREATININE 0.84 02/23/2012 2001   CALCIUM 9.5 05/28/2017 0823   CALCIUM 9.7 02/23/2012 2001   GFRNONAA >60 05/28/2017 0823   GFRNONAA >60 02/23/2012 2001   GFRAA >60 05/28/2017 0823   GFRAA >60 02/23/2012 2001   CrCl cannot be calculated (Patient's most recent lab result is older than the maximum 21 days allowed.).  COAG No results found for: INR, PROTIME  Radiology No results found.   Assessment/Plan 1. 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 Patient has an IVC filter indefinitely Patient is to continue taking Eliquis 5 mg 1 tab twice daily Patient is to continue engaging conservative therapy including wearing medical grade 1 compression socks, elevating her legs and remaining active  3. Primary osteoarthritis involving multiple joints This is the most likely cause of her leg pain  4. Factor V Leiden ([place]) Continue Eliquis   Hortencia Pilar, MD  02/11/2020 8:14 AM

## 2020-08-29 ENCOUNTER — Encounter: Payer: Self-pay | Admitting: Obstetrics & Gynecology

## 2020-08-29 ENCOUNTER — Other Ambulatory Visit (HOSPITAL_COMMUNITY)
Admission: RE | Admit: 2020-08-29 | Discharge: 2020-08-29 | Disposition: A | Discharge status: Home / Self Care | Payer: Medicaid Other | Source: Ambulatory Visit | Attending: Obstetrics & Gynecology | Admitting: Obstetrics & Gynecology

## 2020-08-29 ENCOUNTER — Ambulatory Visit (INDEPENDENT_AMBULATORY_CARE_PROVIDER_SITE_OTHER): Payer: Medicaid Other | Admitting: Obstetrics & Gynecology

## 2020-08-29 ENCOUNTER — Other Ambulatory Visit: Payer: Self-pay

## 2020-08-29 VITALS — BP 100/60 | Ht <= 58 in | Wt 194.0 lb

## 2020-08-29 DIAGNOSIS — N921 Excessive and frequent menstruation with irregular cycle: Secondary | ICD-10-CM

## 2020-08-29 DIAGNOSIS — Z1231 Encounter for screening mammogram for malignant neoplasm of breast: Secondary | ICD-10-CM | POA: Diagnosis not present

## 2020-08-29 DIAGNOSIS — Z124 Encounter for screening for malignant neoplasm of cervix: Secondary | ICD-10-CM | POA: Insufficient documentation

## 2020-08-29 DIAGNOSIS — Z01419 Encounter for gynecological examination (general) (routine) without abnormal findings: Secondary | ICD-10-CM

## 2020-08-29 NOTE — Patient Instructions (Signed)
PAP every three years Mammogram every year    Call [phone] to schedule at [facility] yearly (with PCP)  Thank you for choosing [facility]. As part of our ongoing efforts to improve patient experience, we would appreciate your feedback. Please fill out the short survey that you will receive by mail or MyChart. Your opinion is important to [place]! - Dr. Kenton Kingfisher   Endometrial Ablation Endometrial ablation is a procedure that destroys the thin inner layer of the lining of the uterus (endometrium). This procedure may be done:  To stop heavy menstrual periods.  To stop bleeding that is causing anemia.  To control irregular bleeding.  To treat bleeding caused by small tumors (fibroids) in the endometrium. This procedure is often done as an alternative to major surgery, such as removal of the uterus and cervix (hysterectomy). As a result of this procedure:  You may not be able to have children. However, if you have not yet gone through menopause: ? You may still have a small chance of getting pregnant. ? You will need to use a reliable method of birth control after the procedure to prevent pregnancy.  You may stop having a menstrual period, or you may have only a small amount of bleeding during your period. Menstruation may return several years after the procedure. Tell a health care provider about:  Any allergies you have.  All medicines you are taking, including vitamins, herbs, eye drops, creams, and over-the-counter medicines.  Any problems you or family members have had with the use of anesthetic medicines.  Any blood disorders you have.  Any surgeries you have had.  Any medical conditions you have.  Whether you are pregnant or may be pregnant. What are the risks? Generally, this is a safe procedure. However, problems may occur, including:  A hole (perforation) in the uterus or bowel.  Infection in the uterus, bladder, or vagina.  Bleeding.  Allergic reaction to  medicines.  Damage to nearby structures or organs.  An air bubble in the lung (air embolus).  Problems with pregnancy.  Failure of the procedure.  Decreased ability to diagnose cancer in the endometrium. Scar tissue forms after the procedure, making it more difficult to get a sample of the uterine lining. What happens before the procedure? Medicines Ask your health care provider about:  Changing or stopping your regular medicines. This is especially important if you take diabetes medicines or blood thinners.  Taking medicines such as aspirin and ibuprofen. These medicines can thin your blood. Do not take these medicines before your procedure if your doctor tells you not to take them.  Taking over-the-counter medicines, vitamins, herbs, and supplements. Tests  You will have tests of your endometrium to make sure there are no precancerous cells or cancer cells present.  You may have an ultrasound of the uterus. General instructions  Do not use any products that contain nicotine or tobacco for at least 4 weeks before the procedure. These include cigarettes, chewing tobacco, and vaping devices, such as e-cigarettes. If you need help quitting, ask your health care provider.  You may be given medicines to thin the endometrium.  Ask your health care provider what steps will be taken to help prevent infection. These steps may include: ? Removing hair at the surgery site. ? Washing skin with a germ-killing soap. ? Taking antibiotic medicine.  Plan to have a responsible adult take you home from the hospital or clinic.  Plan to have a responsible adult care for you for  the time you are told after you leave the hospital or clinic. This is important. What happens during the procedure?  You will lie on an exam table with your feet and legs supported as in a pelvic exam.  An IV will be inserted into one of your veins.  You will be given a medicine to help you relax (sedative).  A  surgical tool with a light and camera (resectoscope) will be inserted into your vagina and moved into your uterus. This allows your surgeon to see inside your uterus.  Endometrial tissue will be destroyed and removed, using one of the following methods: ? Radiofrequency. This uses an electrical current to destroy the endometrium. ? Cryotherapy. This uses extreme cold to freeze the endometrium. ? Heated fluid. This uses a heated salt and water (saline) solution to destroy the endometrium. ? Microwave. This uses high-energy microwaves to heat up the endometrium and destroy it. ? Thermal balloon. This involves inserting a catheter with a balloon tip into the uterus. The balloon tip is filled with heated fluid to destroy the endometrium. The procedure may vary among health care providers and hospitals.   What happens after the procedure?  Your blood pressure, heart rate, breathing rate, and blood oxygen level will be monitored until you leave the hospital or clinic.  You may have vaginal bleeding for 4-6 weeks after the procedure. You may also have: ? Cramps. ? A thin, watery vaginal discharge that is light pink or brown. ? A need to urinate more than usual. ? Nausea.  If you were given a sedative during the procedure, it can affect you for several hours. Do not drive or operate machinery until your health care provider says that it is safe.  Do not have sex or insert anything into your vagina until your health care provider says it is safe. Summary  Endometrial ablation is done to treat many causes of heavy menstrual bleeding. The procedure destroys the thin inner layer of the lining of the uterus (endometrium).  This procedure is often done as an alternative to major surgery, such as removal of the uterus and cervix (hysterectomy).  Plan to have a responsible adult take you home from the hospital or clinic. This information is not intended to replace advice given to you by your health care  provider. Make sure you discuss any questions you have with your health care provider. Document Revised: 01/21/2020 Document Reviewed: 01/21/2020 Elsevier Patient Education  [place].

## 2020-08-29 NOTE — Progress Notes (Signed)
HPI:      Caroline Madden is a 40 y.o. G1P1001 who LMP was Patient's last menstrual period was 08/19/2020., she presents today for her annual examination. The patient has no complaints today other than periods worsening more so these past few months but also for the last two years, whereas prior they were absent after prior ablation many years ago for several years in a good response to that ablation.  Cannot take hormones and is on blood thinner for chronic DVT.   The patient is sexually active. Her last pap: approximate date 2018 and was normal and last mammogram: patient has never had a mammogram. The patient does perform self breast exams.  There is no notable family history of breast or ovarian cancer in her family.  The patient has regular exercise: yes.  The patient denies current symptoms of depression.    GYN History: Contraception: vasectomy  PMHx: Past Medical History:  Diagnosis Date  . Cancer ([place])    skin ca  . DVT (deep venous thrombosis) ([place]) 2002  . Renal calculi    Past Surgical History:  Procedure Laterality Date  . IVP     Family History  Problem Relation Age of Onset  . Lupus Mother   . Stroke Father   . Parkinson's disease Father   . Hypertension Father   . Gout Father   . Cancer Maternal Grandmother    Social History   Tobacco Use  . Smoking status: Never Smoker  . Smokeless tobacco: Never Used  Vaping Use  . Vaping Use: Never used  Substance Use Topics  . Alcohol use: No  . Drug use: No    Current Outpatient Medications:  .  ELIQUIS 5 MG TABS tablet, TAKE 1 TABLET BY MOUTH TWICE PER DAY, Disp: 60 tablet, Rfl: 6 .  cyclobenzaprine (FLEXERIL) 10 MG tablet, Take 1 tablet (10 mg total) 3 (three) times daily as needed by mouth for muscle spasms. (Patient not taking: No sig reported), Disp: 30 tablet, Rfl: 0 .  gabapentin (NEURONTIN) 100 MG capsule, 1 po qHS (Patient not taking: No sig reported), Disp: , Rfl:  .  phentermine 37.5 MG capsule, TAKE  ONE CAPSULE BY MOUTH EVERY MORNING BEFORE BREAKFAST (Patient not taking: No sig reported), Disp: , Rfl:  .  rivaroxaban (XARELTO) 10 MG TABS tablet, Take 10 mg by mouth daily. (Patient not taking: Reported on 08/29/2020), Disp: , Rfl:  Allergies: Penicillin v potassium, Penicillins, Strawberry extract, Wheat bran, and Other  Review of Systems  Constitutional: Negative for chills, fever and malaise/fatigue.  HENT: Negative for congestion, sinus pain and sore throat.   Eyes: Negative for blurred vision and pain.  Respiratory: Negative for cough and wheezing.   Cardiovascular: Negative for chest pain and leg swelling.  Gastrointestinal: Negative for abdominal pain, constipation, diarrhea, heartburn, nausea and vomiting.  Genitourinary: Negative for dysuria, frequency, hematuria and urgency.  Musculoskeletal: Negative for back pain, joint pain, myalgias and neck pain.  Skin: Negative for itching and rash.  Neurological: Negative for dizziness, tremors and weakness.  Endo/Heme/Allergies: Does not bruise/bleed easily.  Psychiatric/Behavioral: Negative for depression. The patient is not nervous/anxious and does not have insomnia.     Objective: BP 100/60   Ht 4\' 6"  (1.372 m)   Wt 194 lb (88 kg)   LMP 08/19/2020   BMI 46.78 kg/m   Filed Weights   08/29/20 1123  Weight: 194 lb (88 kg)   Body mass index is 46.78 kg/m. Physical Exam  Constitutional:      General: She is not in acute distress.    Appearance: She is well-developed and well-nourished.  Genitourinary:     Bladder, vagina, uterus, rectum and urethral meatus normal.     There is no rash or lesion on the right labia.     There is no rash or lesion on the left labia.    No lesions in the vagina.     No vaginal bleeding.      Right Adnexa: not tender and no mass present.    Left Adnexa: not tender and no mass present.    No cervical motion tenderness, friability, lesion or polyp.     Uterus is mobile.     Uterus is not  enlarged.     No uterine mass detected.    Uterus is midaxial and midaxial.     Pelvic exam was performed with patient in the lithotomy position.  Breasts:     Right: No mass, skin change or tenderness.     Left: No mass, skin change or tenderness.    HENT:     Head: Normocephalic and atraumatic. No laceration.     Right Ear: Hearing normal.     Left Ear: Hearing normal.     Nose: No epistaxis or foreign body.     Mouth/Throat:     Mouth: Oropharynx is clear and moist and mucous membranes are normal.     Pharynx: Uvula midline.  Eyes:     Pupils: Pupils are equal, round, and reactive to light.  Neck:     Thyroid: No thyromegaly.  Cardiovascular:     Rate and Rhythm: Normal rate and regular rhythm.     Heart sounds: No murmur heard. No friction rub. No gallop.   Pulmonary:     Effort: Pulmonary effort is normal. No respiratory distress.     Breath sounds: Normal breath sounds. No wheezing.  Abdominal:     General: Bowel sounds are normal. There is no distension.     Palpations: Abdomen is soft.     Tenderness: There is no abdominal tenderness. There is no rebound.  Musculoskeletal:        General: Normal range of motion.     Cervical back: Normal range of motion and neck supple.  Neurological:     Mental Status: She is alert and oriented to person, place, and time.     Cranial Nerves: No cranial nerve deficit.  Skin:    General: Skin is warm and dry.  Psychiatric:        Mood and Affect: Mood and affect normal.        Judgment: Judgment normal.  Vitals reviewed.     Assessment:  ANNUAL EXAM 1. Women's annual routine gynecological examination   2. Screening for cervical cancer   3. Encounter for screening mammogram for malignant neoplasm of breast   4. Menometrorrhagia      Screening Plan:            1.  Cervical Screening-  Pap smear done today  2. Breast screening- Exam annually and mammogram>40 planned   3. Menometrorrhagia Options discussed Ablation  worked well for several years afterwards, now having 2 periods a month.  Not a candidate for hormonal therapy based on past DVT and chronic Eliquis use.  Poor candidate for hysterectomy for same reasons.  Plan ablation, pt prefers in office. Consent today  Patient was told that it is normal to have menstrual bleeding after an  endometrial ablation, only about 80% of patients become amenorrheic, 10% of patients have normal or light periods, and 10% of patients have no change in their bleeding pattern and may need further intervention.  She was told she will observe her periods for a few months after her ablation to see what her periods will be like; it is recommended to wait until at least three months after the procedure before making conclusions about how periods are going to be like after an ablation.  Medications discussed for before and after the procedure. Rx given.   4. Labs managed by PCP      F/U  Return in about 1 year (around 08/29/2021) for Annual.  Barnett Applebaum, MD, [facility], [facility] 08/29/2020  11:56 AM

## 2020-08-31 LAB — CYTOLOGY - PAP
Adequacy: ABSENT
Comment: NEGATIVE
Diagnosis: NEGATIVE
High risk HPV: NEGATIVE

## 2020-09-02 ENCOUNTER — Telehealth: Payer: Self-pay

## 2020-09-02 NOTE — Telephone Encounter (Signed)
-----   Message from Gae Dry, MD sent at 09/01/2020  7:57 AM EST ----- Regarding: RE: OFFICE ABLATION Sch in March week of  Mar 21 and let he know no office availability after this cycle, but will sch after anticipated next menstrual cycle  ----- Message ----- From: Wayne Both, Hortencia Martire H Sent: 08/31/2020   2:08 PM EST To: Gae Dry, MD Subject: RE: OFFICE ABLATION                            Yes, you have openings but the procedure room isn't available.  ----- Message ----- From: Gae Dry, MD Sent: 08/30/2020  12:42 PM EST To: Dyllon Henken H Shahil Speegle Subject: RE: OFFICE ABLATION                            Seems Wed there are openings 1040, 11, 1120 ----- Message ----- FromWynn Banker Sent: 08/30/2020  12:29 PM EST To: Gae Dry, MD Subject: RE: OFFICE ABLATION                            You are in clinic M, T and W and pretty full those days as is the procedure room. I have emailed [place] to see his availability. Would you consider a lunch appt? Or let me know of any other creative suggestions you may have ?  ----- Message ----- From: Gae Dry, MD Sent: 08/29/2020  11:48 AM EST To: Khadijatou Borak H Nefi Musich Subject: [place]                                Surgery Booking Request Patient Full Name:  Caroline Madden  MRN: [id number]  DOB: April 12, 1981  Surgeon: Hoyt Koch, MD  Requested Surgery Date and Time: Next week plz Primary Diagnosis AND Code: Menometrorrhagia Secondary Diagnosis and Code:  Surgical Procedure: Hysteroscopy D&C with Ablation IN OFFICE PROVEDURE Special Case Needs: MINERVA H&P: No

## 2020-09-02 NOTE — Telephone Encounter (Signed)
Called pt to adv that we didn't have openings to do procedure next week and RPH said we can wait until 3/21 or do it at [facility].  Pt said she has 2 cycles/month and that one is heavier (which she hasn't had yet). She prefers to have ablation done in the office. She said she would like to call on the first day of her next cycle. I will wait to schedule her when she calls back.

## 2020-09-13 ENCOUNTER — Other Ambulatory Visit: Payer: Self-pay | Admitting: Obstetrics & Gynecology

## 2020-09-13 DIAGNOSIS — N921 Excessive and frequent menstruation with irregular cycle: Secondary | ICD-10-CM

## 2020-09-13 DIAGNOSIS — R102 Pelvic and perineal pain: Secondary | ICD-10-CM

## 2020-09-13 NOTE — Telephone Encounter (Signed)
Pt called to advise that she started her cycle early the morning. Prior cycle was 08/19/20. She said she has been having sharp pain on her RLQ and that it was very intense last night. She wasn't sure "if Dr Kenton Kingfisher would want to do an ultrasound". I told pt that I would send him the msg as well as request what day of her cycle would be best to schedule the in office ablation. I will contact Binh/Minerva after hearing back from [facility].

## 2020-09-13 NOTE — Telephone Encounter (Signed)
Sch procedure next week if possible, and [place] soon [facility], order placed, will call w results)

## 2020-09-13 NOTE — Telephone Encounter (Signed)
Called patient to schedule Minerva ablation in office w Kaylyn Layer 3/11 @ 10:40  Caroline Madden has been contacted and will be available.  Pelvic U/S scheduled - 3/4 @ 10:00

## 2020-09-16 ENCOUNTER — Other Ambulatory Visit: Payer: Self-pay

## 2020-09-16 ENCOUNTER — Ambulatory Visit
Admission: RE | Admit: 2020-09-16 | Discharge: 2020-09-16 | Disposition: A | Discharge status: Home / Self Care | Payer: Medicaid Other | Source: Ambulatory Visit | Attending: Obstetrics & Gynecology | Admitting: Obstetrics & Gynecology

## 2020-09-16 DIAGNOSIS — N921 Excessive and frequent menstruation with irregular cycle: Secondary | ICD-10-CM | POA: Diagnosis present

## 2020-09-16 DIAGNOSIS — R102 Pelvic and perineal pain: Secondary | ICD-10-CM | POA: Diagnosis present

## 2020-09-17 ENCOUNTER — Other Ambulatory Visit: Payer: Self-pay | Admitting: Obstetrics & Gynecology

## 2020-09-17 NOTE — Progress Notes (Unsigned)
Phone call, discussion of [place] results  Review of ULTRASOUND.    I have personally reviewed images and report of recent ultrasound done at [facility].    Plan of management to be discussed with patient.  As there is a 5+ cm left ovarian cyst, and associated with pain, and at risk for torsion as it grows, and as it has been persistent for at least 2 years, then option for laparoscopy and removal discussed.  She would prefer that, as she is having pain.  She was planning Ablation for Menometrorrhagia this week, but will adjust so surgery can be done at [facility] with combined Ablation and Laparoscopy Counseled on cystectomy vs oophorectomy options Ablation still to be dione thru Hysteroscopy and Minerva  Will schedule and see for discussion and consents soon  Barnett Applebaum, MD, [place], [facility] 09/17/2020  10:23 AM

## 2020-09-19 ENCOUNTER — Telehealth: Payer: Self-pay

## 2020-09-19 NOTE — Telephone Encounter (Addendum)
Called patient to schedule hysteroscopy d&c, laparoscopic left ovarian cystectomy, possible left oophorectomy w Kenton Kingfisher  DOS 3/15  H&P 3/8 @  1:30  Covid testing 3/11 @ 8-10:30, [facility] [employer], drive up and wear mask. Advised pt to quarantine until DOS. Pt was very concerned about Covid test for fear of nose bleed. She is on blood thinners. I made note and asked PAT.  Pre-admit phone call appointment to be requested - date and time will be included on H&P paper work. Also all appointments will be updated on pt MyChart. Explained that this appointment has a call window. Based on the time scheduled will indicate if the call will be received within a 4 hour window before 1:00 or after.  Advised that pt may also receive calls from the hospital pharmacy and pre-service center.  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 No secondary insurance.  Enis Slipper is requested, Almedia Balls was emailed and replied with the following:  Unfortunately, I'll be out of town but he's pretty comfortable with the device and I'll let the OR know as well.   Thanks  [facility] [phone] cell

## 2020-09-19 NOTE — Telephone Encounter (Signed)
-----   Message from Gae Dry, MD sent at 09/17/2020 10:18 AM EST ----- Regarding: Surgery CHANGE Please cancel ablation in office for 3/11 and change to hospital surgery as listed below. Would prefer it to be 3/10 or 3/15 (prob 3/15 unless a cancellation occurs on 3/10)  Surgery Booking Request Patient Full Name:  KEYUNDRA FANT  MRN: [id number]  DOB: Dec 08, 1980  Surgeon: Hoyt Koch, MD  Requested Surgery Date and Time: 3/10 or 3/15 Primary Diagnosis AND Code: Left ovarian cyst, Pelvic pain, Menometrorrhagia Secondary Diagnosis and Code:  Surgical Procedure: Hysteroscopy D&C with Ablation    Laparoscopy    Left ovarian Cystectomy, possible left Oophorectomy RNFA Requested?: No L&D Notification: No Admission Status: same day surgery Length of Surgery: 50 min Special Case Needs: Yes - MINERVA for ablation H&P: Yes Phone Interview???:  Yes Interpreter: No Medical Clearance:  No Special Scheduling Instructions: No Any known health/anesthesia issues, diabetes, sleep apnea, latex allergy, defibrillator/pacemaker?: Yes - h/o DVT Acuity: P3   (P1 highest, P2 delay may cause harm, P3 low, elective gyn, P4 lowest)

## 2020-09-20 ENCOUNTER — Other Ambulatory Visit: Payer: Self-pay

## 2020-09-20 ENCOUNTER — Other Ambulatory Visit: Payer: Self-pay | Admitting: Obstetrics & Gynecology

## 2020-09-20 ENCOUNTER — Ambulatory Visit (INDEPENDENT_AMBULATORY_CARE_PROVIDER_SITE_OTHER): Payer: Medicaid Other | Admitting: Obstetrics & Gynecology

## 2020-09-20 ENCOUNTER — Encounter: Payer: Self-pay | Admitting: Obstetrics & Gynecology

## 2020-09-20 VITALS — BP 122/78 | HR 95 | Ht <= 58 in | Wt 191.0 lb

## 2020-09-20 DIAGNOSIS — N921 Excessive and frequent menstruation with irregular cycle: Secondary | ICD-10-CM

## 2020-09-20 DIAGNOSIS — R102 Pelvic and perineal pain: Secondary | ICD-10-CM

## 2020-09-20 DIAGNOSIS — N83202 Unspecified ovarian cyst, left side: Secondary | ICD-10-CM

## 2020-09-20 NOTE — H&P (View-Only) (Signed)
PRE-OPERATIVE HISTORY AND PHYSICAL EXAM  HPI:  Caroline Madden is a 40 y.o. G1P1001 No LMP recorded. Patient has had an ablation.; she is being admitted for surgery related to pain related to an enlargened ovarian cyst (left sided) as well as menometrorrhagia for ablation procedure.  PMHx: Past Medical History:  Diagnosis Date  . Cancer ([place])    skin ca  . DVT (deep venous thrombosis) ([place]) 2002  . Renal calculi    Past Surgical History:  Procedure Laterality Date  . IVP     Family History  Problem Relation Age of Onset  . Lupus Mother   . Stroke Father   . Parkinson's disease Father   . Hypertension Father   . Gout Father   . Cancer Maternal Grandmother    Social History   Tobacco Use  . Smoking status: Never Smoker  . Smokeless tobacco: Never Used  Vaping Use  . Vaping Use: Never used  Substance Use Topics  . Alcohol use: No  . Drug use: No    Current Outpatient Medications:  .  ELIQUIS 5 MG TABS tablet, TAKE 1 TABLET BY MOUTH TWICE PER DAY (Patient taking differently: Take 5 mg by mouth 2 (two) times daily.), Disp: 60 tablet, Rfl: 6 Allergies: Wheat bran, Penicillin v potassium, and Strawberry extract  Review of Systems  Constitutional: Negative for chills, fever and malaise/fatigue.  HENT: Negative for congestion, sinus pain and sore throat.   Eyes: Negative for blurred vision and pain.  Respiratory: Negative for cough and wheezing.   Cardiovascular: Negative for chest pain and leg swelling.  Gastrointestinal: Positive for abdominal pain. Negative for constipation, diarrhea, heartburn, nausea and vomiting.  Genitourinary: Negative for dysuria, frequency, hematuria and urgency.  Musculoskeletal: Negative for back pain, joint pain, myalgias and neck pain.  Skin: Negative for itching and rash.  Neurological: Negative for dizziness, tremors and weakness.  Endo/Heme/Allergies: Does not bruise/bleed easily.  Psychiatric/Behavioral: Negative for depression. The  patient is not nervous/anxious and does not have insomnia.     Objective: BP 122/78   Pulse 95   Wt 191 lb (86.6 kg)   BMI 46.05 kg/m   Filed Weights   09/20/20 1325  Weight: 191 lb (86.6 kg)   Physical Exam Constitutional:      General: She is not in acute distress.    Appearance: She is well-developed.  HENT:     Head: Normocephalic and atraumatic. No laceration.     Right Ear: Hearing normal.     Left Ear: Hearing normal.     Mouth/Throat:     Pharynx: Uvula midline.  Eyes:     Pupils: Pupils are equal, round, and reactive to light.  Neck:     Thyroid: No thyromegaly.  Cardiovascular:     Rate and Rhythm: Normal rate and regular rhythm.     Heart sounds: No murmur heard. No friction rub. No gallop.   Pulmonary:     Effort: Pulmonary effort is normal. No respiratory distress.     Breath sounds: Normal breath sounds. No wheezing.  Abdominal:     General: Bowel sounds are normal. There is no distension.     Palpations: Abdomen is soft.     Tenderness: There is no abdominal tenderness. There is no rebound.  Musculoskeletal:        General: Normal range of motion.     Cervical back: Normal range of motion and neck supple.  Neurological:     Mental Status: She  is alert and oriented to person, place, and time.     Cranial Nerves: No cranial nerve deficit.  Skin:    General: Skin is warm and dry.  Psychiatric:        Judgment: Judgment normal.  Vitals reviewed.     Assessment: 1. Menometrorrhagia   2. Left ovarian cyst   3. Pelvic pain   Plan laparoscopy with left ovarian cystectomy vs oophorectomy based on appearance. Also plan hysteroscopy D&C w Endometrial Ablation  Pt to hold Eliquis for 3 days up until surgery (discussed pros and cons of DVT risks, bleeding risks)  I have had a careful discussion with this patient about all the options available and the risk/benefits of each. I have fully informed this patient that surgery may subject her to a variety of  discomforts and risks: She understands that most patients have surgery with little difficulty, but problems can happen ranging from minor to fatal. These include nausea, vomiting, pain, bleeding, infection, poor healing, hernia, or formation of adhesions. Unexpected reactions may occur from any drug or anesthetic given. Unintended injury may occur to other pelvic or abdominal structures such as Fallopian tubes, ovaries, bladder, ureter (tube from kidney to bladder), or bowel. Nerves going from the pelvis to the legs may be injured. Any such injury may require immediate or later additional surgery to correct the problem. Excessive blood loss requiring transfusion is very unlikely but possible. Dangerous blood clots may form in the legs or lungs. Physical and sexual activity will be restricted in varying degrees for an indeterminate period of time but most often 2-6 weeks.  Finally, she understands that it is impossible to list every possible undesirable effect and that the condition for which surgery is done is not always cured or significantly improved, and in rare cases may be even worse.Ample time was given to answer all questions.  Barnett Applebaum, MD, [facility], [facility] 09/20/2020  1:32 PM

## 2020-09-20 NOTE — Progress Notes (Signed)
PRE-OPERATIVE HISTORY AND PHYSICAL EXAM  HPI:  Caroline Madden is a 40 y.o. G1P1001 No LMP recorded. Patient has had an ablation.; she is being admitted for surgery related to pain related to an enlargened ovarian cyst (left sided) as well as menometrorrhagia for ablation procedure.  PMHx: Past Medical History:  Diagnosis Date  . Cancer ([place])    skin ca  . DVT (deep venous thrombosis) ([place]) 2002  . Renal calculi    Past Surgical History:  Procedure Laterality Date  . IVP     Family History  Problem Relation Age of Onset  . Lupus Mother   . Stroke Father   . Parkinson's disease Father   . Hypertension Father   . Gout Father   . Cancer Maternal Grandmother    Social History   Tobacco Use  . Smoking status: Never Smoker  . Smokeless tobacco: Never Used  Vaping Use  . Vaping Use: Never used  Substance Use Topics  . Alcohol use: No  . Drug use: No    Current Outpatient Medications:  .  ELIQUIS 5 MG TABS tablet, TAKE 1 TABLET BY MOUTH TWICE PER DAY (Patient taking differently: Take 5 mg by mouth 2 (two) times daily.), Disp: 60 tablet, Rfl: 6 Allergies: Wheat bran, Penicillin v potassium, and Strawberry extract  Review of Systems  Constitutional: Negative for chills, fever and malaise/fatigue.  HENT: Negative for congestion, sinus pain and sore throat.   Eyes: Negative for blurred vision and pain.  Respiratory: Negative for cough and wheezing.   Cardiovascular: Negative for chest pain and leg swelling.  Gastrointestinal: Positive for abdominal pain. Negative for constipation, diarrhea, heartburn, nausea and vomiting.  Genitourinary: Negative for dysuria, frequency, hematuria and urgency.  Musculoskeletal: Negative for back pain, joint pain, myalgias and neck pain.  Skin: Negative for itching and rash.  Neurological: Negative for dizziness, tremors and weakness.  Endo/Heme/Allergies: Does not bruise/bleed easily.  Psychiatric/Behavioral: Negative for depression. The  patient is not nervous/anxious and does not have insomnia.     Objective: BP 122/78   Pulse 95   Wt 191 lb (86.6 kg)   BMI 46.05 kg/m   Filed Weights   09/20/20 1325  Weight: 191 lb (86.6 kg)   Physical Exam Constitutional:      General: She is not in acute distress.    Appearance: She is well-developed.  HENT:     Head: Normocephalic and atraumatic. No laceration.     Right Ear: Hearing normal.     Left Ear: Hearing normal.     Mouth/Throat:     Pharynx: Uvula midline.  Eyes:     Pupils: Pupils are equal, round, and reactive to light.  Neck:     Thyroid: No thyromegaly.  Cardiovascular:     Rate and Rhythm: Normal rate and regular rhythm.     Heart sounds: No murmur heard. No friction rub. No gallop.   Pulmonary:     Effort: Pulmonary effort is normal. No respiratory distress.     Breath sounds: Normal breath sounds. No wheezing.  Abdominal:     General: Bowel sounds are normal. There is no distension.     Palpations: Abdomen is soft.     Tenderness: There is no abdominal tenderness. There is no rebound.  Musculoskeletal:        General: Normal range of motion.     Cervical back: Normal range of motion and neck supple.  Neurological:     Mental Status: She  is alert and oriented to person, place, and time.     Cranial Nerves: No cranial nerve deficit.  Skin:    General: Skin is warm and dry.  Psychiatric:        Judgment: Judgment normal.  Vitals reviewed.     Assessment: 1. Menometrorrhagia   2. Left ovarian cyst   3. Pelvic pain   Plan laparoscopy with left ovarian cystectomy vs oophorectomy based on appearance. Also plan hysteroscopy D&C w Endometrial Ablation  Pt to hold Eliquis for 3 days up until surgery (discussed pros and cons of DVT risks, bleeding risks)  I have had a careful discussion with this patient about all the options available and the risk/benefits of each. I have fully informed this patient that surgery may subject her to a variety of  discomforts and risks: She understands that most patients have surgery with little difficulty, but problems can happen ranging from minor to fatal. These include nausea, vomiting, pain, bleeding, infection, poor healing, hernia, or formation of adhesions. Unexpected reactions may occur from any drug or anesthetic given. Unintended injury may occur to other pelvic or abdominal structures such as Fallopian tubes, ovaries, bladder, ureter (tube from kidney to bladder), or bowel. Nerves going from the pelvis to the legs may be injured. Any such injury may require immediate or later additional surgery to correct the problem. Excessive blood loss requiring transfusion is very unlikely but possible. Dangerous blood clots may form in the legs or lungs. Physical and sexual activity will be restricted in varying degrees for an indeterminate period of time but most often 2-6 weeks.  Finally, she understands that it is impossible to list every possible undesirable effect and that the condition for which surgery is done is not always cured or significantly improved, and in rare cases may be even worse.Ample time was given to answer all questions.  Barnett Applebaum, MD, [facility], [facility] 09/20/2020  1:32 PM

## 2020-09-20 NOTE — Patient Instructions (Signed)
Endometrial Ablation Endometrial ablation is a procedure that destroys the thin inner layer of the lining of the uterus (endometrium). This procedure may be done:  To stop heavy menstrual periods.  To stop bleeding that is causing anemia.  To control irregular bleeding.  To treat bleeding caused by small tumors (fibroids) in the endometrium. This procedure is often done as an alternative to major surgery, such as removal of the uterus and cervix (hysterectomy). As a result of this procedure:  You may not be able to have children. However, if you have not yet gone through menopause: ? You may still have a small chance of getting pregnant. ? You will need to use a reliable method of birth control after the procedure to prevent pregnancy.  You may stop having a menstrual period, or you may have only a small amount of bleeding during your period. Menstruation may return several years after the procedure. Tell a health care provider about:  Any allergies you have.  All medicines you are taking, including vitamins, herbs, eye drops, creams, and over-the-counter medicines.  Any problems you or family members have had with the use of anesthetic medicines.  Any blood disorders you have.  Any surgeries you have had.  Any medical conditions you have.  Whether you are pregnant or may be pregnant. What are the risks? Generally, this is a safe procedure. However, problems may occur, including:  A hole (perforation) in the uterus or bowel.  Infection in the uterus, bladder, or vagina.  Bleeding.  Allergic reaction to medicines.  Damage to nearby structures or organs.  An air bubble in the lung (air embolus).  Problems with pregnancy.  Failure of the procedure.  Decreased ability to diagnose cancer in the endometrium. Scar tissue forms after the procedure, making it more difficult to get a sample of the uterine lining. What happens before the procedure? Medicines Ask your health  care provider about:  Changing or stopping your regular medicines. This is especially important if you take diabetes medicines or blood thinners.  Taking medicines such as aspirin and ibuprofen. These medicines can thin your blood. Do not take these medicines before your procedure if your doctor tells you not to take them.  Taking over-the-counter medicines, vitamins, herbs, and supplements. Tests  You will have tests of your endometrium to make sure there are no precancerous cells or cancer cells present.  You may have an ultrasound of the uterus. General instructions  Do not use any products that contain nicotine or tobacco for at least 4 weeks before the procedure. These include cigarettes, chewing tobacco, and vaping devices, such as e-cigarettes. If you need help quitting, ask your health care provider.  You may be given medicines to thin the endometrium.  Ask your health care provider what steps will be taken to help prevent infection. These steps may include: ? Removing hair at the surgery site. ? Washing skin with a germ-killing soap. ? Taking antibiotic medicine.  Plan to have a responsible adult take you home from the hospital or clinic.  Plan to have a responsible adult care for you for the time you are told after you leave the hospital or clinic. This is important. What happens during the procedure?  You will lie on an exam table with your feet and legs supported as in a pelvic exam.  An IV will be inserted into one of your veins.  You will be given a medicine to help you relax (sedative).  A surgical tool with   a light and camera (resectoscope) will be inserted into your vagina and moved into your uterus. This allows your surgeon to see inside your uterus.  Endometrial tissue will be destroyed and removed, using one of the following methods: ? Radiofrequency. This uses an electrical current to destroy the endometrium. ? Cryotherapy. This uses extreme cold to freeze  the endometrium. ? Heated fluid. This uses a heated salt and water (saline) solution to destroy the endometrium. ? Microwave. This uses high-energy microwaves to heat up the endometrium and destroy it. ? Thermal balloon. This involves inserting a catheter with a balloon tip into the uterus. The balloon tip is filled with heated fluid to destroy the endometrium. The procedure may vary among health care providers and hospitals.   What happens after the procedure?  Your blood pressure, heart rate, breathing rate, and blood oxygen level will be monitored until you leave the hospital or clinic.  You may have vaginal bleeding for 4-6 weeks after the procedure. You may also have: ? Cramps. ? A thin, watery vaginal discharge that is light pink or brown. ? A need to urinate more than usual. ? Nausea.  If you were given a sedative during the procedure, it can affect you for several hours. Do not drive or operate machinery until your health care provider says that it is safe.  Do not have sex or insert anything into your vagina until your health care provider says it is safe. Summary  Endometrial ablation is done to treat many causes of heavy menstrual bleeding. The procedure destroys the thin inner layer of the lining of the uterus (endometrium).  This procedure is often done as an alternative to major surgery, such as removal of the uterus and cervix (hysterectomy).  Plan to have a responsible adult take you home from the hospital or clinic. This information is not intended to replace advice given to you by your health care provider. Make sure you discuss any questions you have with your health care provider. Document Revised: 01/21/2020 Document Reviewed: 01/21/2020 Elsevier Patient Education  2021 [employer].   Ovarian Cystectomy, Care After This sheet gives you information about how to care for yourself after your procedure. Your health care provider may also give you more specific  instructions. If you have problems or questions, contact your health care provider. What can I expect after the procedure? After the procedure, it is common to have:  Pain in the abdomen, especially at the incision areas. You will be given pain medicine to control the pain.  Tiredness. This is a normal part of the recovery process. Your energy level will return to normal over the next several weeks. Follow these instructions at home: Medicines  Take over-the-counter and prescription medicines only as told by your health care provider.  If you were prescribed an antibiotic medicine, use it as told by your health care provider. Do not stop using the antibiotic even if you start to feel better.  Do not take aspirin because it can cause bleeding.  Ask your health care provider if the medicine prescribed to you: ? Requires you to avoid driving or using heavy machinery. ? Can cause constipation. You may need to take these actions to prevent or treat constipation:  Drink enough fluid to keep your urine pale yellow.  Take over-the-counter or prescription medicines.  Eat foods that are high in fiber, such as beans, whole grains, and fresh fruits and vegetables.  Limit foods that are high in fat and processed sugars,  such as fried or sweet foods. Incision care  Follow instructions from your health care provider about how to take care of your incisions. Make sure you: ? Wash your hands with soap and water for at least 20 seconds before and after you change your bandage (dressing). If soap and water are not available, use hand sanitizer. ? Change your dressing as told by your health care provider. ? Leave stitches (sutures), skin glue, or adhesive strips in place. These skin closures may need to stay in place for 2 weeks or longer. If adhesive strip edges start to loosen and curl up, you may trim the loose edges. Do not remove adhesive strips completely unless your health care provider tells you  to do that.  Check your incision areas every day for signs of infection. Check for: ? More redness, swelling, or pain. ? Fluid or blood. ? Warmth. ? Pus or a bad smell.  Do not take baths, swim, or use a hot tub until your health care provider approves. Ask your health care provider if you may take showers. You may only be allowed to take sponge baths.   Activity  Rest as told by your health care provider.  Avoid sitting for a long time without moving. Get up to take short walks every 1-2 hours. This is important to improve blood flow and breathing. Ask for help if you feel weak or unsteady.  Do not lift anything that is heavier than 10 lb (4.5 kg), or the limit that you are told, until your health care provider says that it is safe.  Return to your normal activities and diet as told by your health care provider. Ask your health care provider what activities are safe for you. General instructions  Do not douche, use tampons, or have sex until your health care provider says it is okay.  Do not use any products that contain nicotine or tobacco, such as cigarettes, e-cigarettes, and chewing tobacco. These can delay incision healing after surgery. If you need help quitting, ask your health care provider.  Keep all follow-up visits. This is important. Contact a health care provider if:  You have a fever or chills.  You feel nauseous or you vomit.  You have pain when you urinate or have blood in your urine.  You have a rash on your body.  You have pain or redness where the IV was inserted.  You have pain that is not relieved with medicine.  You have any of these signs of infection: ? More redness, swelling, or pain around an incision. ? Fluid or blood coming from an incision. ? Warmth coming from an incision. ? Pus or a bad smell coming from an incision. Get help right away if:  You have chest pain or shortness of breath.  You feel dizzy or light-headed.  You have heavy  bleeding.  You have increasing abdominal pain that is not relieved with medicine.  You have pain, swelling, or redness in your leg.  Your incision is opening and the edges are not staying together. Summary  After the procedure, it is common to have some pain in your abdomen. You will be given pain medicine to control the pain.  Follow instructions from your health care provider about how to take care of your incisions.  Do not douche, use tampons, or have sex until your health care provider says it okay.  Keep all follow-up visits. This is important. This information is not intended to replace advice given  to you by your health care provider. Make sure you discuss any questions you have with your health care provider. Document Revised: 12/10/2019 Document Reviewed: 12/10/2019 Elsevier Patient Education  2021 [employer].

## 2020-09-22 ENCOUNTER — Encounter
Admission: RE | Admit: 2020-09-22 | Discharge: 2020-09-22 | Disposition: A | Discharge status: Home / Self Care | Payer: Medicaid Other | Source: Ambulatory Visit | Attending: Obstetrics & Gynecology | Admitting: Obstetrics & Gynecology

## 2020-09-22 ENCOUNTER — Other Ambulatory Visit: Payer: Self-pay

## 2020-09-22 DIAGNOSIS — Z01812 Encounter for preprocedural laboratory examination: Secondary | ICD-10-CM | POA: Diagnosis present

## 2020-09-22 DIAGNOSIS — Z20822 Contact with and (suspected) exposure to covid-19: Secondary | ICD-10-CM | POA: Insufficient documentation

## 2020-09-22 HISTORY — DX: Activated protein C resistance: D68.51

## 2020-09-22 HISTORY — DX: Anxiety disorder, unspecified: F41.9

## 2020-09-22 HISTORY — DX: Lymphedema, not elsewhere classified: I89.0

## 2020-09-22 HISTORY — DX: Personal history of urinary calculi: Z87.442

## 2020-09-22 HISTORY — DX: Headache, unspecified: R51.9

## 2020-09-22 HISTORY — DX: Other pulmonary embolism without acute cor pulmonale: I26.99

## 2020-09-22 NOTE — Pre-Procedure Instructions (Signed)
Schnier, Dolores Lory, MD  Physician  Vascular Surgery  Progress Notes     Signed  Encounter Date:  02/11/2020           Signed      Expand All Collapse All      MRN : [id number]  Caroline Madden is a 40 y.o. (1980/10/06) female who presents with chief complaint of No chief complaint on file. Marland Kitchen  History of Present Illness:    The patient presents to the office for evaluation of DVT.  DVT was identified remotely at [facility] by Duplex ultrasound.  The initial symptoms were pain and swelling in the lower extremity.  The patient continues to take Eliquis 5 mg 1 tab twice daily. The patient does have an IVC filter indefinitely.   The patient notes the left leg in particular continues to be very painful in the back just below the calf muscle.  She denies her legs  Swells.  Symptoms are much better with elevation.  The patient notes minimal edema in the morning which steadily worsens throughout the day.    The patient has not been using compression therapy at this point.  No SOB or pleuritic chest pains.  No cough or hemoptysis.  No blood per rectum or blood in any sputum.  No excessive bruising per the patient.  Previous ABI which was notable for: Right: 1.17, triphasic tibials with normal great toe forms Left: 1.05, triphasic tibials with normal great toe forms  Duplex ultrasound of the IVC and iliac veins shows the venous system is patent  Active Medications  No outpatient medications have been marked as taking for the 02/11/20 encounter (Appointment) with Delana Meyer, Dolores Lory, MD.          Past Medical History:  Diagnosis Date  . Cancer ([place])    skin ca  . DVT (deep venous thrombosis) ([place]) 2002  . Renal calculi     Past Surgical History:  Procedure Laterality Date  . IVP      Social History Social History       Tobacco Use  . Smoking status: Never Smoker  . Smokeless tobacco: Never Used  Vaping Use  . Vaping Use: Never used  Substance  Use Topics  . Alcohol use: No  . Drug use: No    Family History      Family History  Problem Relation Age of Onset  . Lupus Mother   . Stroke Father   . Parkinson's disease Father   . Hypertension Father   . Gout Father   . Cancer Maternal Grandmother          Allergies  Allergen Reactions  . Penicillin V Potassium Other (See Comments)  . Penicillins     Other reaction(s): RASH  . Strawberry Extract     Other reaction(s): RASH Other reaction(s): RASH  . Wheat Bran     Other reaction(s): OTHER Other reaction(s): OTHER  . Other Rash     REVIEW OF SYSTEMS (Negative unless checked)  Constitutional: [] ?Weight loss  [] ?Fever  [] ?Chills Cardiac: [] ?Chest pain   [] ?Chest pressure   [] ?Palpitations   [] ?Shortness of breath when laying flat   [] ?Shortness of breath with exertion. Vascular:  [] ?Pain in legs with walking   [] ?Pain in legs at rest  [x] ?History of DVT   [] ?Phlebitis   [] ?Swelling in legs   [] ?Varicose veins   [] ?Non-healing ulcers Pulmonary:   [] ?Uses home oxygen   [] ?Productive cough   [] ?Hemoptysis   [] ?Wheeze  [] ?COPD   [] ?  Asthma Neurologic:  [] ?Dizziness   [] ?Seizures   [] ?History of stroke   [] ?History of TIA  [] ?Aphasia   [] ?Vissual changes   [] ?Weakness or numbness in arm   [] ?Weakness or numbness in leg Musculoskeletal:   [] ?Joint swelling   [x] ?Joint pain   [x] ?Low back pain Hematologic:  [] ?Easy bruising  [] ?Easy bleeding   [] ?Hypercoagulable state   [] ?Anemic Gastrointestinal:  [] ?Diarrhea   [] ?Vomiting  [] ?Gastroesophageal reflux/heartburn   [] ?Difficulty swallowing. Genitourinary:  [] ?Chronic kidney disease   [] ?Difficult urination  [] ?Frequent urination   [] ?Blood in urine Skin:  [] ?Rashes   [] ?Ulcers  Psychological:  [] ?History of anxiety   [] ? History of major depression.  Physical Examination  There were no vitals filed for this visit. There is no height or weight on file to calculate BMI. Gen: WD/WN, NAD Head: [place]/AT, No  temporalis wasting.  Ear/Nose/Throat: Hearing grossly intact, nares w/o erythema or drainage Eyes: PER, EOMI, sclera nonicteric.  Neck: Supple, no large masses.   Pulmonary:  Good air movement, no audible wheezing bilaterally, no use of accessory muscles.  Cardiac: RRR, no JVD Vascular: scattered small varicosities present bilaterally.  Very mild venous stasis changes to the legs bilaterally.  1+ soft pitting edema.  No carotid bruits Gastrointestinal: Non-distended. No guarding/no peritoneal signs.  Musculoskeletal: M/S 5/5 throughout.  No deformity or atrophy.  Neurologic: CN 2-12 intact. Symmetrical.  Speech is fluent. Motor exam as listed above. Psychiatric: Judgment intact, Mood & affect appropriate for pt's clinical situation. Dermatologic: No rashes or ulcers noted.  No changes consistent with cellulitis. Lymph : No lichenification or skin changes of chronic lymphedema.  CBC Recent Labs       Lab Results  Component Value Date   WBC 8.2 05/28/2017   HGB 15.8 05/28/2017   HCT 47.6 (H) 05/28/2017   MCV 91.9 05/28/2017   PLT 298 05/28/2017      BMET Labs (Brief)          Component Value Date/Time   NA 137 05/28/2017 0823   NA 140 02/23/2012 2001   K 4.5 05/28/2017 0823   K 4.5 02/23/2012 2001   CL 103 05/28/2017 0823   CL 104 02/23/2012 2001   CO2 24 05/28/2017 0823   CO2 24 02/23/2012 2001   GLUCOSE 100 (H) 05/28/2017 0823   GLUCOSE 111 (H) 02/23/2012 2001   BUN 15 05/28/2017 0823   BUN 16 02/23/2012 2001   CREATININE 0.79 05/28/2017 0823   CREATININE 0.84 02/23/2012 2001   CALCIUM 9.5 05/28/2017 0823   CALCIUM 9.7 02/23/2012 2001   GFRNONAA >60 05/28/2017 0823   GFRNONAA >60 02/23/2012 2001   GFRAA >60 05/28/2017 0823   GFRAA >60 02/23/2012 2001     CrCl cannot be calculated (Patient's most recent lab result is older than the maximum 21 days allowed.).  COAG Recent Labs  No results found for: INR, PROTIME     Radiology Imaging Results  No results found.     Assessment/Plan 1. 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 Patient has an IVC filter indefinitely Patient is to continue taking Eliquis 5 mg 1 tab twice daily Patient is to continue engaging conservative therapy including wearing  medical grade 1 compression socks, elevating her legs and remaining active  3. Primary osteoarthritis involving multiple joints This is the most likely cause of her leg pain  4. Factor V Leiden ([place]) Continue Eliquis   Hortencia Pilar, MD  02/11/2020 8:14 AM        Electronically signed by Katha Cabal, MD at 02/11/2020 9:13 AM  Office Visit on 02/11/2020      Office Visit on 02/11/2020         Detailed Report       Note shared with patient    Additional Documentation  Vitals:  BP 118/80 (BP Location: Left Arm)   Pulse 83   Resp 16   Ht 4\' 6"  (1.372 m)   Wt 86.2 kg   BMI 45.81 kg/m   BSA 1.81 m   Pain [place] 8        More Vitals   Flowsheets:  Method of Visit,   NEWS,   MEWS Score,   Anthropometrics,   Clinical Intake     Encounter Info:  Billing Info,   History,   Allergies,   Detailed Report       Orders Placed   VAS US AORTA/IVC/ILIACS   Medication Changes     None    Medication List   Visit Diagnoses     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

## 2020-09-22 NOTE — Patient Instructions (Signed)
Your procedure is scheduled on:09-27-20 TUESDAY Report to the Registration Desk on the 1st floor of the [facility]-Then proceed to the 2nd floor Surgery Desk in the [facility] To find out your arrival time, please call [phone] between 1PM - 3PM on:09-26-20 MONDAY  REMEMBER: Instructions that are not followed completely may result in serious medical risk, up to and including death; or upon the discretion of your surgeon and anesthesiologist your surgery may need to be rescheduled.  Do not eat food after midnight the night before surgery.  No gum chewing, lozengers or hard candies.  You may however, drink CLEAR liquids up to 2 hours before you are scheduled to arrive for your surgery. Do not drink anything within 2 hours of your scheduled arrival time.  Clear liquids include: - water  - apple juice without pulp - gatorade (not RED, PURPLE, OR BLUE) - black coffee or tea (Do NOT add milk or creamers to the coffee or tea) Do NOT drink anything that is not on this list.  DO NOT TAKE ANY MEDICATION THE MORNING OF SURGERY  Follow recommendations from Cardiologist, Pulmonologist or PCP regarding stopping Aspirin, Coumadin, Plavix, Eliquis, Pradaxa, or Pletal-STOP YOUR ELIQUIS AS INSTRUCTED BY DR HARRIS 3 DAYS PRIOR TO SURGERY-LAST DOSE ON 09-23-20 FRIDAY  One week prior to surgery: Stop Anti-inflammatories (NSAIDS) such as Advil, Aleve, Ibuprofen, Motrin, Naproxen, Naprosyn and Aspirin based products such as Excedrin, Goodys Powder, BC Powder-OK TO TAKE TYLENOL IF NEEDED  Stop ANY OVER THE COUNTER supplements until after surgery.  No Alcohol for 24 hours before or after surgery.  No Smoking including e-cigarettes for 24 hours prior to surgery.  No chewable tobacco products for at least 6 hours prior to surgery.  No nicotine patches on the day of surgery.  Do not use any "recreational" drugs for at least a week prior to your surgery.  Please be advised that the combination of  cocaine and anesthesia may have negative outcomes, up to and including death. If you test positive for cocaine, your surgery will be cancelled.  On the morning of surgery brush your teeth with toothpaste and water, you may rinse your mouth with mouthwash if you wish. Do not swallow any toothpaste or mouthwash.  Do not wear jewelry, make-up, hairpins, clips or nail polish.  Do not wear lotions, powders, or perfumes.   Do not shave body from the neck down 48 hours prior to surgery just in case you cut yourself which could leave a site for infection.  Also, freshly shaved skin may become irritated if using the CHG soap.  Contact lenses, hearing aids and dentures may not be worn into surgery.  Do not bring valuables to the hospital. [facility] is not responsible for any missing/lost belongings or valuables.   Use CHG Soap as directed on instruction sheet.  Notify your doctor if there is any change in your medical condition (cold, fever, infection).  Wear comfortable clothing (specific to your surgery type) to the hospital.  Plan for stool softeners for home use; pain medications have a tendency to cause constipation. You can also help prevent constipation by eating foods high in fiber such as fruits and vegetables and drinking plenty of fluids as your diet allows.  After surgery, you can help prevent lung complications by doing breathing exercises.  Take deep breaths and cough every 1-2 hours. Your doctor may order a device called an Incentive Spirometer to help you take deep breaths. When coughing or sneezing, hold a  pillow firmly against your incision with both hands. This is called "splinting." Doing this helps protect your incision. It also decreases belly discomfort.  If you are being admitted to the hospital overnight, leave your suitcase in the car. After surgery it may be brought to your room.  If you are being discharged the day of surgery, you will not be allowed to drive  home. You will need a responsible adult (18 years or older) to drive you home and stay with you that night.   If you are taking public transportation, you will need to have a responsible adult (18 years or older) with you. Please confirm with your physician that it is acceptable to use public transportation.   Please call the [place] [facility] at [phone] if you have any questions about these instructions.  Surgery Visitation Policy:  Patients undergoing a surgery or procedure may have one family member or support person with them as long as that person is not COVID-19 positive or experiencing its symptoms.  That person may remain in the waiting area during the procedure.  Inpatient Visitation:    Visiting hours are 7 a.m. to 8 p.m. Inpatients will be allowed two visitors daily. The visitors may change each day during the patient's stay. No visitors under the age of 24. Any visitor under the age of 58 must be accompanied by an adult. The visitor must pass COVID-19 screenings, use hand sanitizer when entering and exiting the patient's room and wear a mask at all times, including in the patient's room. Patients must also wear a mask when staff or their visitor are in the room. Masking is required regardless of vaccination status.

## 2020-09-23 ENCOUNTER — Other Ambulatory Visit: Payer: Self-pay

## 2020-09-23 ENCOUNTER — Other Ambulatory Visit: Payer: Medicaid Other

## 2020-09-23 ENCOUNTER — Ambulatory Visit: Payer: Medicaid Other | Admitting: Obstetrics & Gynecology

## 2020-09-23 ENCOUNTER — Encounter
Admission: RE | Admit: 2020-09-23 | Discharge: 2020-09-23 | Disposition: A | Discharge status: Home / Self Care | Payer: Medicaid Other | Source: Ambulatory Visit | Attending: Obstetrics & Gynecology | Admitting: Obstetrics & Gynecology

## 2020-09-23 DIAGNOSIS — Z01812 Encounter for preprocedural laboratory examination: Secondary | ICD-10-CM | POA: Diagnosis not present

## 2020-09-23 DIAGNOSIS — Z20822 Contact with and (suspected) exposure to covid-19: Secondary | ICD-10-CM | POA: Diagnosis not present

## 2020-09-23 LAB — CBC
HCT: 46.6 % — ABNORMAL HIGH (ref 36.0–46.0)
Hemoglobin: 15.3 g/dL — ABNORMAL HIGH (ref 12.0–15.0)
MCH: 30.1 pg (ref 26.0–34.0)
MCHC: 32.8 g/dL (ref 30.0–36.0)
MCV: 91.6 fL (ref 80.0–100.0)
Platelets: 268 10*3/uL (ref 150–400)
RBC: 5.09 MIL/uL (ref 3.87–5.11)
RDW: 13.5 % (ref 11.5–15.5)
WBC: 6.7 10*3/uL (ref 4.0–10.5)
nRBC: 0 % (ref 0.0–0.2)

## 2020-09-23 LAB — SARS CORONAVIRUS 2 (TAT 6-24 HRS): SARS Coronavirus 2: NEGATIVE

## 2020-09-26 MED ORDER — LACTATED RINGERS IV SOLN: INTRAVENOUS | Status: DC

## 2020-09-26 MED ORDER — ORAL CARE MOUTH RINSE: 15.0000 mL | Freq: Once | OROMUCOSAL | Status: AC

## 2020-09-26 MED ORDER — CHLORHEXIDINE GLUCONATE 0.12 % MT SOLN
15.0000 mL | Freq: Once | OROMUCOSAL | Status: AC
  Administered 2020-09-27: 15 mL via OROMUCOSAL

## 2020-09-26 MED ORDER — FAMOTIDINE 20 MG PO TABS
20.0000 mg | ORAL_TABLET | Freq: Once | ORAL | Status: AC
  Administered 2020-09-27: 20 mg via ORAL

## 2020-09-26 MED ORDER — POVIDONE-IODINE 10 % EX SWAB
2.0000 "application " | Freq: Once | CUTANEOUS | Status: AC
  Administered 2020-09-27: 2 via TOPICAL

## 2020-09-27 ENCOUNTER — Ambulatory Visit: Payer: Medicaid Other | Admitting: Certified Registered Nurse Anesthetist

## 2020-09-27 ENCOUNTER — Encounter
Admission: RE | Disposition: A | Discharge status: Home / Self Care | Payer: Self-pay | Source: Home / Self Care | Attending: Obstetrics & Gynecology

## 2020-09-27 ENCOUNTER — Other Ambulatory Visit: Payer: Self-pay

## 2020-09-27 ENCOUNTER — Ambulatory Visit
Admission: RE | Admit: 2020-09-27 | Discharge: 2020-09-27 | Disposition: A | Discharge status: Home / Self Care | Payer: Medicaid Other | Attending: Obstetrics & Gynecology | Admitting: Obstetrics & Gynecology

## 2020-09-27 ENCOUNTER — Encounter: Payer: Self-pay | Admitting: Obstetrics & Gynecology

## 2020-09-27 DIAGNOSIS — Z91018 Allergy to other foods: Secondary | ICD-10-CM | POA: Insufficient documentation

## 2020-09-27 DIAGNOSIS — R102 Pelvic and perineal pain: Secondary | ICD-10-CM | POA: Diagnosis not present

## 2020-09-27 DIAGNOSIS — N838 Other noninflammatory disorders of ovary, fallopian tube and broad ligament: Secondary | ICD-10-CM | POA: Diagnosis not present

## 2020-09-27 DIAGNOSIS — N920 Excessive and frequent menstruation with regular cycle: Secondary | ICD-10-CM | POA: Diagnosis not present

## 2020-09-27 DIAGNOSIS — Z880 Allergy status to penicillin: Secondary | ICD-10-CM | POA: Insufficient documentation

## 2020-09-27 DIAGNOSIS — Z79899 Other long term (current) drug therapy: Secondary | ICD-10-CM | POA: Diagnosis not present

## 2020-09-27 DIAGNOSIS — N9489 Other specified conditions associated with female genital organs and menstrual cycle: Secondary | ICD-10-CM | POA: Diagnosis not present

## 2020-09-27 DIAGNOSIS — N83202 Unspecified ovarian cyst, left side: Secondary | ICD-10-CM

## 2020-09-27 DIAGNOSIS — Z7901 Long term (current) use of anticoagulants: Secondary | ICD-10-CM | POA: Diagnosis not present

## 2020-09-27 DIAGNOSIS — N921 Excessive and frequent menstruation with irregular cycle: Secondary | ICD-10-CM | POA: Diagnosis not present

## 2020-09-27 DIAGNOSIS — D219 Benign neoplasm of connective and other soft tissue, unspecified: Secondary | ICD-10-CM | POA: Diagnosis present

## 2020-09-27 HISTORY — PX: LAPAROSCOPIC UNILATERAL SALPINGECTOMY: SHX5934

## 2020-09-27 HISTORY — PX: LAPAROSCOPIC OVARIAN CYSTECTOMY: SHX6248

## 2020-09-27 LAB — POCT PREGNANCY, URINE: Preg Test, Ur: NEGATIVE

## 2020-09-27 SURGERY — EXCISION, CYST, OVARY, LAPAROSCOPIC
Anesthesia: General | Laterality: Left

## 2020-09-27 MED ORDER — ROCURONIUM BROMIDE 100 MG/10ML IV SOLN
INTRAVENOUS | Status: DC | PRN
  Administered 2020-09-27: 50 mg via INTRAVENOUS
  Administered 2020-09-27: 20 mg via INTRAVENOUS

## 2020-09-27 MED ORDER — ACETAMINOPHEN 325 MG PO TABS: 650.0000 mg | ORAL_TABLET | ORAL | Status: DC | PRN

## 2020-09-27 MED ORDER — MIDAZOLAM HCL 2 MG/2ML IJ SOLN
INTRAMUSCULAR | Status: AC
  Filled 2020-09-27: qty 2

## 2020-09-27 MED ORDER — FENTANYL CITRATE (PF) 100 MCG/2ML IJ SOLN
INTRAMUSCULAR | Status: AC
  Filled 2020-09-27: qty 2

## 2020-09-27 MED ORDER — SUGAMMADEX SODIUM 200 MG/2ML IV SOLN
INTRAVENOUS | Status: DC | PRN
  Administered 2020-09-27: 200 mg via INTRAVENOUS

## 2020-09-27 MED ORDER — LACTATED RINGERS IV SOLN: INTRAVENOUS | Status: DC

## 2020-09-27 MED ORDER — ACETAMINOPHEN 650 MG RE SUPP
650.0000 mg | RECTAL | Status: DC | PRN
  Filled 2020-09-27: qty 1

## 2020-09-27 MED ORDER — FENTANYL CITRATE (PF) 100 MCG/2ML IJ SOLN: 25.0000 ug | INTRAMUSCULAR | Status: DC | PRN

## 2020-09-27 MED ORDER — DIPHENHYDRAMINE HCL 50 MG/ML IJ SOLN
INTRAMUSCULAR | Status: DC | PRN
  Administered 2020-09-27: 25 mg via INTRAVENOUS

## 2020-09-27 MED ORDER — DEXAMETHASONE SODIUM PHOSPHATE 10 MG/ML IJ SOLN
INTRAMUSCULAR | Status: DC | PRN
  Administered 2020-09-27: 8 mg via INTRAVENOUS

## 2020-09-27 MED ORDER — PHENYLEPHRINE HCL (PRESSORS) 10 MG/ML IV SOLN
INTRAVENOUS | Status: DC | PRN
  Administered 2020-09-27: 100 ug via INTRAVENOUS

## 2020-09-27 MED ORDER — FENTANYL CITRATE (PF) 100 MCG/2ML IJ SOLN
INTRAMUSCULAR | Status: DC | PRN
  Administered 2020-09-27 (×2): 25 ug via INTRAVENOUS
  Administered 2020-09-27: 50 ug via INTRAVENOUS
  Administered 2020-09-27 (×2): 25 ug via INTRAVENOUS

## 2020-09-27 MED ORDER — MIDAZOLAM HCL 2 MG/2ML IJ SOLN
INTRAMUSCULAR | Status: DC | PRN
  Administered 2020-09-27: .5 mg via INTRAVENOUS
  Administered 2020-09-27: 1.5 mg via INTRAVENOUS

## 2020-09-27 MED ORDER — OXYCODONE-ACETAMINOPHEN 5-325 MG PO TABS: 1.0000 | ORAL_TABLET | ORAL | Status: DC | PRN

## 2020-09-27 MED ORDER — ONDANSETRON HCL 4 MG/2ML IJ SOLN
INTRAMUSCULAR | Status: DC | PRN
  Administered 2020-09-27: 4 mg via INTRAVENOUS

## 2020-09-27 MED ORDER — MEPERIDINE HCL 50 MG/ML IJ SOLN
12.5000 mg | Freq: Once | INTRAMUSCULAR | Status: AC
Start: 2020-09-27 — End: 2020-09-27

## 2020-09-27 MED ORDER — MORPHINE SULFATE (PF) 2 MG/ML IV SOLN: 1.0000 mg | INTRAVENOUS | Status: DC | PRN

## 2020-09-27 MED ORDER — BUPIVACAINE HCL (PF) 0.5 % IJ SOLN
INTRAMUSCULAR | Status: DC | PRN
  Administered 2020-09-27: 12 mL

## 2020-09-27 MED ORDER — ROCURONIUM BROMIDE 10 MG/ML (PF) SYRINGE
PREFILLED_SYRINGE | INTRAVENOUS | Status: AC
  Filled 2020-09-27: qty 10

## 2020-09-27 MED ORDER — FAMOTIDINE 20 MG PO TABS
ORAL_TABLET | ORAL | Status: AC
  Filled 2020-09-27: qty 1

## 2020-09-27 MED ORDER — PROPOFOL 10 MG/ML IV BOLUS
INTRAVENOUS | Status: DC | PRN
  Administered 2020-09-27: 120 mg via INTRAVENOUS

## 2020-09-27 MED ORDER — DIPHENHYDRAMINE HCL 50 MG/ML IJ SOLN
INTRAMUSCULAR | Status: AC
  Filled 2020-09-27: qty 1

## 2020-09-27 MED ORDER — LIDOCAINE HCL (PF) 2 % IJ SOLN
INTRAMUSCULAR | Status: AC
  Filled 2020-09-27: qty 5

## 2020-09-27 MED ORDER — LIDOCAINE HCL (CARDIAC) PF 100 MG/5ML IV SOSY
PREFILLED_SYRINGE | INTRAVENOUS | Status: DC | PRN
  Administered 2020-09-27: 80 mg via INTRAVENOUS

## 2020-09-27 MED ORDER — CHLORHEXIDINE GLUCONATE 0.12 % MT SOLN
OROMUCOSAL | Status: AC
  Filled 2020-09-27: qty 15

## 2020-09-27 MED ORDER — MEPERIDINE HCL 50 MG/ML IJ SOLN
INTRAMUSCULAR | Status: AC
  Administered 2020-09-27: 12.5 mg via INTRAVENOUS
  Filled 2020-09-27: qty 1

## 2020-09-27 MED ORDER — ONDANSETRON HCL 4 MG/2ML IJ SOLN: 4.0000 mg | Freq: Once | INTRAMUSCULAR | Status: DC | PRN

## 2020-09-27 MED ORDER — OXYCODONE-ACETAMINOPHEN 5-325 MG PO TABS
1.0000 | ORAL_TABLET | ORAL | 0 refills | Status: DC | PRN
Start: 2020-09-27 — End: 2021-11-03

## 2020-09-27 SURGICAL SUPPLY — 42 items
ADH SKN CLS APL DERMABOND .7 (GAUZE/BANDAGES/DRESSINGS) ×3
APL PRP STRL LF DISP 70% ISPRP (MISCELLANEOUS) ×3
BAG SPEC RTRVL LRG 6X4 10 (ENDOMECHANICALS) ×3
BLADE SURG SZ11 CARB STEEL (BLADE) ×4 IMPLANT
CATH ROBINSON RED A/P 16FR (CATHETERS) ×4 IMPLANT
CHLORAPREP W/TINT 26 (MISCELLANEOUS) ×4 IMPLANT
COVER WAND RF STERILE (DRAPES) IMPLANT
DEFOGGER SCOPE WARMER CLEARIFY (MISCELLANEOUS) ×2 IMPLANT
DERMABOND ADVANCED (GAUZE/BANDAGES/DRESSINGS) ×1
DERMABOND ADVANCED .7 DNX12 (GAUZE/BANDAGES/DRESSINGS) ×3 IMPLANT
GLOVE INDICATOR 8.0 STRL GRN (GLOVE) ×8 IMPLANT
GLOVE SURG ENC MOIS LTX SZ8 (GLOVE) ×12 IMPLANT
GOWN STRL REUS W/ TWL LRG LVL3 (GOWN DISPOSABLE) ×5 IMPLANT
GOWN STRL REUS W/ TWL XL LVL3 (GOWN DISPOSABLE) ×3 IMPLANT
GOWN STRL REUS W/TWL LRG LVL3 (GOWN DISPOSABLE) ×12
GOWN STRL REUS W/TWL XL LVL3 (GOWN DISPOSABLE) ×4
GRASPER SUT TROCAR 14GX15 (MISCELLANEOUS) ×4 IMPLANT
IRRIGATION STRYKERFLOW (MISCELLANEOUS) IMPLANT
IRRIGATOR STRYKERFLOW (MISCELLANEOUS)
IV LACTATED RINGERS 1000ML (IV SOLUTION) IMPLANT
KIT PINK PAD W/HEAD ARE REST (MISCELLANEOUS) ×4
KIT PINK PAD W/HEAD ARM REST (MISCELLANEOUS) ×3 IMPLANT
LABEL OR SOLS (LABEL) ×4 IMPLANT
MANIFOLD NEPTUNE II (INSTRUMENTS) ×4 IMPLANT
NEEDLE VERESS 14GA 120MM (NEEDLE) ×4 IMPLANT
NS IRRIG 500ML POUR BTL (IV SOLUTION) ×4 IMPLANT
PACK GYN LAPAROSCOPIC (MISCELLANEOUS) ×4 IMPLANT
PAD PREP 24X41 OB/GYN DISP (PERSONAL CARE ITEMS) ×4 IMPLANT
POUCH SPECIMEN RETRIEVAL 10MM (ENDOMECHANICALS) ×2 IMPLANT
SCISSORS METZENBAUM CVD 33 (INSTRUMENTS) ×4 IMPLANT
SET TUBE SMOKE EVAC HIGH FLOW (TUBING) ×4 IMPLANT
SHEARS HARMONIC ACE PLUS 36CM (ENDOMECHANICALS) ×2 IMPLANT
SLEEVE ENDOPATH XCEL 5M (ENDOMECHANICALS) ×2 IMPLANT
SPONGE GAUZE 2X2 8PLY STRL LF (GAUZE/BANDAGES/DRESSINGS) ×4 IMPLANT
STRAP SAFETY 5IN WIDE (MISCELLANEOUS) ×4 IMPLANT
SUT VIC AB 0 CT1 36 (SUTURE) ×6 IMPLANT
SUT VIC AB 2-0 UR6 27 (SUTURE) ×4 IMPLANT
SUT VIC AB 4-0 PS2 18 (SUTURE) ×2 IMPLANT
SYR 10ML LL (SYRINGE) ×4 IMPLANT
SYSTEM WECK SHIELD CLOSURE (TROCAR) IMPLANT
TROCAR ENDO BLADELESS 11MM (ENDOMECHANICALS) ×2 IMPLANT
TROCAR XCEL NON-BLD 5MMX100MML (ENDOMECHANICALS) ×4 IMPLANT

## 2020-09-27 NOTE — Interval H&P Note (Signed)
History and Physical Interval Note:  09/27/2020 2:54 PM  Caroline Madden  has presented today for surgery, with the diagnosis of Left ovarian cyst, Pelvic pain, Menometrorrhagia.  The various methods of treatment have been discussed with the patient and family. After consideration of risks, benefits and other options for treatment, the patient has consented to  Procedure(s): DILATATION AND CURETTAGE/HYSTEROSCOPY WITH MINERVA (N/A) LAPAROSCOPIC OVARIAN CYSTECTOMY (Left) as a surgical intervention.  The patient's history has been reviewed, patient examined, no change in status, stable for surgery.  I have reviewed the patient's chart and labs.  Questions were answered to the patient's satisfaction.     Hoyt Koch

## 2020-09-27 NOTE — Discharge Instructions (Signed)
Laparoscopy, Care After The following information offers guidance on how to care for yourself after your procedure. Your health care provider may also give you more specific instructions. If you have problems or questions, contact your health care provider. What can I expect after the procedure? After the procedure, it is common to have:  Mild discomfort in the abdomen.  Sore throat. Women who have laparoscopy with a pelvic examination may have mild cramping and fluid coming from the vagina for a few days after the procedure. Follow these instructions at home: Medicines  Take over-the-counter and prescription medicines only as told by your health care provider.  If you were prescribed an antibiotic medicine, take it as told by your health care provider. Do not stop taking the antibiotic even if you start to feel better.  Ask your health care provider if the medicine prescribed to you: ? Requires you to avoid driving or using machinery. ? Can cause constipation. You may need to take these actions to prevent or treat constipation:  Drink enough fluid to keep your urine pale yellow.  Take over-the-counter or prescription medicines.  Eat foods that are high in fiber, such as beans, whole grains, and fresh fruits and vegetables.  Limit foods that are high in fat and processed sugars, such as fried or sweet foods. Incision care  Follow instructions from your health care provider about how to take care of your incisions. Make sure you: ? Wash your hands with soap and water for at least 20 seconds before and after you change your bandage (dressing). If soap and water are not available, use hand sanitizer. ? Change your dressing as told by your health care provider. ? Leave stitches (sutures), skin glue, or surgical tape in place. These skin closures may need to stay in place for 2 weeks or longer. If surgical tape edges start to loosen and curl up, you may trim the loose edges. Do not remove  the surgical tape completely unless your health care provider tells you to do that.  Check your incision areas every day for signs of infection. Check for: ? Redness, swelling, or pain. ? Fluid or blood. ? Warmth. ? Pus or a bad smell.   Activity  Return to your normal activities as told by your health care provider. Ask your health care provider what activities are safe for you.  Do not lift anything that is heavier than 10 lb (4.5 kg), or the limit that you are told, until your health care provider says that it is safe.  Avoid sitting for a long time without moving. Get up to take short walks every 1-2 hours. This is important to improve blood flow and breathing. Ask for help if you feel weak or unsteady. General instructions  Do not use any products that contain nicotine or tobacco. These products include cigarettes, chewing tobacco, and vaping devices, such as e-cigarettes. If you need help quitting, ask your health care provider.  If you were given a sedative during the procedure, it can affect you for several hours. Do not drive or operate machinery until your health care provider says that it is safe.  Do not take baths, swim, or use a hot tub until your health care provider approves. Ask your health care provider if you may take showers. You may only be allowed to take sponge baths.  Keep all follow-up visits. This is important. Contact a health care provider if:  You develop shoulder pain.  You feel light-headed or faint.  You are unable to pass gas or have a bowel movement.  You feel nauseous or you vomit.  You develop a rash.  You have any of these signs of infection: ? Redness, swelling, or pain around an incision. ? Fluid or blood coming from an incision. ? Warmth coming from an incision. ? Pus or a bad smell coming from an incision. ? A fever or chills. Get help right away if:  You have severe pain.  You have vomiting that does not go away.  You have heavy  bleeding from the vagina.  Any incision opens up.  You have trouble breathing.  You have chest pain. These symptoms may represent a serious problem that is an emergency. Do not wait to see if the symptoms will go away. Get medical help right away. Call your local emergency services (911 in the [place] Do not drive yourself to the hospital. Summary  After the procedure, it is common to have mild discomfort in the abdomen and a sore throat.  Check your incision areas every day for signs of infection.  Return to your normal activities as told by your health care provider. Ask your health care provider what activities are safe for you. This information is not intended to replace advice given to you by your health care provider. Make sure you discuss any questions you have with your health care provider. Document Revised: 02/26/2020 Document Reviewed: 02/26/2020 Elsevier Patient Education  2021 [employer]   1) The drugs that you were given will stay in your system until tomorrow so for the next 24 hours you should not:  A) Drive an automobile B) Make any legal decisions C) Drink any alcoholic beverage   2) You may resume regular meals tomorrow.  Today it is better to start with liquids and gradually work up to solid foods.  You may eat anything you prefer, but it is better to start with liquids, then soup and crackers, and gradually work up to solid foods.   3) Please notify your doctor immediately if you have any unusual bleeding, trouble breathing, redness and pain at the surgery site, drainage, fever, or pain not relieved by medication.    4) Additional Instructions:        Please contact your physician with any problems or Same Day Surgery at [phone], Monday through Friday 6 am to 4 pm, or [place] at [facility] number at [phone].

## 2020-09-27 NOTE — Anesthesia Preprocedure Evaluation (Signed)
Anesthesia Evaluation  Patient identified by MRN, date of birth, ID band Patient awake    Reviewed: Allergy & Precautions, NPO status , Patient's Chart, lab work & pertinent test results  Airway Mallampati: III  TM Distance: <3 FB     Dental   Pulmonary shortness of breath, PE   Pulmonary exam normal        Cardiovascular + DVT  Normal cardiovascular exam     Neuro/Psych  Headaches, Anxiety    GI/Hepatic negative GI ROS, Neg liver ROS,   Endo/Other  negative endocrine ROS  Renal/GU   Female GU complaint     Musculoskeletal  (+) Arthritis , Osteoarthritis,    Abdominal Normal abdominal exam  (+)   Peds negative pediatric ROS (+)  Hematology   Anesthesia Other Findings Past Medical History: No date: Anxiety     Comment:  NO MEDS No date: Cancer [facility])     Comment:  skin ca 2002: DVT (deep venous thrombosis) (HCC) No date: Factor V Leiden ([place]) No date: Headache     Comment:  H/O MIGRAINES No date: History of kidney stones No date: Lymphedema     Comment:  right leg-uses lymphedema pump No date: PE (pulmonary thromboembolism) ([place])  Reproductive/Obstetrics                             Anesthesia Physical Anesthesia Plan  ASA: III  Anesthesia Plan: General   Post-op Pain Management:    Induction: Intravenous  PONV Risk Score and Plan:   Airway Management Planned: Oral ETT  Additional Equipment:   Intra-op Plan:   Post-operative Plan:   Informed Consent: I have reviewed the patients History and Physical, chart, labs and discussed the procedure including the risks, benefits and alternatives for the proposed anesthesia with the patient or authorized representative who has indicated his/her understanding and acceptance.     Dental advisory given  Plan Discussed with: CRNA and Surgeon  Anesthesia Plan Comments:         Anesthesia Quick Evaluation

## 2020-09-27 NOTE — Anesthesia Postprocedure Evaluation (Signed)
Anesthesia Post Note  Patient: Caroline Madden  Procedure(s) Performed: LAPAROSCOPIC OVARIAN CYSTECTOMY (Left ) LAPAROSCOPIC UNILATERAL SALPINGECTOMY (Left ) EXAM UNDER ANESTHESIA  Patient location during evaluation: PACU Anesthesia Type: General Level of consciousness: awake and alert Pain management: pain level controlled Vital Signs Assessment: post-procedure vital signs reviewed and stable Respiratory status: spontaneous breathing, nonlabored ventilation, respiratory function stable and patient connected to nasal cannula oxygen Cardiovascular status: blood pressure returned to baseline and stable Postop Assessment: no apparent nausea or vomiting Anesthetic complications: no   No complications documented.   Last Vitals:  Vitals:   09/27/20 1758 09/27/20 1802  BP:  (!) 143/90  Pulse: 97 97  Resp: 16 18  Temp: 37.6 C 37.6 C  SpO2: 97% 100%    Last Pain:  Vitals:   09/27/20 1802  TempSrc: Temporal  PainSc: 0-No pain                 Arita Miss

## 2020-09-27 NOTE — Transfer of Care (Signed)
Immediate Anesthesia Transfer of Care Note  Patient: Caroline Madden  Procedure(s) Performed: LAPAROSCOPIC OVARIAN CYSTECTOMY (Left ) LAPAROSCOPIC UNILATERAL SALPINGECTOMY (Left ) EXAM UNDER ANESTHESIA  Patient Location: PACU  Anesthesia Type:General  Level of Consciousness: awake, alert  and oriented  Airway & Oxygen Therapy: Patient Spontanous Breathing and Patient connected to face mask oxygen  Post-op Assessment: Report given to RN and Post -op Vital signs reviewed and stable  Post vital signs: Reviewed and stable  Last Vitals:  Vitals Value Taken Time  BP 138/118 09/27/20 1715  Temp    Pulse 107 09/27/20 1719  Resp 34 09/27/20 1719  SpO2 99 % 09/27/20 1719  Vitals shown include unvalidated device data.  Last Pain:  Vitals:   09/27/20 1240  TempSrc: Temporal  PainSc: 0-No pain         Complications: No complications documented.

## 2020-09-27 NOTE — Op Note (Signed)
  Operative Note   09/27/2020  PRE-OP DIAGNOSIS: Left Tubal Cyst, Pelvic Pain, Menorrhagia  POST-OP DIAGNOSIS: same   PROCEDURE: Procedure(s): LAPAROSCOPIC CYSTECTOMY )Tubal vs Ovarian) LAPAROSCOPIC UNILATERAL SALPINGECTOMY EXAM UNDER ANESTHESIA   SURGEON: Barnett Applebaum, MD, FACOG  ANESTHESIA: Choice   ESTIMATED BLOOD LOSS: Minimal  COMPLICATIONS: None  DISPOSITION: PACU - hemodynamically stable.  CONDITION: stable  FINDINGS: Laparoscopic survey of the abdomen revealed left ovary normal yet cystic atructures (2) adjacent and involving the tube, and righht ovary normal, and uterus w multiple small fibroids, and grossly normal liver edge, gallbladder edge and appendix, No intra-abdominal adhesions were noted. Unable to perform hysteroscopy during exam under anesthesia due to angle of cervix and inability to dilate effectively.  PROCEDURE IN DETAIL: The patient was taken to the OR where anesthesia was administed. The patient was positioned in dorsal lithotomy in the [place]. The patient was then examined under anesthesia with the above noted findings. The patient was prepped and draped in the normal sterile fashion and foley catheter was placed. A Graves speculum was placed in the vagina and the anterior lip of the cervix was grasped with a single toothed tenaculum. A H[place]ka uterine manipulator was then inserted in the uterus. Uterine mobility was found to be satisfactory. The speculum was then removed.  Attention was turned to the patient's abdomen where a 5 mm skin incision was made in the umbilical fold, after injection of local anesthesia. The Veress step needle was carefully introduced into the peritoneal cavity with placement confirmed using the hanging drop technique.  Pneumoperitoneum was obtained. The 5 mm port was then placed under direct visualization with the operative laparoscope.  Trendelenburg positioning.  Additional 17mm trocar was then placed in the RLQ lateral to the  inferior epigastric blood vessels under direct visualization with the laparoscope.  Instrumentation to visualize complete pelvic anatomy performed.  A 52mm trocar was also then placed in the suprapubic region.  The cystic mass along the left adnexa is identified and stabilized.  The mass is dissected, with vasculature coagulated using the Harmonic scapel, with excision of tube and cystic components.  Ureter and bowel out of harms way.  Hemostasis is visualized and assured.  Contralateral ovary seen as normal.  Pelvic cavity is cleaned with any fluid aspirated.  Instruments and trocars removed, gas expelled, and skin closed with skin adhesive glue.  Instrument, needle, and sponge counts correct x2 at the conclusion of the case.    Exam at vagina reveals a cervix that has a anteverted angle, is stenotic, and is unable to be dilated to adequately perform hysteroscopy, and thus this procedure is not done.  Pt goes to recovery room in stable condition.  Barnett Applebaum, MD, [facility], [facility] 09/27/2020  5:13 PM

## 2020-09-27 NOTE — Anesthesia Procedure Notes (Signed)
Procedure Name: Intubation Date/Time: 09/27/2020 3:50 PM Performed by: Allean Found, CRNA Pre-anesthesia Checklist: Patient identified, Patient being monitored, Timeout performed, Emergency Drugs available and Suction available Patient Re-evaluated:Patient Re-evaluated prior to induction Oxygen Delivery Method: Circle system utilized Preoxygenation: Pre-oxygenation with 100% oxygen Induction Type: IV induction Ventilation: Mask ventilation without difficulty Laryngoscope Size: 3 and McGraph Grade View: Grade I Tube type: Oral Tube size: 7.0 mm Number of attempts: 1 Airway Equipment and Method: Stylet Placement Confirmation: ETT inserted through vocal cords under direct vision,  positive ETCO2 and breath sounds checked- equal and bilateral Secured at: 21 cm Tube secured with: Tape Dental Injury: Teeth and Oropharynx as per pre-operative assessment

## 2020-09-28 ENCOUNTER — Encounter: Payer: Self-pay | Admitting: Obstetrics & Gynecology

## 2020-09-29 LAB — SURGICAL PATHOLOGY

## 2020-10-10 ENCOUNTER — Ambulatory Visit (INDEPENDENT_AMBULATORY_CARE_PROVIDER_SITE_OTHER): Payer: Medicaid Other | Admitting: Obstetrics & Gynecology

## 2020-10-10 ENCOUNTER — Other Ambulatory Visit: Payer: Self-pay

## 2020-10-10 ENCOUNTER — Encounter: Payer: Self-pay | Admitting: Obstetrics & Gynecology

## 2020-10-10 VITALS — BP 120/70 | Ht <= 58 in | Wt 187.0 lb

## 2020-10-10 DIAGNOSIS — R102 Pelvic and perineal pain: Secondary | ICD-10-CM

## 2020-10-10 DIAGNOSIS — N921 Excessive and frequent menstruation with irregular cycle: Secondary | ICD-10-CM

## 2020-10-10 NOTE — Progress Notes (Signed)
  Postoperative Follow-up Patient presents post op from laparoscopy and left cystectomy, attempted ablation for pelvic pain and menometorirrhagia, 2 weeks ago. Path: DIAGNOSIS:  A. FALLOPIAN TUBE WITH CYST, LEFT; SALPINGECTOMY:  - FALLOPIAN TUBE WITH BENIGN PARATUBAL CYST.  - CONGESTED AND MILDLY EDEMATOUS FIMBRIAE.  - SEPARATE FRAGMENT OF OVARIAN TISSUE WITH CORPUS LUTEUM.  - NEGATIVE FOR MALIGNANCY.  Images:    Subjective: Patient reports some improvement in her preop symptoms. Eating a regular diet without difficulty. Pain is controlled without any medications.  Activity: normal activities of daily living. Patient reports additional symptom's since surgery of irritation around incisions and intolerence to percocet.  Objective: BP 120/70   Ht 4\' 6"  (1.372 m)   Wt 187 lb (84.8 kg)   LMP 09/13/2020 (Exact Date)   BMI 45.09 kg/m  Physical Exam Constitutional:      General: She is not in acute distress.    Appearance: She is well-developed.  Cardiovascular:     Rate and Rhythm: Normal rate.  Pulmonary:     Effort: Pulmonary effort is normal.  Abdominal:     General: There is no distension.     Palpations: Abdomen is soft.     Tenderness: There is no abdominal tenderness.     Comments: Incision Healing Well   Musculoskeletal:        General: Normal range of motion.  Neurological:     Mental Status: She is alert and oriented to person, place, and time.     Cranial Nerves: No cranial nerve deficit.  Skin:    General: Skin is warm and dry.     Assessment: s/p :  laparoscopy and cystectomy and attempted ablation but not able to be done due to cervix stnosis and possibly the fibroids noted on her laparoscopy as extensive although small (numerous ones).  She cannot utilize hormones based on her h/o DVT and she is on Eliquis; stable  Plan: Patient has done well after surgery with no apparent complications.  I have discussed the post-operative course to date, and the expected  progress moving forward.  The patient understands what complications to be concerned about.  I will see the patient in routine follow up, or sooner if needed.    Activity plan: No restriction.  Findings at surgery discussed Options for management include a carefully planned laparoscopic hysterectomy, and its pros and cons are discussed along w recovery.  Info given.  Hoyt Koch 10/10/2020, 2:25 PM

## 2021-02-06 ENCOUNTER — Ambulatory Visit (INDEPENDENT_AMBULATORY_CARE_PROVIDER_SITE_OTHER): Payer: Medicaid Other | Admitting: Vascular Surgery

## 2021-02-06 ENCOUNTER — Ambulatory Visit (INDEPENDENT_AMBULATORY_CARE_PROVIDER_SITE_OTHER): Payer: Medicaid Other

## 2021-02-06 ENCOUNTER — Other Ambulatory Visit: Payer: Self-pay

## 2021-02-06 VITALS — BP 118/82 | HR 71 | Ht <= 58 in | Wt 180.0 lb

## 2021-02-06 DIAGNOSIS — M159 Polyosteoarthritis, unspecified: Secondary | ICD-10-CM

## 2021-02-06 DIAGNOSIS — D6851 Activated protein C resistance: Secondary | ICD-10-CM | POA: Diagnosis not present

## 2021-02-06 DIAGNOSIS — I87009 Postthrombotic syndrome without complications of unspecified extremity: Secondary | ICD-10-CM | POA: Diagnosis not present

## 2021-02-06 DIAGNOSIS — I82521 Chronic embolism and thrombosis of right iliac vein: Secondary | ICD-10-CM

## 2021-02-06 DIAGNOSIS — M8949 Other hypertrophic osteoarthropathy, multiple sites: Secondary | ICD-10-CM

## 2021-02-06 NOTE — Progress Notes (Signed)
MRN : [id number]  Caroline Madden is a 40 y.o. (31-May-1981) female who presents with chief complaint of  Chief Complaint  Patient presents with   Follow-up    1 yr IVC/ iliac   .  History of Present Illness:    The patient presents to the office for follow-up evaluation of recurrent DVT.  DVT was initially identified approximately 15 years ago.  Early on in the course of her recurrent DVTs and IVC filter was placed she does not recall exactly when.  Filter was never removed.  We are uncertain as to the type of filter.  DVT was identified remotely by Duplex ultrasound.  The initial symptoms were pain and swelling in the lower extremity.   The patient continues to take Eliquis 5 mg 1 tab twice daily.  The patient does have an IVC filter indefinitely.  She denies any major changes in her overall health care this past year.   The patient notes the left leg in particular continues to be very painful in the back just below the calf muscle.  She denies her legs  Swells.  Symptoms are much better with elevation.  The patient notes minimal edema in the morning which steadily worsens throughout the day.     The patient has been using compression therapy at this point.   No SOB or pleuritic chest pains.  No cough or hemoptysis.   No blood per rectum or blood in any sputum.  No excessive bruising per the patient.   Previous ABI which was notable for: Right: 1.17, triphasic tibials with normal great toe forms Left: 1.05, triphasic tibials with normal great toe forms   Duplex ultrasound of the IVC and iliac veins performed today shows the inferior vena cava remains patent.  The right common iliac vein is notable for a previously placed stent which appears occluded with significant collaterals identified.  The left common iliac vein is patent  Current Meds  Medication Sig   ELIQUIS 5 MG TABS tablet TAKE 1 TABLET BY MOUTH TWICE PER DAY (Patient taking differently: Take 5 mg by mouth 2 (two) times  daily.)    Past Medical History:  Diagnosis Date   Anxiety    NO MEDS   Cancer ([place])    skin ca   DVT (deep venous thrombosis) ([place]) 2002   Factor V Leiden ([place])    Headache    H/O MIGRAINES   History of kidney stones    Lymphedema    right leg-uses lymphedema pump   PE (pulmonary thromboembolism) ([place])     Past Surgical History:  Procedure Laterality Date   CYST EXCISION  08/20/2019   Scalp   GANGLION CYST EXCISION     IVC FILTER INSERTION     LAPAROSCOPIC OVARIAN CYSTECTOMY Left 09/27/2020   Procedure: LAPAROSCOPIC OVARIAN CYSTECTOMY;  Surgeon: Gae Dry, MD;  Location: [facility] ORS;  Service: Gynecology;  Laterality: Left;   LAPAROSCOPIC UNILATERAL SALPINGECTOMY Left 09/27/2020   Procedure: LAPAROSCOPIC UNILATERAL SALPINGECTOMY;  Surgeon: Gae Dry, MD;  Location: [facility] ORS;  Service: Gynecology;  Laterality: Left;   WISDOM TOOTH EXTRACTION      Social History Social History   Tobacco Use   Smoking status: Never   Smokeless tobacco: Never  Vaping Use   Vaping Use: Never used  Substance Use Topics   Alcohol use: No   Drug use: No    Family History Family History  Problem Relation Age of Onset   Lupus Mother  Stroke Father    Parkinson's disease Father    Hypertension Father    Gout Father    Cancer Maternal Grandmother     Allergies  Allergen Reactions   Wheat Bran Diarrhea    bloating   Penicillin V Potassium Rash   Strawberry Extract Rash     REVIEW OF SYSTEMS (Negative unless checked)  Constitutional: '[]'$ Weight loss  '[]'$ Fever  '[]'$ Chills Cardiac: '[]'$ Chest pain   '[]'$ Chest pressure   '[]'$ Palpitations   '[]'$ Shortness of breath when laying flat   '[]'$ Shortness of breath with exertion. Vascular:  '[]'$ Pain in legs with walking   '[]'$ Pain in legs at rest  '[]'$ History of DVT   '[]'$ Phlebitis   '[]'$ Swelling in legs   '[]'$ Varicose veins   '[]'$ Non-healing ulcers Pulmonary:   '[]'$ Uses home oxygen   '[]'$ Productive cough   '[]'$ Hemoptysis   '[]'$ Wheeze  '[]'$ COPD   '[]'$ Asthma Neurologic:   '[]'$ Dizziness   '[]'$ Seizures   '[]'$ History of stroke   '[]'$ History of TIA  '[]'$ Aphasia   '[]'$ Vissual changes   '[]'$ Weakness or numbness in arm   '[]'$ Weakness or numbness in leg Musculoskeletal:   '[]'$ Joint swelling   '[]'$ Joint pain   '[]'$ Low back pain Hematologic:  '[]'$ Easy bruising  '[]'$ Easy bleeding   '[]'$ Hypercoagulable state   '[]'$ Anemic Gastrointestinal:  '[]'$ Diarrhea   '[]'$ Vomiting  '[]'$ Gastroesophageal reflux/heartburn   '[]'$ Difficulty swallowing. Genitourinary:  '[]'$ Chronic kidney disease   '[]'$ Difficult urination  '[]'$ Frequent urination   '[]'$ Blood in urine Skin:  '[]'$ Rashes   '[]'$ Ulcers  Psychological:  '[]'$ History of anxiety   '[]'$  History of major depression.  Physical Examination  Vitals:   02/06/21 0831  BP: 118/82  Pulse: 71  Weight: 180 lb (81.6 kg)  Height: '4\' 6"'$  (1.372 m)   Body mass index is 43.4 kg/m. Gen: WD/WN, NAD Head: [place]/AT, No temporalis wasting.  Ear/Nose/Throat: Hearing grossly intact, nares w/o erythema or drainage Eyes: PER, EOMI, sclera nonicteric.  Neck: Supple, no large masses.   Pulmonary:  Good air movement, no audible wheezing bilaterally, no use of accessory muscles.  Cardiac: RRR, no JVD Vascular: scattered varicosities present bilaterally.  Mild venous stasis changes to the legs bilaterally.  2+ soft pitting edema  Vessel Right Left  Radial Palpable Palpable  Gastrointestinal: Non-distended. No guarding/no peritoneal signs.  Musculoskeletal: M/S 5/5 throughout.  No deformity or atrophy.  Neurologic: CN 2-12 intact. Symmetrical.  Speech is fluent. Motor exam as listed above. Psychiatric: Judgment intact, Mood & affect appropriate for pt's clinical situation. Dermatologic: Mild venous rashes no ulcers noted.  No changes consistent with cellulitis. Lymph : No lichenification or skin changes of chronic lymphedema.  CBC Lab Results  Component Value Date   WBC 6.7 09/23/2020   HGB 15.3 (H) 09/23/2020   HCT 46.6 (H) 09/23/2020   MCV 91.6 09/23/2020   PLT 268 09/23/2020    BMET    Component  Value Date/Time   NA 137 05/28/2017 0823   NA 140 02/23/2012 2001   K 4.5 05/28/2017 0823   K 4.5 02/23/2012 2001   CL 103 05/28/2017 0823   CL 104 02/23/2012 2001   CO2 24 05/28/2017 0823   CO2 24 02/23/2012 2001   GLUCOSE 100 (H) 05/28/2017 0823   GLUCOSE 111 (H) 02/23/2012 2001   BUN 15 05/28/2017 0823   BUN 16 02/23/2012 2001   CREATININE 0.79 05/28/2017 0823   CREATININE 0.84 02/23/2012 2001   CALCIUM 9.5 05/28/2017 0823   CALCIUM 9.7 02/23/2012 2001   GFRNONAA >60 05/28/2017 0823   GFRNONAA >60 02/23/2012 2001  GFRAA >60 05/28/2017 0823   GFRAA >60 02/23/2012 2001   CrCl cannot be calculated (Patient's most recent lab result is older than the maximum 21 days allowed.).  COAG No results found for: INR, PROTIME  Radiology No results found.   Assessment/Plan 1. 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 Patient has an IVC filter indefinitely Patient is to continue taking Eliquis 5 mg 1 tab twice daily Patient is to continue engaging conservative therapy including wearing medical grade 1 compression socks, elevating her legs and remaining active - VAS US AORTA/IVC/ILIACS; Future   2. 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 Patient has an IVC filter indefinitely Patient is to continue taking Eliquis 5 mg 1 tab twice daily Patient is to continue engaging conservative therapy including wearing medical grade 1 compression socks, elevating her legs and remaining active   3. Primary osteoarthritis involving multiple joints This is the most likely cause of her leg pain   4. Factor V Leiden ([place]) Continue Eliquis lifelong   Hortencia Pilar, MD  02/06/2021 8:49 AM

## 2021-02-07 ENCOUNTER — Encounter (INDEPENDENT_AMBULATORY_CARE_PROVIDER_SITE_OTHER): Payer: Self-pay | Admitting: Vascular Surgery

## 2021-04-23 ENCOUNTER — Emergency Department: Payer: Medicaid Other

## 2021-04-23 ENCOUNTER — Emergency Department
Admission: EM | Admit: 2021-04-23 | Discharge: 2021-04-23 | Disposition: A | Discharge status: Home / Self Care | Payer: Medicaid Other | Attending: Emergency Medicine | Admitting: Emergency Medicine

## 2021-04-23 ENCOUNTER — Other Ambulatory Visit: Payer: Self-pay

## 2021-04-23 DIAGNOSIS — L0390 Cellulitis, unspecified: Secondary | ICD-10-CM

## 2021-04-23 DIAGNOSIS — R1900 Intra-abdominal and pelvic swelling, mass and lump, unspecified site: Secondary | ICD-10-CM | POA: Diagnosis not present

## 2021-04-23 DIAGNOSIS — R1909 Other intra-abdominal and pelvic swelling, mass and lump: Secondary | ICD-10-CM

## 2021-04-23 DIAGNOSIS — M7989 Other specified soft tissue disorders: Secondary | ICD-10-CM | POA: Diagnosis present

## 2021-04-23 DIAGNOSIS — Z85828 Personal history of other malignant neoplasm of skin: Secondary | ICD-10-CM | POA: Diagnosis not present

## 2021-04-23 DIAGNOSIS — Z7901 Long term (current) use of anticoagulants: Secondary | ICD-10-CM | POA: Insufficient documentation

## 2021-04-23 DIAGNOSIS — T148XXA Other injury of unspecified body region, initial encounter: Secondary | ICD-10-CM

## 2021-04-23 LAB — PREGNANCY, URINE: Preg Test, Ur: NEGATIVE

## 2021-04-23 LAB — CBC WITH DIFFERENTIAL/PLATELET
Abs Immature Granulocytes: 0.02 10*3/uL (ref 0.00–0.07)
Basophils Absolute: 0 10*3/uL (ref 0.0–0.1)
Basophils Relative: 0 %
Eosinophils Absolute: 0.1 10*3/uL (ref 0.0–0.5)
Eosinophils Relative: 1 %
HCT: 41.7 % (ref 36.0–46.0)
Hemoglobin: 14.5 g/dL (ref 12.0–15.0)
Immature Granulocytes: 0 %
Lymphocytes Relative: 30 %
Lymphs Abs: 2.7 10*3/uL (ref 0.7–4.0)
MCH: 32.4 pg (ref 26.0–34.0)
MCHC: 34.8 g/dL (ref 30.0–36.0)
MCV: 93.1 fL (ref 80.0–100.0)
Monocytes Absolute: 0.7 10*3/uL (ref 0.1–1.0)
Monocytes Relative: 7 %
Neutro Abs: 5.4 10*3/uL (ref 1.7–7.7)
Neutrophils Relative %: 62 %
Platelets: 255 10*3/uL (ref 150–400)
RBC: 4.48 MIL/uL (ref 3.87–5.11)
RDW: 13.2 % (ref 11.5–15.5)
WBC: 9 10*3/uL (ref 4.0–10.5)
nRBC: 0 % (ref 0.0–0.2)

## 2021-04-23 LAB — BASIC METABOLIC PANEL
Anion gap: 6 (ref 5–15)
BUN: 20 mg/dL (ref 6–20)
CO2: 26 mmol/L (ref 22–32)
Calcium: 8.9 mg/dL (ref 8.9–10.3)
Chloride: 107 mmol/L (ref 98–111)
Creatinine, Ser: 0.59 mg/dL (ref 0.44–1.00)
GFR, Estimated: >60 mL/min (ref >60)
Glucose, Bld: 87 mg/dL (ref 70–99)
Potassium: 3.7 mmol/L (ref 3.5–5.1)
Sodium: 139 mmol/L (ref 135–145)

## 2021-04-23 MED ORDER — IOHEXOL 350 MG/ML SOLN
80.0000 mL | Freq: Once | INTRAVENOUS | Status: AC | PRN
  Administered 2021-04-23: 80 mL via INTRAVENOUS

## 2021-04-23 NOTE — ED Provider Notes (Signed)
[facility] Emergency Department Provider Note ____________________________________________   Event Date/Time   First MD Initiated Contact with Patient 04/23/21 1307     (approximate)  I have reviewed the triage vital signs and the nursing notes.   HISTORY  Chief Complaint Leg Swelling    HPI Caroline Madden is a 40 y.o. female with PMH as noted below including factor V Leiden, DVT and PE, currently on Eliquis, who presents with painful swelling to her right groin and suprapubic area going down into her right thigh, and occurring over the last 2 to 3 days.  The patient denies any chest pain, difficulty breathing, vomiting, diarrhea, fever or chills, or any numbness or weakness to the leg.  She denies any prior history of similar pain.  The pain started when she lifted something, so she is concerned she may have a hernia.   Past Medical History:  Diagnosis Date   Anxiety    NO MEDS   Cancer ([place])    skin ca   DVT (deep venous thrombosis) ([place]) 2002   Factor V Leiden ([place])    Headache    H/O MIGRAINES   History of kidney stones    Lymphedema    right leg-uses lymphedema pump   PE (pulmonary thromboembolism) ([place])     Patient Active Problem List   Diagnosis Date Noted   Menorrhagia 09/27/2020   Left ovarian cyst 08/06/2018   Adnexal mass 07/02/2018   Fibroid 07/02/2018   Lymphedema 04/28/2018   Weakness of both lower extremities 04/28/2018   Sleep disturbance 02/10/2018   Factor V Leiden ([place]) 02/11/2017   Lupus anticoagulant disorder ([place]) 02/11/2017   Shortness of breath 02/11/2017   DJD (degenerative joint disease) 02/11/2017   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 ([place]) 02/10/2017   Post-phlebitic syndrome 02/10/2017   BMI 50.0-59.9, adult ([place]) 12/28/2015   Cramps of right lower extremity 12/28/2015   Dizziness 09/26/2015   History of DVT (deep vein thrombosis) 06/27/2015   Poor balance 06/27/2015    Past Surgical  History:  Procedure Laterality Date   CYST EXCISION  08/20/2019   Scalp   GANGLION CYST EXCISION     IVC FILTER INSERTION     LAPAROSCOPIC OVARIAN CYSTECTOMY Left 09/27/2020   Procedure: LAPAROSCOPIC OVARIAN CYSTECTOMY;  Surgeon: Gae Dry, MD;  Location: [facility] ORS;  Service: Gynecology;  Laterality: Left;   LAPAROSCOPIC UNILATERAL SALPINGECTOMY Left 09/27/2020   Procedure: LAPAROSCOPIC UNILATERAL SALPINGECTOMY;  Surgeon: Gae Dry, MD;  Location: [facility] ORS;  Service: Gynecology;  Laterality: Left;   WISDOM TOOTH EXTRACTION      Prior to Admission medications   Medication Sig Start Date End Date Taking? Authorizing Provider  ELIQUIS 5 MG TABS tablet TAKE 1 TABLET BY MOUTH TWICE PER DAY Patient taking differently: Take 5 mg by mouth 2 (two) times daily. 01/12/19   Schnier, Dolores Lory, MD  oxyCODONE-acetaminophen (PERCOCET/ROXICET) 5-325 MG tablet Take 1 tablet by mouth every 4 (four) hours as needed for moderate pain. Patient not taking: No sig reported 09/27/20   Gae Dry, MD    Allergies Wheat bran, Penicillin v potassium, and Strawberry extract  Family History  Problem Relation Age of Onset   Lupus Mother    Stroke Father    Parkinson's disease Father    Hypertension Father    Gout Father    Cancer Maternal Grandmother     Social History Social History   Tobacco Use   Smoking status:  Never   Smokeless tobacco: Never  Vaping Use   Vaping Use: Never used  Substance Use Topics   Alcohol use: No   Drug use: No    Review of Systems  Constitutional: No fever/chills. Eyes: No redness. ENT: No sore throat. Cardiovascular: Denies chest pain. Respiratory: Denies shortness of breath. Gastrointestinal: No vomiting or diarrhea.  Genitourinary: Negative for dysuria, vaginal bleeding or discharge.  Musculoskeletal: Negative for back pain. Skin: Negative for rash. Neurological: Negative for headaches, focal weakness or  numbness.   ____________________________________________   PHYSICAL EXAM:  VITAL SIGNS: ED Triage Vitals  Enc Vitals Group     BP 04/23/21 1251 140/72     Pulse Rate 04/23/21 1251 90     Resp 04/23/21 1251 17     Temp 04/23/21 1251 98.4 F (36.9 C)     Temp Source 04/23/21 1251 Oral     SpO2 04/23/21 1251 97 %     Weight --      Height --      Head Circumference --      Peak Flow --      Pain Score 04/23/21 1234 8     Pain Loc --      Pain Edu? --      Excl. in [place]? --     Constitutional: Alert and oriented. Well appearing and in no acute distress. Eyes: Conjunctivae are normal.  Head: Atraumatic. Nose: No congestion/rhinnorhea. Mouth/Throat: Mucous membranes are moist.   Neck: Normal range of motion.  Cardiovascular: Normal rate, regular rhythm. Good peripheral circulation. Respiratory: Normal respiratory effort.  No retractions.  Gastrointestinal: Soft and nontender. No distention.  Genitourinary: Right suprapubic palpable mass, approximately 3 cm diameter, moderately tender, with slight erythema to the overlying skin.  No other significant inguinal lymphadenopathy.  No erythema, induration, abnormal warmth, or swelling to the proximal right lower extremity. Musculoskeletal: No lower extremity edema.  Extremities warm and well perfused.  Neurologic:  Normal speech and language. No gross focal neurologic deficits are appreciated.  Skin:  Skin is warm and dry. No rash noted. Psychiatric: Mood and affect are normal. Speech and behavior are normal.  ____________________________________________   LABS (all labs ordered are listed, but only abnormal results are displayed)  Labs Reviewed  BASIC METABOLIC PANEL  CBC WITH DIFFERENTIAL/PLATELET  PREGNANCY, URINE   ____________________________________________  EKG   ____________________________________________  RADIOLOGY  US venous RLE:  IMPRESSION:  Sonographic survey negative for evidence of acute DVT of the  right  lower extremity.     Changes/sequela of prior right lower extremity femoropopliteal DVT     Partially thrombosed venous varicosities/collaterals in the right  inguinal region, compatible with superficial thrombophlebitis.    CT abdomen/pelvis: Pending  ____________________________________________   PROCEDURES  Procedure(s) performed: No  Procedures  Critical Care performed: No ____________________________________________   INITIAL IMPRESSION / ASSESSMENT AND PLAN / ED COURSE  Pertinent labs & imaging results that were available during my care of the patient were reviewed by me and considered in my medical decision making (see chart for details).   40 year old female with PMH as noted above including factor V Leiden, DVT, and PE on Eliquis presents with painful swelling primarily to the right suprapubic area although somewhat extending into the inguinal region and proximal right thigh.  It started when she lifted something heavy.  On exam, her vital signs are normal and she is well-appearing.  There is palpable mass in the right suprapubic region with some slight erythema to the overlying  skin.  This is in the region of the patient's pubic hair.  There are no significant abnormalities to the proximal right leg.  Ultrasound was obtained from triage and shows evidence of possible superficial thrombophlebitis in the right proximal leg, but no evidence of new DVT.  However, based on my exam, differential includes inguinal lymphadenopathy but also possible hernia.  I attempted to differentiate these with a bedside ultrasound although this was inconclusive.  The mass is not reducible.  I will obtain a CT to rule out incarcerated hernia.  ----------------------------------------- 3:36 PM on 04/23/2021 -----------------------------------------  Labs are unremarkable.  CT is pending.  I have signed the patient out to the oncoming ED physician Dr.  Archie Balboa.  ____________________________________________   FINAL CLINICAL IMPRESSION(S) / ED DIAGNOSES  Final diagnoses:  Suprapubic mass      NEW MEDICATIONS STARTED DURING THIS VISIT:  New Prescriptions   No medications on file     Note:  This document was prepared using Dragon voice recognition software and may include unintentional dictation errors.    Arta Silence, MD 04/23/21 1537

## 2021-04-23 NOTE — ED Triage Notes (Signed)
Pt c/o right thigh/groin pain with swelling since Friday, pt states she has a hx of DVT and is currently on eliquis.

## 2021-04-23 NOTE — ED Provider Notes (Signed)
Patient's CT scan without any acute abnormality to explain the patient's symptoms. Discussed this with the patient, also discussed finding of gallstones and kidney stones. Will plan on starting patient on antibiotics for possible cellulitis. Also discussed possibility of superficial thrombophlebitis with patient.    Nance Pear, MD 04/23/21 1739

## 2021-04-23 NOTE — Discharge Instructions (Addendum)
Please seek medical attention for any high fevers, chest pain, shortness of breath, change in behavior, persistent vomiting, bloody stool or any other new or concerning symptoms.  

## 2021-04-24 ENCOUNTER — Telehealth (INDEPENDENT_AMBULATORY_CARE_PROVIDER_SITE_OTHER): Payer: Self-pay | Admitting: Nurse Practitioner

## 2021-04-24 NOTE — Telephone Encounter (Signed)
pt states that she was released from ED and was told to f/u with [place]. After Eulogio Ditch looked at pt's ED records, it was determined that she needs to f/u with PCP as arteries are flowing fine and the groin issues does not seem to be related. I offered appt with Dr. Delana Meyer is she would like but again stated what Eulogio Ditch said. Pt was advised to follow up with PCP and I advised return to ED if symptoms got worse or changed. Pt acknowledged.

## 2021-09-14 DIAGNOSIS — M79604 Pain in right leg: Secondary | ICD-10-CM | POA: Insufficient documentation

## 2021-09-14 DIAGNOSIS — R519 Headache, unspecified: Secondary | ICD-10-CM | POA: Insufficient documentation

## 2021-10-02 ENCOUNTER — Other Ambulatory Visit (INDEPENDENT_AMBULATORY_CARE_PROVIDER_SITE_OTHER): Payer: Self-pay | Admitting: Nurse Practitioner

## 2021-10-02 ENCOUNTER — Telehealth (INDEPENDENT_AMBULATORY_CARE_PROVIDER_SITE_OTHER): Payer: Self-pay | Admitting: Vascular Surgery

## 2021-10-02 ENCOUNTER — Encounter (INDEPENDENT_AMBULATORY_CARE_PROVIDER_SITE_OTHER): Payer: Self-pay | Admitting: Nurse Practitioner

## 2021-10-02 ENCOUNTER — Ambulatory Visit (INDEPENDENT_AMBULATORY_CARE_PROVIDER_SITE_OTHER): Payer: Medicaid Other

## 2021-10-02 ENCOUNTER — Ambulatory Visit (INDEPENDENT_AMBULATORY_CARE_PROVIDER_SITE_OTHER): Payer: Medicaid Other | Admitting: Nurse Practitioner

## 2021-10-02 ENCOUNTER — Other Ambulatory Visit: Payer: Self-pay

## 2021-10-02 VITALS — BP 120/85 | HR 92 | Resp 16 | Wt 195.0 lb

## 2021-10-02 DIAGNOSIS — D6851 Activated protein C resistance: Secondary | ICD-10-CM

## 2021-10-02 DIAGNOSIS — M79604 Pain in right leg: Secondary | ICD-10-CM

## 2021-10-02 NOTE — Telephone Encounter (Signed)
Pt called at 8:26 am asking for an appt today. States tht she has hx of blood clots. States she can't hardly put weight on her right leg. Very swollen leg, pain in calf muscle.  ?

## 2021-10-02 NOTE — Telephone Encounter (Signed)
Pt came in for a r leg DVT study per Eulogio Ditch ?

## 2021-10-03 ENCOUNTER — Encounter (INDEPENDENT_AMBULATORY_CARE_PROVIDER_SITE_OTHER): Payer: Self-pay | Admitting: Nurse Practitioner

## 2021-10-03 NOTE — Progress Notes (Signed)
? ?Subjective:  ? ? Patient ID: Caroline Madden, female    DOB: 1981/03/24, 41 y.o.   MRN: [id number] ?Chief Complaint  ?Patient presents with  ? Follow-up  ?  ultrasound  ? ? ?Caroline Madden is a 41 year old female that presents today for follow-up evaluation after recent fall and subsequent pain and swelling.  She notes that she falls frequently.  The patient does have evidence of chronic thrombus in her right femoral vein however was noted today that there is an acute process in the popliteal space as well as in the tibial veins.  The patient notes that she has a history of factor V Leiden she also has lymphedema.  She is a lymphedema pump daily.  She reports taking her Eliquis without pause however she only takes one 5 mg tablet twice a day.  She denies any fevers or chills. ? ? ?Review of Systems  ?Cardiovascular:  Positive for leg swelling.  ?Musculoskeletal:  Positive for arthralgias.  ?All other systems reviewed and are negative. ? ?   ?Objective:  ? Physical Exam ?Vitals reviewed.  ?HENT:  ?   Head: Normocephalic.  ?Cardiovascular:  ?   Rate and Rhythm: Normal rate.  ?   Pulses: Normal pulses.  ?Pulmonary:  ?   Effort: Pulmonary effort is normal.  ?Musculoskeletal:  ?   Right lower leg: Edema present.  ?Skin: ?   General: Skin is warm and dry.  ?Neurological:  ?   Mental Status: She is alert and oriented to person, place, and time.  ?Psychiatric:     ?   Mood and Affect: Mood normal.     ?   Behavior: Behavior normal.     ?   Thought Content: Thought content normal.     ?   Judgment: Judgment normal.  ? ? ?BP 120/85 (BP Location: Left Arm)   Pulse 92   Resp 16   Wt 195 lb (88.5 kg)   BMI 47.02 kg/m?  ? ?Past Medical History:  ?Diagnosis Date  ? Anxiety   ? NO MEDS  ? Cancer [facility])   ? skin ca  ? DVT (deep venous thrombosis) ([place]) 2002  ? Factor V Leiden ([place])   ? Headache   ? H/O MIGRAINES  ? History of kidney stones   ? Lymphedema   ? right leg-uses lymphedema pump  ? PE (pulmonary thromboembolism) ([place])    ? ? ?Social History  ? ?Socioeconomic History  ? Marital status: Single  ?  Spouse name: Not on file  ? Number of children: Not on file  ? Years of education: Not on file  ? Highest education level: Not on file  ?Occupational History  ? Not on file  ?Tobacco Use  ? Smoking status: Never  ? Smokeless tobacco: Never  ?Vaping Use  ? Vaping Use: Never used  ?Substance and Sexual Activity  ? Alcohol use: No  ? Drug use: No  ? Sexual activity: Yes  ?Other Topics Concern  ? Not on file  ?Social History Narrative  ? Not on file  ? ?Social Determinants of Health  ? ?Financial Resource Strain: Not on file  ?Food Insecurity: Not on file  ?Transportation Needs: Not on file  ?Physical Activity: Not on file  ?Stress: Not on file  ?Social Connections: Not on file  ?Intimate Partner Violence: Not on file  ? ? ?Past Surgical History:  ?Procedure Laterality Date  ? CYST EXCISION  08/20/2019  ? Scalp  ? GANGLION CYST EXCISION    ?  IVC FILTER INSERTION    ? LAPAROSCOPIC OVARIAN CYSTECTOMY Left 09/27/2020  ? Procedure: LAPAROSCOPIC OVARIAN CYSTECTOMY;  Surgeon: Gae Dry, MD;  Location: [facility] ORS;  Service: Gynecology;  Laterality: Left;  ? LAPAROSCOPIC UNILATERAL SALPINGECTOMY Left 09/27/2020  ? Procedure: LAPAROSCOPIC UNILATERAL SALPINGECTOMY;  Surgeon: Gae Dry, MD;  Location: [facility] ORS;  Service: Gynecology;  Laterality: Left;  ? WISDOM TOOTH EXTRACTION    ? ? ?Family History  ?Problem Relation Age of Onset  ? Lupus Mother   ? Stroke Father   ? Parkinson's disease Father   ? Hypertension Father   ? Gout Father   ? Cancer Maternal Grandmother   ? ? ?Allergies  ?Allergen Reactions  ? Wheat Bran Diarrhea  ?  bloating  ? Penicillin V Potassium Rash  ? Strawberry Extract Rash  ? ? ?CBC Latest Ref Rng & Units 04/23/2021 09/23/2020 05/28/2017  ?WBC 4.0 - 10.5 K/uL 9.0 6.7 8.2  ?Hemoglobin 12.0 - 15.0 g/dL 14.5 15.3(H) 15.8  ?Hematocrit 36.0 - 46.0 % 41.7 46.6(H) 47.6(H)  ?Platelets 150 - 400 K/uL 255 268 298  ? ? ? ? ?CMP  ?    ?Component Value Date/Time  ? NA 139 04/23/2021 1449  ? NA 140 02/23/2012 2001  ? K 3.7 04/23/2021 1449  ? K 4.5 02/23/2012 2001  ? CL 107 04/23/2021 1449  ? CL 104 02/23/2012 2001  ? CO2 26 04/23/2021 1449  ? CO2 24 02/23/2012 2001  ? GLUCOSE 87 04/23/2021 1449  ? GLUCOSE 111 (H) 02/23/2012 2001  ? BUN 20 04/23/2021 1449  ? BUN 16 02/23/2012 2001  ? CREATININE 0.59 04/23/2021 1449  ? CREATININE 0.84 02/23/2012 2001  ? CALCIUM 8.9 04/23/2021 1449  ? CALCIUM 9.7 02/23/2012 2001  ? PROT 8.0 02/23/2012 2001  ? ALBUMIN 4.2 02/23/2012 2001  ? AST 38 (H) 02/23/2012 2001  ? ALT 25 02/23/2012 2001  ? ALKPHOS 90 02/23/2012 2001  ? BILITOT 0.4 02/23/2012 2001  ? GFRNONAA >60 04/23/2021 1449  ? GFRNONAA >60 02/23/2012 2001  ? GFRAA >60 05/28/2017 0823  ? GFRAA >60 02/23/2012 2001  ? ? ? ?No results found. ? ?   ?Assessment & Plan:  ? ?1. Right leg pain ?I do not believe that the patient had a failure of Eliquis, because she was not taking a full dose.  She notes that she has previously failed Xarelto so we will not transition her to that.  Patient is advised to take her full dose of Eliquis starting today.  She should take 1 pill in the morning on the evening.  She currently has an IVC filter in place and there is no need for replacement.  She also has a DVT in the popliteal space as well as the tibial veins and this is typically not advisable for intervention.  The patient has a lymphedema pump that she uses regularly.  She is advised that during this time she should utilize her lymphedema pump.  She can place an Ace bandage instead.  Otherwise we will have patient return in approximately 4 to 5 weeks to evaluate for possible propagation ? ?2. Factor V Leiden ([place]) ?Given the patient's history of DVT as well as factor V Leiden, she should be prepared to remain on anticoagulation for life. ? ? ?Current Outpatient Medications on File Prior to Visit  ?Medication Sig Dispense Refill  ? ELIQUIS 5 MG TABS tablet TAKE 1 TABLET BY  MOUTH TWICE PER DAY (Patient taking differently: Take 5 mg by mouth  2 (two) times daily.) 60 tablet 6  ? oxyCODONE-acetaminophen (PERCOCET/ROXICET) 5-325 MG tablet Take 1 tablet by mouth every 4 (four) hours as needed for moderate pain. (Patient not taking: No sig reported) 30 tablet 0  ? ?No current facility-administered medications on file prior to visit.  ? ? ?There are no Patient Instructions on file for this visit. ?No follow-ups on file. ? ? ?Kris Hartmann, NP ? ? ?

## 2021-10-09 ENCOUNTER — Ambulatory Visit: Payer: Medicaid Other | Admitting: Licensed Practical Nurse

## 2021-10-28 ENCOUNTER — Other Ambulatory Visit: Payer: Self-pay | Admitting: Obstetrics & Gynecology

## 2021-10-28 DIAGNOSIS — Z1231 Encounter for screening mammogram for malignant neoplasm of breast: Secondary | ICD-10-CM

## 2021-11-01 ENCOUNTER — Other Ambulatory Visit (INDEPENDENT_AMBULATORY_CARE_PROVIDER_SITE_OTHER): Payer: Self-pay | Admitting: Nurse Practitioner

## 2021-11-01 DIAGNOSIS — D6851 Activated protein C resistance: Secondary | ICD-10-CM

## 2021-11-01 DIAGNOSIS — M79604 Pain in right leg: Secondary | ICD-10-CM

## 2021-11-02 ENCOUNTER — Encounter (INDEPENDENT_AMBULATORY_CARE_PROVIDER_SITE_OTHER): Payer: Self-pay | Admitting: Nurse Practitioner

## 2021-11-02 ENCOUNTER — Ambulatory Visit (INDEPENDENT_AMBULATORY_CARE_PROVIDER_SITE_OTHER): Payer: Medicaid Other | Admitting: Nurse Practitioner

## 2021-11-02 ENCOUNTER — Telehealth (INDEPENDENT_AMBULATORY_CARE_PROVIDER_SITE_OTHER): Payer: Self-pay

## 2021-11-02 ENCOUNTER — Ambulatory Visit (INDEPENDENT_AMBULATORY_CARE_PROVIDER_SITE_OTHER): Payer: Medicaid Other

## 2021-11-02 VITALS — BP 119/89 | HR 81 | Resp 16 | Wt 194.4 lb

## 2021-11-02 DIAGNOSIS — D6851 Activated protein C resistance: Secondary | ICD-10-CM

## 2021-11-02 DIAGNOSIS — M79604 Pain in right leg: Secondary | ICD-10-CM | POA: Diagnosis not present

## 2021-11-02 DIAGNOSIS — I82411 Acute embolism and thrombosis of right femoral vein: Secondary | ICD-10-CM | POA: Diagnosis not present

## 2021-11-02 NOTE — Telephone Encounter (Signed)
Spoke with the patient and she is scheduled with Dr. Delana Meyer for a RLE thrombectomy on 11/07/21 with a 6:45 am arrival time to the MM. Pre-procedure instructions were discussed and patient stated she understood. ?

## 2021-11-07 ENCOUNTER — Other Ambulatory Visit: Payer: Self-pay

## 2021-11-07 ENCOUNTER — Encounter (INDEPENDENT_AMBULATORY_CARE_PROVIDER_SITE_OTHER): Payer: Self-pay | Admitting: Nurse Practitioner

## 2021-11-07 ENCOUNTER — Encounter
Admission: RE | Disposition: A | Discharge status: Home / Self Care | Payer: Self-pay | Source: Home / Self Care | Attending: Vascular Surgery

## 2021-11-07 ENCOUNTER — Encounter: Payer: Self-pay | Admitting: Vascular Surgery

## 2021-11-07 ENCOUNTER — Ambulatory Visit
Admission: RE | Admit: 2021-11-07 | Discharge: 2021-11-07 | Disposition: A | Discharge status: Home / Self Care | Payer: Medicaid Other | Attending: Vascular Surgery | Admitting: Vascular Surgery

## 2021-11-07 DIAGNOSIS — I87091 Postthrombotic syndrome with other complications of right lower extremity: Secondary | ICD-10-CM

## 2021-11-07 DIAGNOSIS — I82501 Chronic embolism and thrombosis of unspecified deep veins of right lower extremity: Secondary | ICD-10-CM | POA: Diagnosis not present

## 2021-11-07 DIAGNOSIS — I82521 Chronic embolism and thrombosis of right iliac vein: Secondary | ICD-10-CM | POA: Diagnosis not present

## 2021-11-07 DIAGNOSIS — I82422 Acute embolism and thrombosis of left iliac vein: Secondary | ICD-10-CM

## 2021-11-07 DIAGNOSIS — T82868A Thrombosis of vascular prosthetic devices, implants and grafts, initial encounter: Secondary | ICD-10-CM

## 2021-11-07 DIAGNOSIS — Z7901 Long term (current) use of anticoagulants: Secondary | ICD-10-CM | POA: Diagnosis not present

## 2021-11-07 DIAGNOSIS — I87001 Postthrombotic syndrome without complications of right lower extremity: Secondary | ICD-10-CM | POA: Insufficient documentation

## 2021-11-07 DIAGNOSIS — D6851 Activated protein C resistance: Secondary | ICD-10-CM | POA: Insufficient documentation

## 2021-11-07 DIAGNOSIS — I890 Lymphedema, not elsewhere classified: Secondary | ICD-10-CM | POA: Diagnosis not present

## 2021-11-07 DIAGNOSIS — I82401 Acute embolism and thrombosis of unspecified deep veins of right lower extremity: Secondary | ICD-10-CM

## 2021-11-07 HISTORY — PX: PERIPHERAL VASCULAR THROMBECTOMY: CATH118306

## 2021-11-07 LAB — CREATININE, SERUM
Creatinine, Ser: 0.66 mg/dL (ref 0.44–1.00)
GFR, Estimated: >60 mL/min (ref >60)

## 2021-11-07 LAB — PREGNANCY, URINE: Preg Test, Ur: NEGATIVE

## 2021-11-07 LAB — BUN: BUN: 18 mg/dL (ref 6–20)

## 2021-11-07 SURGERY — PERIPHERAL VASCULAR THROMBECTOMY
Anesthesia: Moderate Sedation | Site: Leg Lower | Laterality: Right

## 2021-11-07 MED ORDER — MIDAZOLAM HCL 2 MG/2ML IJ SOLN
INTRAMUSCULAR | Status: DC
Start: 2021-11-07 — End: 2021-11-07
  Filled 2021-11-07: qty 2

## 2021-11-07 MED ORDER — MIDAZOLAM HCL 2 MG/ML PO SYRP: 8.0000 mg | ORAL_SOLUTION | Freq: Once | ORAL | Status: DC | PRN

## 2021-11-07 MED ORDER — FENTANYL CITRATE (PF) 100 MCG/2ML IJ SOLN
INTRAMUSCULAR | Status: DC | PRN
  Administered 2021-11-07: 50 ug via INTRAVENOUS

## 2021-11-07 MED ORDER — IODIXANOL 320 MG/ML IV SOLN
INTRAVENOUS | Status: DC | PRN
  Administered 2021-11-07: 20 mL via INTRAVENOUS

## 2021-11-07 MED ORDER — ONDANSETRON HCL 4 MG/2ML IJ SOLN
INTRAMUSCULAR | Status: AC
  Filled 2021-11-07: qty 2

## 2021-11-07 MED ORDER — ONDANSETRON HCL 4 MG/2ML IJ SOLN: 4.0000 mg | Freq: Four times a day (QID) | INTRAMUSCULAR | Status: DC | PRN

## 2021-11-07 MED ORDER — MIDAZOLAM HCL 2 MG/2ML IJ SOLN
INTRAMUSCULAR | Status: DC | PRN
Start: 2021-11-07 — End: 2021-11-07
  Administered 2021-11-07 (×2): 1 mg via INTRAVENOUS
  Administered 2021-11-07: 2 mg via INTRAVENOUS

## 2021-11-07 MED ORDER — HYDROMORPHONE HCL 1 MG/ML IJ SOLN: 1.0000 mg | Freq: Once | INTRAMUSCULAR | Status: DC | PRN

## 2021-11-07 MED ORDER — SODIUM CHLORIDE FLUSH 0.9 % IV SOLN
INTRAVENOUS | Status: DC
Start: 2021-11-07 — End: 2021-11-07
  Filled 2021-11-07: qty 30

## 2021-11-07 MED ORDER — SODIUM CHLORIDE 0.9 % IV SOLN: INTRAVENOUS | Status: DC

## 2021-11-07 MED ORDER — FENTANYL CITRATE PF 50 MCG/ML IJ SOSY
PREFILLED_SYRINGE | INTRAMUSCULAR | Status: AC
  Filled 2021-11-07: qty 1

## 2021-11-07 MED ORDER — HEPARIN SODIUM (PORCINE) 1000 UNIT/ML IJ SOLN
INTRAMUSCULAR | Status: DC | PRN
Start: 2021-11-07 — End: 2021-11-07
  Administered 2021-11-07: 4000 [IU] via INTRAVENOUS

## 2021-11-07 MED ORDER — FAMOTIDINE 20 MG PO TABS: 40.0000 mg | ORAL_TABLET | Freq: Once | ORAL | Status: DC | PRN

## 2021-11-07 MED ORDER — MIDAZOLAM HCL 2 MG/2ML IJ SOLN
INTRAMUSCULAR | Status: AC
  Filled 2021-11-07: qty 2

## 2021-11-07 MED ORDER — DIPHENHYDRAMINE HCL 50 MG/ML IJ SOLN
50.0000 mg | Freq: Once | INTRAMUSCULAR | Status: DC | PRN
Start: 2021-11-07 — End: 2021-11-07

## 2021-11-07 MED ORDER — METHYLPREDNISOLONE SODIUM SUCC 125 MG IJ SOLR: 125.0000 mg | Freq: Once | INTRAMUSCULAR | Status: DC | PRN

## 2021-11-07 MED ORDER — VANCOMYCIN HCL IN DEXTROSE 1-5 GM/200ML-% IV SOLN
1000.0000 mg | INTRAVENOUS | Status: AC
  Administered 2021-11-07: 1000 mg via INTRAVENOUS
  Filled 2021-11-07: qty 200

## 2021-11-07 MED ORDER — HEPARIN SODIUM (PORCINE) 1000 UNIT/ML IJ SOLN
INTRAMUSCULAR | Status: AC
  Filled 2021-11-07: qty 10

## 2021-11-07 MED ORDER — ONDANSETRON HCL 4 MG/2ML IJ SOLN
INTRAMUSCULAR | Status: DC | PRN
Start: 2021-11-07 — End: 2021-11-07
  Administered 2021-11-07: 4 mg via INTRAVENOUS

## 2021-11-07 SURGICAL SUPPLY — 12 items
CANNULA 5F STIFF (CANNULA) ×1 IMPLANT
CATH BEACON 5 .035 65 KMP TIP (CATHETERS) ×1 IMPLANT
COVER PROBE U/S 5X48 (MISCELLANEOUS) ×1 IMPLANT
DEVICE TORQUE (MISCELLANEOUS) ×1 IMPLANT
GAUZE SPONGE 4X4 12PLY STRL (GAUZE/BANDAGES/DRESSINGS) ×2 IMPLANT
GLIDECATH 4FR STR (CATHETERS) ×1 IMPLANT
GLIDEWIRE ADV .035X180CM (WIRE) ×1 IMPLANT
GLIDEWIRE STIFF .35X180X3 HYDR (WIRE) ×1 IMPLANT
PACK ANGIOGRAPHY (CUSTOM PROCEDURE TRAY) ×2 IMPLANT
SHEATH BRITE TIP 6FRX11 (SHEATH) ×1 IMPLANT
WIRE G 018X200 V18 (WIRE) ×1 IMPLANT
WIRE GUIDERIGHT .035X150 (WIRE) ×1 IMPLANT

## 2021-11-07 NOTE — Op Note (Signed)
[facility] ? ? ?OPERATIVE NOTE ? ?DATE: 11/07/2021 ? ?PRE-OPERATIVE DIAGNOSIS:  ?1.  Postphlebitic syndrome right lower extremity ?2.  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  History of iliac stent placement right lower extremity ? ?POST-OPERATIVE DIAGNOSIS: same ? ?PROCEDURE: ?1.   Ultrasound guidance for vascular access to the right popliteal vein ?2.   Catheter placement into right iliac vein ?3.   Right lower extremity venogram and inferior venacavogram ? ? ?SURGEON: Hortencia Pilar, MD ? ?ASSISTANT(S): None ? ?ANESTHESIA: Local with moderate conscious sedation for approximately 44 minutes using 4 mg of Versed and 50 mcg of Fentanyl.  Continuous ECG pulse oximetry and cardiopulmonary monitoring was performed throughout the entire procedure by the interventional radiology nurse. ? ?ESTIMATED BLOOD LOSS: Less than 5 cc ? ?FINDING(S): ?1.  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  The previously placed iliac stent is occluded.  The inferior vena cava appears to be patent. ? ?SPECIMEN(S):  None ? ?INDICATIONS:   ?Patient is a 41 y.o.female who presents with increasing symptoms of the right lower extremity.  Noninvasive study suggest there is acute on chronic thrombus.  Venography with the possibility of venous intervention was discussed.  Risks and benefits were discussed and informed consent was obtained.  ? ?DESCRIPTION: ?After obtaining full informed written consent, the patient was brought back to the vascular suite and placed in prone position and the right popliteal fossa was prepped and draped in sterile fashion. Moderate conscious sedation was administered during a face to face encounter with the patient throughout the procedure with my supervision of the RN administering medicines and monitoring the patient's vital signs, pulse oximetry, telemetry and mental status throughout from the start of the procedure  until the patient was taken to the recovery room.  After obtaining adequate anesthesia, the patient was prepped and draped in the standard fashion.  ? ?The right popliteal vein was identified with ultrasound and found to be partially patent. It was then accessed under direct ultrasound guidance without difficulty with a micropuncture needle and a permanent image was recorded. A micropuncture wire and sheath were then placed. Imaging initially was performed through the micropuncture sheath which showed numerous collaterals with occlusion of the superficial femoral vein.  I was able to advance a stiff angled Glidewire partially into the superficial femoral vein and this allowed to purchase for placement of a 6 French sheath.  I then used a combination of a KMP catheter and a straight glide catheter with the stiff angle Glidewire as well as an advantage wire was able to negotiate the straight glide catheter into the distal right external iliac vein.  Imaging was performed at this level as well as the mid superficial femoral vein level.  I was not able to cross the occluded iliac stent. ? ?At this point, I elected to terminate the procedure.  The right popliteal sheath was removed and pressure was held the patient was awakened from anesthesia and taken to the recovery room in stable condition. ? ?INTERPRETATION: ?Initial imaging through the micro sheath demonstrated the popliteal vein was atretic and appeared very scarred and discontinuous.  There was a confluence of tributaries that appeared to reconstitute the distal superficial femoral vein for short segment then but then this occluded more proximally.  Once I was able to negotiate the wire and catheter into this hand-injection contrast again demonstrated significant scarring with segments that appeared discontinuous.  There  is very poor imaging of the common femoral vein however there are at least 4 extremely large collaterals at this level that appear to fill  pelvic veins and cross from right to left.  Previously placed stent is noted and upon further evaluation at this level it appears to be occluded.  There does seem to be some contrast either coursing around the lateral margin of the stent or perhaps through a collateral that then fills the right common iliac near its confluence and then fills the inferior vena cava which appears widely patent. ? ?COMPLICATIONS: None ? ?CONDITION: Stable  ?

## 2021-11-07 NOTE — Progress Notes (Signed)
Dr. Delana Meyer at bedside, speaking with pt. And her S.O. Thornell Sartorius re: procedural results. Both verbalize understanding of conversation. F/U appt. Made for pt. In 2 weeks. Pt. Stable for DC home.  ?

## 2021-11-07 NOTE — Progress Notes (Signed)
? ?Subjective:  ? ? Patient ID: Caroline Madden, female    DOB: 1981-04-22, 41 y.o.   MRN: [id number] ?Chief Complaint  ?Patient presents with  ? Follow-up  ?  Ultrasound follow up  ? ? ?Caroline Madden is a 41 year old female that presents today for follow-up evaluation after recent fall and subsequent pain and swelling.  She notes that she falls frequently.  The patient does have evidence of chronic thrombus in her right femoral vein however was noted at the previous visit that there is an acute process in the popliteal space as well as in the tibial veins.  The patient notes that she has a history of factor V Leiden she also has lymphedema.  She uses a lymphedema pump daily.  Initially she reported taking her Eliquis without pause however she only took one 5 mg tablet once a day, since her most recent visit the patient has been compliant with taking Eliquis twice a day.  She denies any fevers or chills.  She notes that she is following last surgery most recent office visit.  She notes that the swelling seems to have gotten worse in the right lower extremity.  Today it was noted that the patient has an acute process in the right femoral vein, in addition to the previous acute process in the popliteal and tibial veins.  These findings suggest new clot progression. ?  ? ? ?Review of Systems  ?Cardiovascular:  Positive for leg swelling.  ?All other systems reviewed and are negative. ? ?   ?Objective:  ? Physical Exam ?Vitals reviewed.  ?HENT:  ?   Head: Normocephalic.  ?Cardiovascular:  ?   Rate and Rhythm: Normal rate.  ?   Pulses: Normal pulses.  ?Pulmonary:  ?   Effort: Pulmonary effort is normal.  ?Musculoskeletal:  ?   Right lower leg: 2+ Edema present.  ?Skin: ?   General: Skin is warm and dry.  ?Neurological:  ?   Mental Status: She is alert and oriented to person, place, and time.  ?Psychiatric:     ?   Mood and Affect: Mood normal.     ?   Behavior: Behavior normal.     ?   Thought Content: Thought content normal.      ?   Judgment: Judgment normal.  ? ? ?BP 119/89 (BP Location: Right Arm)   Pulse 81   Resp 16   Wt 194 lb 6.4 oz (88.2 kg)   BMI 46.87 kg/m?  ? ?Past Medical History:  ?Diagnosis Date  ? Anxiety   ? NO MEDS  ? Cancer [facility])   ? skin ca  ? DVT (deep venous thrombosis) ([place]) 2002  ? Factor V Leiden ([place])   ? Headache   ? H/O MIGRAINES  ? History of kidney stones   ? Lymphedema   ? right leg-uses lymphedema pump  ? PE (pulmonary thromboembolism) ([employer])   ? ? ?Social History  ? ?Socioeconomic History  ? Marital status: Single  ?  Spouse name: Not on file  ? Number of children: Not on file  ? Years of education: Not on file  ? Highest education level: Not on file  ?Occupational History  ? Not on file  ?Tobacco Use  ? Smoking status: Never  ? Smokeless tobacco: Never  ?Vaping Use  ? Vaping Use: Never used  ?Substance and Sexual Activity  ? Alcohol use: No  ? Drug use: No  ? Sexual activity: Yes  ?Other Topics Concern  ?  Not on file  ?Social History Narrative  ? Not on file  ? ?Social Determinants of Health  ? ?Financial Resource Strain: Not on file  ?Food Insecurity: Not on file  ?Transportation Needs: Not on file  ?Physical Activity: Not on file  ?Stress: Not on file  ?Social Connections: Not on file  ?Intimate Partner Violence: Not on file  ? ? ?Past Surgical History:  ?Procedure Laterality Date  ? CYST EXCISION  08/20/2019  ? Scalp  ? GANGLION CYST EXCISION    ? IVC FILTER INSERTION    ? LAPAROSCOPIC OVARIAN CYSTECTOMY Left 09/27/2020  ? Procedure: LAPAROSCOPIC OVARIAN CYSTECTOMY;  Surgeon: Gae Dry, MD;  Location: [facility] ORS;  Service: Gynecology;  Laterality: Left;  ? LAPAROSCOPIC UNILATERAL SALPINGECTOMY Left 09/27/2020  ? Procedure: LAPAROSCOPIC UNILATERAL SALPINGECTOMY;  Surgeon: Gae Dry, MD;  Location: [facility] ORS;  Service: Gynecology;  Laterality: Left;  ? WISDOM TOOTH EXTRACTION    ? ? ?Family History  ?Problem Relation Age of Onset  ? Lupus Mother   ? Stroke Father   ? Parkinson's disease  Father   ? Hypertension Father   ? Gout Father   ? Cancer Maternal Grandmother   ? ? ?Allergies  ?Allergen Reactions  ? Wheat Bran Diarrhea  ?  bloating  ? Penicillin V Potassium Rash  ? Strawberry Extract Rash  ? ? ? ?  Latest Ref Rng & Units 04/23/2021  ?  2:49 PM 09/23/2020  ? 11:27 AM 05/28/2017  ?  8:23 AM  ?CBC  ?WBC 4.0 - 10.5 K/uL 9.0   6.7   8.2    ?Hemoglobin 12.0 - 15.0 g/dL 14.5   15.3   15.8    ?Hematocrit 36.0 - 46.0 % 41.7   46.6   47.6    ?Platelets 150 - 400 K/uL 255   268   298    ? ? ? ? ?CMP  ?   ?Component Value Date/Time  ? NA 139 04/23/2021 1449  ? NA 140 02/23/2012 2001  ? K 3.7 04/23/2021 1449  ? K 4.5 02/23/2012 2001  ? CL 107 04/23/2021 1449  ? CL 104 02/23/2012 2001  ? CO2 26 04/23/2021 1449  ? CO2 24 02/23/2012 2001  ? GLUCOSE 87 04/23/2021 1449  ? GLUCOSE 111 (H) 02/23/2012 2001  ? BUN 20 04/23/2021 1449  ? BUN 16 02/23/2012 2001  ? CREATININE 0.59 04/23/2021 1449  ? CREATININE 0.84 02/23/2012 2001  ? CALCIUM 8.9 04/23/2021 1449  ? CALCIUM 9.7 02/23/2012 2001  ? PROT 8.0 02/23/2012 2001  ? ALBUMIN 4.2 02/23/2012 2001  ? AST 38 (H) 02/23/2012 2001  ? ALT 25 02/23/2012 2001  ? ALKPHOS 90 02/23/2012 2001  ? BILITOT 0.4 02/23/2012 2001  ? GFRNONAA >60 04/23/2021 1449  ? GFRNONAA >60 02/23/2012 2001  ? GFRAA >60 05/28/2017 0823  ? GFRAA >60 02/23/2012 2001  ? ? ? ?No results found. ? ?   ?Assessment & Plan:  ? ?1. Acute deep vein thrombosis (DVT) of femoral vein of right lower extremity (HCC) ?I had a long discussion with the patient in regards to the formation of DVT as well as anticoagulation.  They are unsure exactly when this clot progressed but it has been within the realm of months.  Consistently swelling is painful and uncomfortable for the patient, we will have her undergo a thrombectomy to attempt to decrease the clot burden.  I discussed with patient that it is possible that based on how long this clot has  been present, we may be unable to remove a great deal but given her pain and  discomfort it is certainly reasonable to try.  We discussed the risk, benefits and alternatives.  Following this discussion the patient is agreeable to proceed. ? ?Additionally, there is a concern that since this clot has propagated since her last office visit that she may have a failure of anticoagulation, since the patient notably transition to full dose anticoagulation.  The patient is also taking Xarelto but notably felt that as well.  Due to this we will refer the patient to hematology for work-up as well as recommendations in the setting of anticoagulation failure. ?- Ambulatory referral to Hematology / Oncology ? ?2. Factor V Leiden ([place]) ?The patient also has a noted lupus anticoagulation disorder.  Based on these anticoagulation disorders and the concern for anticoagulation failure we referred the patient to hematology. ? ? ?Current Outpatient Medications on File Prior to Visit  ?Medication Sig Dispense Refill  ? buPROPion (WELLBUTRIN XL) 150 MG 24 hr tablet Take 150 mg by mouth daily.    ? ELIQUIS 5 MG TABS tablet TAKE 1 TABLET BY MOUTH TWICE PER DAY 60 tablet 6  ? fenofibrate (TRICOR) 48 MG tablet Take 48 mg by mouth daily.    ? ?No current facility-administered medications on file prior to visit.  ? ? ?There are no Patient Instructions on file for this visit. ?No follow-ups on file. ? ? ?Caroline Hartmann, NP ? ? ?

## 2021-11-07 NOTE — Interval H&P Note (Signed)
History and Physical Interval Note: ? ?11/07/2021 ?8:39 AM ? ?Caroline Madden  has presented today for surgery, with the diagnosis of RLE Thrombectomy    DVT.  The various methods of treatment have been discussed with the patient and family. After consideration of risks, benefits and other options for treatment, the patient has consented to  Procedure(s): ?PERIPHERAL VASCULAR THROMBECTOMY (Right) as a surgical intervention.  The patient's history has been reviewed, patient examined, no change in status, stable for surgery.  I have reviewed the patient's chart and labs.  Questions were answered to the patient's satisfaction.   ? ? ?Caroline Madden ? ? ?

## 2021-11-07 NOTE — H&P (View-Only) (Signed)
? ?Subjective:  ? ? Patient ID: Caroline Madden, female    DOB: 07/28/1980, 41 y.o.   MRN: [id number] ?Chief Complaint  ?Patient presents with  ? Follow-up  ?  Ultrasound follow up  ? ? ?Caroline Madden is a 41 year old female that presents today for follow-up evaluation after recent fall and subsequent pain and swelling.  She notes that she falls frequently.  The patient does have evidence of chronic thrombus in her right femoral vein however was noted at the previous visit that there is an acute process in the popliteal space as well as in the tibial veins.  The patient notes that she has a history of factor V Leiden she also has lymphedema.  She uses a lymphedema pump daily.  Initially she reported taking her Eliquis without pause however she only took one 5 mg tablet once a day, since her most recent visit the patient has been compliant with taking Eliquis twice a day.  She denies any fevers or chills.  She notes that she is following last surgery most recent office visit.  She notes that the swelling seems to have gotten worse in the right lower extremity.  Today it was noted that the patient has an acute process in the right femoral vein, in addition to the previous acute process in the popliteal and tibial veins.  These findings suggest new clot progression. ?  ? ? ?Review of Systems  ?Cardiovascular:  Positive for leg swelling.  ?All other systems reviewed and are negative. ? ?   ?Objective:  ? Physical Exam ?Vitals reviewed.  ?HENT:  ?   Head: Normocephalic.  ?Cardiovascular:  ?   Rate and Rhythm: Normal rate.  ?   Pulses: Normal pulses.  ?Pulmonary:  ?   Effort: Pulmonary effort is normal.  ?Musculoskeletal:  ?   Right lower leg: 2+ Edema present.  ?Skin: ?   General: Skin is warm and dry.  ?Neurological:  ?   Mental Status: She is alert and oriented to person, place, and time.  ?Psychiatric:     ?   Mood and Affect: Mood normal.     ?   Behavior: Behavior normal.     ?   Thought Content: Thought content normal.      ?   Judgment: Judgment normal.  ? ? ?BP 119/89 (BP Location: Right Arm)   Pulse 81   Resp 16   Wt 194 lb 6.4 oz (88.2 kg)   BMI 46.87 kg/m?  ? ?Past Medical History:  ?Diagnosis Date  ? Anxiety   ? NO MEDS  ? Cancer [facility])   ? skin ca  ? DVT (deep venous thrombosis) ([place]) 2002  ? Factor V Leiden ([place])   ? Headache   ? H/O MIGRAINES  ? History of kidney stones   ? Lymphedema   ? right leg-uses lymphedema pump  ? PE (pulmonary thromboembolism) ([place])   ? ? ?Social History  ? ?Socioeconomic History  ? Marital status: Single  ?  Spouse name: Not on file  ? Number of children: Not on file  ? Years of education: Not on file  ? Highest education level: Not on file  ?Occupational History  ? Not on file  ?Tobacco Use  ? Smoking status: Never  ? Smokeless tobacco: Never  ?Vaping Use  ? Vaping Use: Never used  ?Substance and Sexual Activity  ? Alcohol use: No  ? Drug use: No  ? Sexual activity: Yes  ?Other Topics Concern  ?  Not on file  ?Social History Narrative  ? Not on file  ? ?Social Determinants of Health  ? ?Financial Resource Strain: Not on file  ?Food Insecurity: Not on file  ?Transportation Needs: Not on file  ?Physical Activity: Not on file  ?Stress: Not on file  ?Social Connections: Not on file  ?Intimate Partner Violence: Not on file  ? ? ?Past Surgical History:  ?Procedure Laterality Date  ? CYST EXCISION  08/20/2019  ? Scalp  ? GANGLION CYST EXCISION    ? IVC FILTER INSERTION    ? LAPAROSCOPIC OVARIAN CYSTECTOMY Left 09/27/2020  ? Procedure: LAPAROSCOPIC OVARIAN CYSTECTOMY;  Surgeon: Gae Dry, MD;  Location: [facility] ORS;  Service: Gynecology;  Laterality: Left;  ? LAPAROSCOPIC UNILATERAL SALPINGECTOMY Left 09/27/2020  ? Procedure: LAPAROSCOPIC UNILATERAL SALPINGECTOMY;  Surgeon: Gae Dry, MD;  Location: [facility] ORS;  Service: Gynecology;  Laterality: Left;  ? WISDOM TOOTH EXTRACTION    ? ? ?Family History  ?Problem Relation Age of Onset  ? Lupus Mother   ? Stroke Father   ? Parkinson's disease  Father   ? Hypertension Father   ? Gout Father   ? Cancer Maternal Grandmother   ? ? ?Allergies  ?Allergen Reactions  ? Wheat Bran Diarrhea  ?  bloating  ? Penicillin V Potassium Rash  ? Strawberry Extract Rash  ? ? ? ?  Latest Ref Rng & Units 04/23/2021  ?  2:49 PM 09/23/2020  ? 11:27 AM 05/28/2017  ?  8:23 AM  ?CBC  ?WBC 4.0 - 10.5 K/uL 9.0   6.7   8.2    ?Hemoglobin 12.0 - 15.0 g/dL 14.5   15.3   15.8    ?Hematocrit 36.0 - 46.0 % 41.7   46.6   47.6    ?Platelets 150 - 400 K/uL 255   268   298    ? ? ? ? ?CMP  ?   ?Component Value Date/Time  ? NA 139 04/23/2021 1449  ? NA 140 02/23/2012 2001  ? K 3.7 04/23/2021 1449  ? K 4.5 02/23/2012 2001  ? CL 107 04/23/2021 1449  ? CL 104 02/23/2012 2001  ? CO2 26 04/23/2021 1449  ? CO2 24 02/23/2012 2001  ? GLUCOSE 87 04/23/2021 1449  ? GLUCOSE 111 (H) 02/23/2012 2001  ? BUN 20 04/23/2021 1449  ? BUN 16 02/23/2012 2001  ? CREATININE 0.59 04/23/2021 1449  ? CREATININE 0.84 02/23/2012 2001  ? CALCIUM 8.9 04/23/2021 1449  ? CALCIUM 9.7 02/23/2012 2001  ? PROT 8.0 02/23/2012 2001  ? ALBUMIN 4.2 02/23/2012 2001  ? AST 38 (H) 02/23/2012 2001  ? ALT 25 02/23/2012 2001  ? ALKPHOS 90 02/23/2012 2001  ? BILITOT 0.4 02/23/2012 2001  ? GFRNONAA >60 04/23/2021 1449  ? GFRNONAA >60 02/23/2012 2001  ? GFRAA >60 05/28/2017 0823  ? GFRAA >60 02/23/2012 2001  ? ? ? ?No results found. ? ?   ?Assessment & Plan:  ? ?1. Acute deep vein thrombosis (DVT) of femoral vein of right lower extremity (HCC) ?I had a long discussion with the patient in regards to the formation of DVT as well as anticoagulation.  They are unsure exactly when this clot progressed but it has been within the realm of months.  Consistently swelling is painful and uncomfortable for the patient, we will have her undergo a thrombectomy to attempt to decrease the clot burden.  I discussed with patient that it is possible that based on how long this clot has  been present, we may be unable to remove a great deal but given her pain and  discomfort it is certainly reasonable to try.  We discussed the risk, benefits and alternatives.  Following this discussion the patient is agreeable to proceed. ? ?Additionally, there is a concern that since this clot has propagated since her last office visit that she may have a failure of anticoagulation, since the patient notably transition to full dose anticoagulation.  The patient is also taking Xarelto but notably felt that as well.  Due to this we will refer the patient to hematology for work-up as well as recommendations in the setting of anticoagulation failure. ?- Ambulatory referral to Hematology / Oncology ? ?2. Factor V Leiden ([place]) ?The patient also has a noted lupus anticoagulation disorder.  Based on these anticoagulation disorders and the concern for anticoagulation failure we referred the patient to hematology. ? ? ?Current Outpatient Medications on File Prior to Visit  ?Medication Sig Dispense Refill  ? buPROPion (WELLBUTRIN XL) 150 MG 24 hr tablet Take 150 mg by mouth daily.    ? ELIQUIS 5 MG TABS tablet TAKE 1 TABLET BY MOUTH TWICE PER DAY 60 tablet 6  ? fenofibrate (TRICOR) 48 MG tablet Take 48 mg by mouth daily.    ? ?No current facility-administered medications on file prior to visit.  ? ? ?There are no Patient Instructions on file for this visit. ?No follow-ups on file. ? ? ?Kris Hartmann, NP ? ? ?

## 2021-11-07 NOTE — OR Nursing (Signed)
Pt reports intolerance of most pain medications. Causes nausea. Zofran iv given and will try to do procedure with versed only but agreeable to get fentanyl later if needed. Per Dr Delana Meyer.  ?

## 2021-11-10 ENCOUNTER — Telehealth (INDEPENDENT_AMBULATORY_CARE_PROVIDER_SITE_OTHER): Payer: Self-pay

## 2021-11-10 NOTE — Telephone Encounter (Signed)
Some people have an allergic reaction to the tape.  I would recommend some hydrocortisone cream around the area

## 2021-11-10 NOTE — Telephone Encounter (Signed)
Patient was made aware with medical recommendations and verbalized understanding 

## 2021-11-14 ENCOUNTER — Telehealth (INDEPENDENT_AMBULATORY_CARE_PROVIDER_SITE_OTHER): Payer: Self-pay

## 2021-11-14 NOTE — Telephone Encounter (Signed)
Pt called and left VM stating that she has a rash and it is spreading.  I called and spoke with pt she said she had a reaction from surgery now on both legs. The hydrocortisone cream is helping with the itch, but it is still bothersome and spreading. Pt states it is really red on both legs now.  Pt states she does not know what to do since she reacts to medications differently and she is on blood thinners. What are your recommendations at this time? ?

## 2021-11-14 NOTE — Telephone Encounter (Signed)
It sounds like she is having an allergic reaction.  She should take a '25mg'$  benadryl, every 4 to 6 hours and a 10 mg claritin twice a day for the next 3 days and let us know how it's going.  If it works that may be enough, but if not we can do some steroids

## 2021-11-14 NOTE — Telephone Encounter (Signed)
Spoke with pt and she states verbal understanding ?

## 2021-11-15 ENCOUNTER — Inpatient Hospital Stay: Payer: Medicaid Other

## 2021-11-15 ENCOUNTER — Encounter: Payer: Self-pay | Admitting: Internal Medicine

## 2021-11-15 ENCOUNTER — Inpatient Hospital Stay: Payer: Medicaid Other | Attending: Internal Medicine | Admitting: Internal Medicine

## 2021-11-15 VITALS — BP 123/106 | HR 92 | Temp 98.3°F | Ht <= 58 in | Wt 198.4 lb

## 2021-11-15 DIAGNOSIS — Z79899 Other long term (current) drug therapy: Secondary | ICD-10-CM | POA: Insufficient documentation

## 2021-11-15 DIAGNOSIS — Z818 Family history of other mental and behavioral disorders: Secondary | ICD-10-CM | POA: Insufficient documentation

## 2021-11-15 DIAGNOSIS — Z800 Family history of malignant neoplasm of digestive organs: Secondary | ICD-10-CM | POA: Diagnosis not present

## 2021-11-15 DIAGNOSIS — Z86711 Personal history of pulmonary embolism: Secondary | ICD-10-CM | POA: Diagnosis not present

## 2021-11-15 DIAGNOSIS — Z8249 Family history of ischemic heart disease and other diseases of the circulatory system: Secondary | ICD-10-CM | POA: Insufficient documentation

## 2021-11-15 DIAGNOSIS — Z823 Family history of stroke: Secondary | ICD-10-CM | POA: Insufficient documentation

## 2021-11-15 DIAGNOSIS — Z9079 Acquired absence of other genital organ(s): Secondary | ICD-10-CM | POA: Diagnosis not present

## 2021-11-15 DIAGNOSIS — R21 Rash and other nonspecific skin eruption: Secondary | ICD-10-CM | POA: Insufficient documentation

## 2021-11-15 DIAGNOSIS — D6851 Activated protein C resistance: Secondary | ICD-10-CM | POA: Diagnosis not present

## 2021-11-15 DIAGNOSIS — I82521 Chronic embolism and thrombosis of right iliac vein: Secondary | ICD-10-CM | POA: Diagnosis not present

## 2021-11-15 DIAGNOSIS — Z8349 Family history of other endocrine, nutritional and metabolic diseases: Secondary | ICD-10-CM | POA: Diagnosis not present

## 2021-11-15 DIAGNOSIS — R5383 Other fatigue: Secondary | ICD-10-CM | POA: Insufficient documentation

## 2021-11-15 DIAGNOSIS — Z87442 Personal history of urinary calculi: Secondary | ICD-10-CM | POA: Insufficient documentation

## 2021-11-15 DIAGNOSIS — M2550 Pain in unspecified joint: Secondary | ICD-10-CM | POA: Insufficient documentation

## 2021-11-15 DIAGNOSIS — Z9049 Acquired absence of other specified parts of digestive tract: Secondary | ICD-10-CM

## 2021-11-15 DIAGNOSIS — M7989 Other specified soft tissue disorders: Secondary | ICD-10-CM | POA: Diagnosis not present

## 2021-11-15 DIAGNOSIS — R42 Dizziness and giddiness: Secondary | ICD-10-CM | POA: Diagnosis not present

## 2021-11-15 DIAGNOSIS — Z7901 Long term (current) use of anticoagulants: Secondary | ICD-10-CM | POA: Diagnosis not present

## 2021-11-15 NOTE — Patient Instructions (Signed)
Odis Hollingshead, MD   ?[address]   ?[place], [place] [address]   ?Phone: [phone]   ?Fax: [phone]   ?

## 2021-11-15 NOTE — Progress Notes (Signed)
[facility] ?CONSULT NOTE ? ?Patient Care Team: ?Tracie Harrier, MD as PCP - General (Internal Medicine) ? ?CHIEF COMPLAINTS/PURPOSE OF CONSULTATION:  ? 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 PE- 2005/pregnancy]; factor V leiden heterozygous; Lupus anticoagulant positivity; Obesity [last Seen Dr. Molinda Bailiff; May 2014] s/p Stenting Right iliac/ s/p IVC filter- on xarelto; JUne 2017- Fem/pop V DV- chronic/acute ; July 2017- Started on Eliquis [Dr.Schneir]; ? switched to [place]; ?  Switched on Eliquis- LE DOPPLERS- NOV 2022- Sonographic survey negative for evidence of acute DVT of the right lower extremity. Changes/sequela of prior right lower extremity femoropopliteal DVT; Partially thrombosed venous varicosities/collaterals in the right  inguinal region, compatible with superficial thrombophlebitis. ? ?# Right iliac stent thrombosis [at [facility] 2014]/ chronic Right LE thormbophlebitis ?  ? ?HISTORY OF PRESENTING ILLNESS:  ?Caroline Madden 41 y.o.  female with a history of multiple DVT of the right lower extremity [risk factors listed above]- currently on Eliquis; and right leg postphlebitic syndrome is referred to [place] again for " failure of anticoagulation". ? ?Patient has a long complicated history of recurrent right lower extremity DVT; prior history of PE.  Patient has been on multiple anticoagulations in the past.  Most recently she is on Eliquis. ? ?Most recently she had an ultrasound of the right lower extremity in OCT 2022 due to chronic DVT.  Given the ongoing symptoms/patient underwent venography with Dr. Delana Meyer in April 2023.  Patient noted to have extensive chronic scarring of the popliteal and superficial femoral vein as well as the common femoral vein.  The previously placed iliac stent is occluded.  The inferior vena cava appears to be patent.  No interventions were possible. ? ?Patient referred to [place] for further evaluation recommendations.  ? ?Patient  states to be compliant with her anticoagulation.  However she complains of falls.  She does complain of falls/ because of "weakness of the right lower extremity".  States that she feels intermittently dizzy.  No recent head trauma. ? ?Noted to have maculopapular rash on the right lower extremity-allergy to surgical team/medications.  ?  ? ?Review of Systems  ?Constitutional:  Positive for malaise/fatigue. Negative for chills, diaphoresis, fever and weight loss.  ?HENT:  Negative for nosebleeds and sore throat.   ?Eyes:  Negative for double vision.  ?Respiratory:  Negative for cough, hemoptysis, sputum production, shortness of breath and wheezing.   ?Cardiovascular:  Positive for leg swelling. Negative for chest pain, palpitations and orthopnea.  ?Gastrointestinal:  Negative for abdominal pain, blood in stool, constipation, diarrhea, heartburn, melena, nausea and vomiting.  ?Genitourinary:  Negative for dysuria, frequency and urgency.  ?Musculoskeletal:  Positive for joint pain. Negative for back pain.  ?Skin: Negative.  Negative for itching and rash.  ?Neurological:  Positive for dizziness. Negative for tingling, focal weakness, weakness and headaches.  ?Endo/Heme/Allergies:  Does not bruise/bleed easily.  ?Psychiatric/Behavioral:  Negative for depression. The patient is not nervous/anxious and does not have insomnia.   ? ? ?MEDICAL HISTORY:  ?Past Medical History:  ?Diagnosis Date  ? Anxiety   ? NO MEDS  ? Cancer [facility])   ? skin ca  ? DVT (deep venous thrombosis) ([place]) 2002  ? Factor V Leiden ([place])   ? Headache   ? H/O MIGRAINES  ? History of kidney stones   ? Lymphedema   ? right leg-uses lymphedema pump  ? PE (pulmonary thromboembolism) ([place])   ? ? ?SURGICAL HISTORY: ?Past Surgical History:  ?  Procedure Laterality Date  ? CYST EXCISION  08/20/2019  ? Scalp  ? GANGLION CYST EXCISION    ? IR FALLOPIAN TUBE CATHETERIZATION N/A   ? removal per pt. of 1 fallopian tube  ? IVC FILTER INSERTION    ? LAPAROSCOPIC OVARIAN  CYSTECTOMY Left 09/27/2020  ? Procedure: LAPAROSCOPIC OVARIAN CYSTECTOMY;  Surgeon: Gae Dry, MD;  Location: [facility] ORS;  Service: Gynecology;  Laterality: Left;  ? LAPAROSCOPIC UNILATERAL SALPINGECTOMY Left 09/27/2020  ? Procedure: LAPAROSCOPIC UNILATERAL SALPINGECTOMY;  Surgeon: Gae Dry, MD;  Location: [facility] ORS;  Service: Gynecology;  Laterality: Left;  ? PERIPHERAL VASCULAR THROMBECTOMY Right 11/07/2021  ? Procedure: PERIPHERAL VASCULAR THROMBECTOMY;  Surgeon: Katha Cabal, MD;  Location: [facility];  Service: Cardiovascular;  Laterality: Right;  ? WISDOM TOOTH EXTRACTION    ? ? ?SOCIAL HISTORY: ?Social History  ? ?Socioeconomic History  ? Marital status: Single  ?  Spouse name: Not on file  ? Number of children: Not on file  ? Years of education: Not on file  ? Highest education level: Not on file  ?Occupational History  ? Not on file  ?Tobacco Use  ? Smoking status: Never  ? Smokeless tobacco: Never  ?Vaping Use  ? Vaping Use: Never used  ?Substance and Sexual Activity  ? Alcohol use: No  ? Drug use: No  ? Sexual activity: Yes  ?Other Topics Concern  ? Not on file  ?Social History Narrative  ? Has a 55 y.o. daughter & a S.O. Thornell Sartorius   ? ?Social Determinants of Health  ? ?Financial Resource Strain: Not on file  ?Food Insecurity: Not on file  ?Transportation Needs: Not on file  ?Physical Activity: Not on file  ?Stress: Not on file  ?Social Connections: Not on file  ?Intimate Partner Violence: Not on file  ? ? ?FAMILY HISTORY: ?Family History  ?Problem Relation Age of Onset  ? Lupus Mother   ? Stroke Father   ? Parkinson's disease Father   ? Hypertension Father   ? Gout Father   ? Colon cancer Maternal Grandmother   ? Stomach cancer Maternal Grandmother   ? ? ?ALLERGIES:  is allergic to wheat bran, penicillin v potassium, and strawberry extract. ? ?MEDICATIONS:  ?Current Outpatient Medications  ?Medication Sig Dispense Refill  ? buPROPion (WELLBUTRIN XL) 150 MG 24 hr tablet Take  150 mg by mouth daily.    ? ELIQUIS 5 MG TABS tablet TAKE 1 TABLET BY MOUTH TWICE PER DAY 60 tablet 6  ? fenofibrate (TRICOR) 48 MG tablet Take 48 mg by mouth daily.    ? ?No current facility-administered medications for this visit.  ? ? ?  ?. ? ?PHYSICAL EXAMINATION: ? ? ?Vitals:  ? 11/15/21 1124  ?BP: (!) 123/106  ?Pulse: 92  ?Temp: 98.3 ?F (36.8 ?C)  ?SpO2: 98%  ? ? ?Filed Weights  ? 11/15/21 1124  ?Weight: 198 lb 6.4 oz (90 kg)  ? ?Maculopapular rash on the right lower extremity. ? ?Physical Exam ?Vitals and nursing note reviewed.  ?HENT:  ?   Head: Normocephalic and atraumatic.  ?   Mouth/Throat:  ?   Pharynx: Oropharynx is clear.  ?Eyes:  ?   Extraocular Movements: Extraocular movements intact.  ?   Pupils: Pupils are equal, round, and reactive to light.  ?Cardiovascular:  ?   Rate and Rhythm: Normal rate and regular rhythm.  ?Pulmonary:  ?   Comments: Decreased breath sounds bilaterally.  ?Abdominal:  ?   Palpations:  Abdomen is soft.  ?Musculoskeletal:     ?   General: Normal range of motion.  ?   Cervical back: Normal range of motion.  ?Skin: ?   General: Skin is warm.  ?Neurological:  ?   General: No focal deficit present.  ?   Mental Status: She is alert and oriented to person, place, and time.  ?Psychiatric:     ?   Behavior: Behavior normal.     ?   Judgment: Judgment normal.  ? ? ? ?LABORATORY DATA:  ?I have reviewed the data as listed ?Lab Results  ?Component Value Date  ? WBC 9.0 04/23/2021  ? HGB 14.5 04/23/2021  ? HCT 41.7 04/23/2021  ? MCV 93.1 04/23/2021  ? PLT 255 04/23/2021  ? ?Recent Labs  ?  04/23/21 ?1449 11/07/21 ?0844  ?NA 139  --   ?K 3.7  --   ?CL 107  --   ?CO2 26  --   ?GLUCOSE 87  --   ?BUN 20 18  ?CREATININE 0.59 0.66  ?CALCIUM 8.9  --   ?GFRNONAA >60 >60  ? ? ?RADIOGRAPHIC STUDIES: ?I have personally reviewed the radiological images as listed and agreed with the findings in the report. ?PERIPHERAL VASCULAR CATHETERIZATION ? ?Result Date: 11/07/2021 ?See surgical note for  result. ? ?VAS [place] LOWER EXTREMITY VENOUS (DVT) ? ?Result Date: 11/02/2021 ? Lower Venous DVT Study Patient Name:  MYLASIA VORHEES  Date of Exam:   11/02/2021 Medical Rec #: [id number]       Accession #:    [id number] Dat

## 2021-11-15 NOTE — Progress Notes (Signed)
P t states she may be having an allergic reaction to something. She has a rash on both legs. ? ?She states she keeps falling and getting dizzy for a while. ?

## 2021-11-15 NOTE — Assessment & Plan Note (Addendum)
#   History of repeated/recurrent DVT of the right lower extremity- most recently question acute/chronic femoral/popliteal vein DVT- currently on Eliquis as per vascular/ Dr.Schneir. Continue Eliquis for now. Patient will need indefinite anticoagulation. ? ?#Is unclear if patient has failed Eliquis-symptoms seems to be more consistent with chronic postphlebitic syndrome.  Unfortunately I think patient has had multiple anticoagulants in the lifetime including Coumadin; Eliquis; Xarelto; Pradaxa.  I would recommend continuing Eliquis 5 mg twice daily-unless there is any frank failure of anticoagulation; especially given the limited other options.  Other options would include addition of antiplatelet therapy-like aspirin/plavix etc. Will discuss with vascular; and also make a referral to Dr.Moll; [facility]-hematology/thrombosis program.  Patient previously seen at [facility] in the past. ?? ?# post phlebitic syndrome/ or possibly in stent thrombosis as noted on imaging in May of 2014 at [facility]. Recommend stockings; will check with Dr.Schneir.  ?? ?# Rash- on Right LE- ?  Likely allergy to medications-continue hydrocortisone. ? ?# Hx of Falls- dizzy/ Right LE gives away /knee buckles]-chronic s/p PT/ water aerobics [house]-recommend caution/follow-up with PCP. ? ?# DISPOSITION: ?# labs today ?# referral to [facility]/ or hematology re: recurrent DVTs on anti-coagulation-  ?# follow up TBD- dr.B ? ? ??#Discussed with Dr. Delana Meyer; based on his observations on venogram-suggestive of more chronic thrombophlebitis rather than acute blood clot.  Agrees with evaluation at [facility] prior history of multiple blood thinners.  As per patient history? pradaxa was discontinued because of ?bleeding.  Defer to [facility] for further recommendations. ? ?

## 2021-11-17 LAB — BETA-2-GLYCOPROTEIN I ABS, IGG/M/A
Beta-2 Glyco I IgG: <9 GPI IgG units (ref 0–20)
Beta-2-Glycoprotein I IgA: <9 GPI IgA units (ref 0–25)
Beta-2-Glycoprotein I IgM: <9 GPI IgM units (ref 0–32)

## 2021-11-21 LAB — ANTIPHOSPHOLIPID SYNDROME PROF
Anticardiolipin IgG: <9 GPL U/mL (ref 0–14)
Anticardiolipin IgM: <9 MPL U/mL (ref 0–12)
DRVVT: 38.6 s (ref 0.0–47.0)
PTT Lupus Anticoagulant: 33.2 s (ref 0.0–43.5)

## 2021-11-21 LAB — HEX PHASE PHOSPHOLIPID REFLEX

## 2021-11-21 LAB — HEXAGONAL PHASE PHOSPHOLIPID: Hex Phosph Neut Test: 6 s (ref 0–11)

## 2021-11-22 ENCOUNTER — Telehealth: Payer: Self-pay | Admitting: Internal Medicine

## 2021-11-22 NOTE — Telephone Encounter (Signed)
Spoke to patient regarding the results of the antiphospholipid antibody panel-negative for APS.  Given the complicated hematologic history recommend continued treatment with Eliquis.  Recommend follow-up with [facility] hematology.  Patient agreement.  We will follow up with [place] only as needed. ? ?GB ?

## 2021-11-27 ENCOUNTER — Encounter (INDEPENDENT_AMBULATORY_CARE_PROVIDER_SITE_OTHER): Payer: Self-pay | Admitting: Vascular Surgery

## 2021-11-27 ENCOUNTER — Ambulatory Visit (INDEPENDENT_AMBULATORY_CARE_PROVIDER_SITE_OTHER): Payer: Medicaid Other | Admitting: Vascular Surgery

## 2021-11-27 VITALS — BP 122/82 | HR 85 | Resp 16 | Wt 197.6 lb

## 2021-11-27 DIAGNOSIS — M159 Polyosteoarthritis, unspecified: Secondary | ICD-10-CM | POA: Diagnosis not present

## 2021-11-27 DIAGNOSIS — I87009 Postthrombotic syndrome without complications of unspecified extremity: Secondary | ICD-10-CM

## 2021-11-27 DIAGNOSIS — I82521 Chronic embolism and thrombosis of right iliac vein: Secondary | ICD-10-CM | POA: Diagnosis not present

## 2021-11-27 NOTE — Progress Notes (Signed)
? ? ? ? ?MRN : [id number] ? ?Caroline Madden is a 41 y.o. (03/14/1981) female who presents with chief complaint of legs hurt and swell. ? ?History of Present Illness:  ? ?The patient presents to the office for follow up of DVT.  DVT was identified at remotely and the patient has had venous intervention in the past.  On 11/07/2021 she underwent a venogram which showed extensive chronic changes and strictures of the femoral venous system with occlusion of a previously placed right iliac stent. ? ?The initial symptoms were pain and swelling in the lower extremity. ? ?The patient notes the affected leg continues to be extremely painful with dependency and swells severely.  Symptoms are better with elevation.     ? ?The patient has not been using compression therapy at this point. ? ?The patient is on anticoagulation.  Review with hematology she will continue Eliquis. ? ?No SOB or pleuritic chest pains.  No cough or hemoptysis. ? ?No blood per rectum or blood in any sputum.  No excessive bruising per the patient. ? ?The patient denies amaurosis fugax or recent TIA symptoms. There are no recent neurological changes noted. ?No recent episodes of angina or shortness of breath documented.  ?  ? ?No outpatient medications have been marked as taking for the 11/27/21 encounter (Appointment) with Delana Meyer, Dolores Lory, MD.  ? ? ?Past Medical History:  ?Diagnosis Date  ? Anxiety   ? NO MEDS  ? Cancer [facility])   ? skin ca  ? DVT (deep venous thrombosis) ([place]) 2002  ? Factor V Leiden ([place])   ? Headache   ? H/O MIGRAINES  ? History of kidney stones   ? Lymphedema   ? right leg-uses lymphedema pump  ? PE (pulmonary thromboembolism) ([place])   ? ? ?Past Surgical History:  ?Procedure Laterality Date  ? CYST EXCISION  08/20/2019  ? Scalp  ? GANGLION CYST EXCISION    ? IR FALLOPIAN TUBE CATHETERIZATION N/A   ? removal per pt. of 1 fallopian tube  ? IVC FILTER INSERTION    ? LAPAROSCOPIC OVARIAN CYSTECTOMY Left 09/27/2020  ? Procedure: LAPAROSCOPIC  OVARIAN CYSTECTOMY;  Surgeon: Gae Dry, MD;  Location: [facility] ORS;  Service: Gynecology;  Laterality: Left;  ? LAPAROSCOPIC UNILATERAL SALPINGECTOMY Left 09/27/2020  ? Procedure: LAPAROSCOPIC UNILATERAL SALPINGECTOMY;  Surgeon: Gae Dry, MD;  Location: [facility] ORS;  Service: Gynecology;  Laterality: Left;  ? PERIPHERAL VASCULAR THROMBECTOMY Right 11/07/2021  ? Procedure: PERIPHERAL VASCULAR THROMBECTOMY;  Surgeon: Katha Cabal, MD;  Location: [place] [facility];  Service: Cardiovascular;  Laterality: Right;  ? WISDOM TOOTH EXTRACTION    ? ? ?Social History ?Social History  ? ?Tobacco Use  ? Smoking status: Never  ? Smokeless tobacco: Never  ?Vaping Use  ? Vaping Use: Never used  ?Substance Use Topics  ? Alcohol use: No  ? Drug use: No  ? ? ?Family History ?Family History  ?Problem Relation Age of Onset  ? Lupus Mother   ? Stroke Father   ? Parkinson's disease Father   ? Hypertension Father   ? Gout Father   ? Colon cancer Maternal Grandmother   ? Stomach cancer Maternal Grandmother   ? ? ?Allergies  ?Allergen Reactions  ? Wheat Bran Diarrhea  ?  bloating  ? Penicillin V Potassium Rash  ? Strawberry Extract Rash  ? ? ? ?REVIEW OF SYSTEMS (Negative unless checked) ? ?Constitutional: '[]'$ Weight loss  '[]'$ Fever  '[]'$ Chills ?Cardiac: '[]'$ Chest pain   '[]'$ Chest  pressure   '[]'$ Palpitations   '[]'$ Shortness of breath when laying flat   '[]'$ Shortness of breath with exertion. ?Vascular:  '[]'$ Pain in legs with walking   '[x]'$ Pain in legs at rest  '[]'$ History of DVT   '[]'$ Phlebitis   '[x]'$ Swelling in legs   '[]'$ Varicose veins   '[]'$ Non-healing ulcers ?Pulmonary:   '[]'$ Uses home oxygen   '[]'$ Productive cough   '[]'$ Hemoptysis   '[]'$ Wheeze  '[]'$ COPD   '[]'$ Asthma ?Neurologic:  '[]'$ Dizziness   '[]'$ Seizures   '[]'$ History of stroke   '[]'$ History of TIA  '[]'$ Aphasia   '[]'$ Vissual changes   '[]'$ Weakness or numbness in arm   '[]'$ Weakness or numbness in leg ?Musculoskeletal:   '[]'$ Joint swelling   '[]'$ Joint pain   '[]'$ Low back pain ?Hematologic:  '[]'$ Easy bruising  '[]'$ Easy bleeding    '[]'$ Hypercoagulable state   '[]'$ Anemic ?Gastrointestinal:  '[]'$ Diarrhea   '[]'$ Vomiting  '[]'$ Gastroesophageal reflux/heartburn   '[]'$ Difficulty swallowing. ?Genitourinary:  '[]'$ Chronic kidney disease   '[]'$ Difficult urination  '[]'$ Frequent urination   '[]'$ Blood in urine ?Skin:  '[]'$ Rashes   '[]'$ Ulcers  ?Psychological:  '[]'$ History of anxiety   '[]'$  History of major depression. ? ?Physical Examination ? ?There were no vitals filed for this visit. ?There is no height or weight on file to calculate BMI. ?Gen: WD/WN, NAD ?Head: [place]/AT, No temporalis wasting.  ?Ear/Nose/Throat: Hearing grossly intact, nares w/o erythema or drainage, pinna without lesions ?Eyes: PER, EOMI, sclera nonicteric.  ?Neck: Supple, no gross masses.  No JVD.  ?Pulmonary:  Good air movement, no audible wheezing, no use of accessory muscles.  ?Cardiac: RRR, precordium not hyperdynamic. ?Vascular:  scattered varicosities present bilaterally.  Mild venous stasis changes to the legs bilaterally.  4+ firm pitting edema of the right leg ?Vessel Right Left  ?Radial Palpable Palpable  ?Gastrointestinal: soft, non-distended. No guarding/no peritoneal signs.  ?Musculoskeletal: M/S 5/5 throughout.  No deformity.  ?Neurologic: CN 2-12 intact. Pain and light touch intact in extremities.  Symmetrical.  Speech is fluent. Motor exam as listed above. ?Psychiatric: Judgment intact, Mood & affect appropriate for pt's clinical situation. ?Dermatologic: Venous rashes no ulcers noted.  No changes consistent with cellulitis. ?Lymph : No lichenification or skin changes of chronic lymphedema. ? ?CBC ?Lab Results  ?Component Value Date  ? WBC 9.0 04/23/2021  ? HGB 14.5 04/23/2021  ? HCT 41.7 04/23/2021  ? MCV 93.1 04/23/2021  ? PLT 255 04/23/2021  ? ? ?BMET ?   ?Component Value Date/Time  ? NA 139 04/23/2021 1449  ? NA 140 02/23/2012 2001  ? K 3.7 04/23/2021 1449  ? K 4.5 02/23/2012 2001  ? CL 107 04/23/2021 1449  ? CL 104 02/23/2012 2001  ? CO2 26 04/23/2021 1449  ? CO2 24 02/23/2012 2001  ? GLUCOSE 87  04/23/2021 1449  ? GLUCOSE 111 (H) 02/23/2012 2001  ? BUN 18 11/07/2021 0844  ? BUN 16 02/23/2012 2001  ? CREATININE 0.66 11/07/2021 0844  ? CREATININE 0.84 02/23/2012 2001  ? CALCIUM 8.9 04/23/2021 1449  ? CALCIUM 9.7 02/23/2012 2001  ? GFRNONAA >60 11/07/2021 0844  ? GFRNONAA >60 02/23/2012 2001  ? GFRAA >60 05/28/2017 0823  ? GFRAA >60 02/23/2012 2001  ? ?Estimated Creatinine Clearance: 80.4 mL/min (by C-G formula based on SCr of 0.66 mg/dL). ? ?COAG ?No results found for: INR, PROTIME ? ?Radiology ?PERIPHERAL VASCULAR CATHETERIZATION ? ?Result Date: 11/07/2021 ?See surgical note for result. ? ?VAS [place] LOWER EXTREMITY VENOUS (DVT) ? ?Result Date: 11/02/2021 ? Lower Venous DVT Study Patient Name:  Netta A Beedle  Date of Exam:  11/02/2021 Medical Rec #: [id number]       Accession #:    [id number] Date of Birth: [birth year]        Patient Gender: F Patient Age:   50 years Exam Location:  [facility] Procedure:      VAS [place] LOWER EXTREMITY VENOUS (DVT) Referring Phys: Eulogio Ditch --------------------------------------------------------------------------------  Indications: Pain.  Risk Factors: DVT History of DVT. Anticoagulation: Eliquis. Comparison Study: 10/02/2021 Performing Technologist: Almira Coaster RVS  Examination Guidelines: A complete evaluation includes B-mode imaging, spectral Doppler, color Doppler, and power Doppler as needed of all accessible portions of each vessel. Bilateral testing is considered an integral part of a complete examination. Limited examinations for reoccurring indications may be performed as noted. The reflux portion of the exam is performed with the patient in reverse Trendelenburg.  +---------+---------------+---------+-----------+----------+--------------+ RIGHT    CompressibilityPhasicitySpontaneityPropertiesThrombus Aging +---------+---------------+---------+-----------+----------+--------------+ CFV      Full           Yes      Yes                                  +---------+---------------+---------+-----------+----------+--------------+ SFJ      Full           Yes      Yes                                 +---------+---------------+---------+-----------+----------+------

## 2021-12-07 ENCOUNTER — Telehealth: Payer: Self-pay

## 2021-12-07 NOTE — Telephone Encounter (Signed)
[facility] for chronic dvt on the  status on referral appt sent to them 11/17/21.  Spoke with Caroline Madden, appt made for 03/05/22 at 2:30pm, pt aware.

## 2022-02-08 ENCOUNTER — Encounter (INDEPENDENT_AMBULATORY_CARE_PROVIDER_SITE_OTHER): Payer: Medicaid Other

## 2022-02-08 ENCOUNTER — Ambulatory Visit (INDEPENDENT_AMBULATORY_CARE_PROVIDER_SITE_OTHER): Payer: Medicaid Other | Admitting: Vascular Surgery

## 2022-02-08 ENCOUNTER — Encounter (INDEPENDENT_AMBULATORY_CARE_PROVIDER_SITE_OTHER): Payer: Self-pay

## 2022-02-14 ENCOUNTER — Telehealth (INDEPENDENT_AMBULATORY_CARE_PROVIDER_SITE_OTHER): Payer: Self-pay | Admitting: Vascular Surgery

## 2022-02-14 NOTE — Telephone Encounter (Signed)
Patient called in asking if her letter for disability was ready for pick up. I didn't see anything in the chart or anything noted that a letter was requested. She stated that Dr. Delana Meyer knows about it and agreed to write it for her. She is needing it for her lawyer ASAP.    Please call and advise

## 2022-02-27 NOTE — Telephone Encounter (Signed)
Pt calling to check status of disability letter. States she needs it ASAP. Please advise.

## 2022-02-28 NOTE — Telephone Encounter (Signed)
Has been address with Dr Delana Meyer from previous note

## 2022-03-05 NOTE — Progress Notes (Addendum)
 [facility]ematology [facility] New Patient Evaluation      41 y.o. 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  HISTORY OF PRESENT ILLNESS:    Questions the patient has to me today: 1. Why do I have sensation loss and tightness in my right leg and what can be done about that.  Ms. Buchanan was last seen in clinic 11/06/2012 and has been re-referred for evaluation of thrombosis.   Before 2014 Her VTE history prior to 2014 is summarized in the  assessment below.  2014 At time of last visit, recommended to stay on Xarelto 20 mg long-term.  CT venogram on 11/13/2012 showed Sequelae of right common to external iliac vein stent with limited evaluation of internal flow given beam hardening from the covered portion of the stent. Minimal internal flow in the superior aspect of the stent, however, no flow identified in the mid to distal aspect of the stent, suggesting thrombosis. Sequelae of chronic thrombosis in the common femoral vein on the right, as above.Given limited evaluation of the stent secondary to beam hardening from covered portion, this patient may be better followed with peripheral Doppler ultrasound.  Doppler ultrasound on 11/13/2012 showed There is evidence of continued obstruction in the proximal veins. 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   2017 -- partial occlusive chronic clot in R fem and pop venous system.  2017 to 2022 Clinic note by [facility] vascular surgery on 11/15/2021 states: JUne 2017- Fem/pop V DV- chronic/acute ; July 2017- Started on Eliquis [Dr.Schneir]; ? switched to xarelto; ? Switched on Eliquis- LE DOPPLERS- NOV 2022- Sonographic survey negative for evidence of acute DVT of the right lower extremity. Changes/sequela of prior right lower extremity  femoropopliteal DVT; Partially thrombosed venous varicosities/collaterals in the right inguinal region, compatible with superficial thrombophlebitis  2022 On 10 9 222 CT abdomen pelvis was done at [facility] health-not a CT venogram to evaluate for right thigh and groin pain and swelling.  This study result states: Right iliac venous stent is in place.  No visible DVT .  However, this was not a venogram study.  04/23/2021 Right leg venous Doppler ultrasound showed: Sonographic survey negative for evidence of acute DVT of the right lower extremity.  Changes/sequela of prior right lower extremity femoropopliteal DVT. Partially thrombosed venous varicosities/collaterals in the right inguinal region, compatible with superficial thrombophlebitis.   March 2023 10/02/2021 venous Doppler ultrasound study at [facility] in [place]: Findings consistent with acute deep vein thrombosis involving the right posterior tibial veins, and right popliteal vein. LEFT: No evidence of common femoral vein obstruction.   10/02/2021 vascular surgery clinic appointment at [facility] in [place] states The patient does have evidence of chronic thrombus in her right femoral vein however was noted today that there is an acute process in the popliteal space as well as in the tibial veins .  The vascular surgeon's - Dr. Zachary Shawl -assessment: I do not believe that the patient had a failure of Eliquis, because she was not taking a full dose. She notes that she has previously failed Xarelto so we will not transition her to that. Patient is advised to take her full dose of Eliquis starting today. She should take 1 pill in the morning on the evening.  She currently has an IVC filter in place and there is no need for replacement. She also has a DVT in the popliteal space as well as the tibial veins and this is typically not advisable for intervention. The patient has a lymphedema pump that she uses regularly. She is advised that  during this time she should utilize her lymphedema pump. She can place an Ace bandage instead. Otherwise we will have patient return in approximately 4 to 5 weeks to evaluate for possible propagation .    11/07/21: Follow-up visit with vascular surgery states: presents today for follow-up evaluation after recent fall and subsequent pain and swelling. She notes that she falls frequently. The patient does have evidence of chronic thrombus in her right femoral vein however was noted at the previous visit that there is an acute process in the popliteal space as well as in the tibial veins. The patient notes that she has a history of factor V Leiden she also has lymphedema. She uses a lymphedema pump daily. Initially she reported taking her Eliquis without pause however she only took one 5 mg tablet once a day, since her most recent visit the patient has been compliant with taking Eliquis twice a day. She denies any fevers or chills. She notes that she is following last surgery most recent office visit. She notes that the swelling seems to have gotten worse in the right lower extremity. Today it was noted that the patient has an acute process in the right femoral vein, in addition to the previous acute process in the popliteal and tibial veins. These findings suggest new clot progression.   On 11/07/2021 right leg venogram was done-report available-on health in [place].  Findings: Extensive chronic scarring of the popliteal and superficial femoral vein as well as the common femoral vein. The previously placed iliac stent is occluded. The inferior vena cava appears to be patent .  No intervention was done as the vascular surgeon was not able to cross the occluded iliac stent.    NOW Patient presented today with significant discomfort on her right side. She states that it's difficult to walk on most days. Her entire right side swells up, and the pain radiates up her leg. She is currently on Eliquis 5 mg twice  daily. Her copay is $4. She has a DOAC hate factor of 0/10; however, she still has a menstrual cycle despite her fallopian tubes being removed and her endometrial ablation. Sometimes her gums bleed. Of her 3-4 days of weekly menstrual bleeding, 1-2 of those days are heavy bleeding.  Patient complained about getting multiple clots per year. Each clot is accompanied with right leg tightness, swelling, and pain. Occasionally, the swelling would be focal at ankle, knee, or groin.   Patient states that she falls very frequently and her mobility is not very good; however, she has been strict with her diet. She reports having this leg tightness and numbness for the last 18 years on her right side. These symptoms have been getting worse over the past few years, and are triggered by activity, heat, and cold. These occur everyday. Her lymphedema pump helps, and keeping her legs propped up also help. Patient also claims that her toes are blue in the morning, specifically on her right foot.  PAST MEDICAL HISTORY:  1.  Recurrent DVT and PEs -  Estrogen associated with pregnancy in 2005; Surgery associated in 2011. 2.  Heterozygous today for factor V Leiden. 3.  In 6/11, antiphospholipid antibody screen was  positive. 4.  Obesity. 5.  History of an abdominoplasty. 6.  Wrist surgery, unclear if this was carpal tunnel syndrome or another surgery, but did not require inpatient stay. 7.  Several abdominal wall surgeries, benign nature prior to abdominoplasty 8. IVC filter in place  MEDICATIONS:     Outpatient Encounter Medications as of 03/05/2022  Medication Sig Dispense Refill  . buPROPion (WELLBUTRIN XL) 150 MG 24 hr tablet Take 1 tablet (150 mg total) by mouth daily.    SABRA ELIQUIS 5 mg Tab TAKE 1 TABLET (5 MG TOTAL) BY MOUTH EVERY 12 (TWELVE) HOURS FOR 180 DAYS    . fenofibrate (TRICOR) 48 MG tablet TAKE 1 TABLET (48 MG TOTAL) BY MOUTH EVERY DAY    . omeprazole (PRILOSEC) 40 MG capsule Take 1 capsule (40 mg  total) by mouth daily.    . CHROM PICO/BRINDALL BERRY (GARCINIA CAMBOGIA ORAL) Take 3 capsules by mouth daily. (Patient not taking: Reported on 03/05/2022)    . ergocalciferol (VITAMIN D2) 50,000 unit capsule Take 50,000 Units by mouth once a week. (Patient not taking: Reported on 03/05/2022)    . rivaroxaban (XARELTO) 20 mg tablet Take 1 tablet (20 mg total) by mouth daily. Frequency:QD   Dosage:20   MG  Instructions:  Note:Dose: 20MG  (Patient not taking: Reported on 03/05/2022)     No facility-administered encounter medications on file as of 03/05/2022.    SOCIAL HISTORY:  Lives in [place] with her daughter. Daughter is 43 years old and is a Holiday representative in [employer].  FAMILY HISTORY:  Has 1 child, age 53.  PHYSICAL EXAM:  BP 122/91 (BP Site: L Arm, BP Position: Sitting, BP Cuff Size: X-Large)   Pulse 82   Temp 36.3 C (97.3 F)   Ht 129.5 cm (4' 3)   Wt 85.6 kg (188 lb 12.8 oz)   LMP 02/24/2022   SpO2 98%   BMI 51.03 kg/m   General Appearance:    Alert, cooperative, no distress, appears stated age  Head:    Normocephalic, without obvious abnormality, atraumatic  Eyes:    PERRL, conjunctiva/corneas clear,   Ears:    Normal external ear canals, both ears  Nose:   Nares normal, no drainage or sinus tenderness  Throat:   Lips, mucosa, and tongue normal; teeth and gums normal  Neck:   Supple, symmetrical, trachea midline, no adenopathy;       thyroid:  No enlargement/tenderness/nodules  Back:     Symmetric, no curvature, ROM normal, no CVA tenderness  Lungs:     Clear to auscultation bilaterally, respirations unlabored  Chest wall:    No tenderness or deformity  Heart:    Regular rate and rhythm, S1 and S2 normal, no murmur, rub   or gallop  Abdomen:     Soft, non-tender, bowel sounds active all four quadrants,    no masses, no organomegaly  Extremities:   R mid-calf circumference > L by 4 cm. R mid-thigh circumference > L by 3.5 cm. R medial proximal thigh focal to diffuse swelling  in an area measuring about 12 cm in diameter. L mid-upper arm circumference > R by 2 cm.  Skin:   Skin color, texture, turgor normal, no rashes or lesions  Lymph nodes:   Cervical, supraclavicular, and axillary nodes normal      LABS:    On 11/15/2021: Lupus anticoagulant negative, antibeta 2 glycoprotein-I antibodies negative, creatinine 0.7. In March 2023 hemoglobin 16.5, hematocrit 49.6, MCV 92.2, WBC and platelets normal.  ASSESSMENT:  1. Venous thromboembolism: First episode of VTE, right or left leg DVT -- no report available to us  - in 2005, age 38, and lung clot.  VTE risk factors: A. Third trimester pregnancy, B. Obesity (BMI  around 40), C. Heterozygous factor V Leiden, D. LA positivity.  Anticoagulation for several months.    Second episode of VTE, right leg DVT diagnosed in 01/2010, age 49, right  leg proximal DVT. VTE risk factors: A. Abdominoplasty surgery on  12/28/09, i.e., 3-4 weeks prior to DVT diagnosis, B. BMI 40, C.  Heterozygous factor V Leiden, D. Lupus anticoagulant positivity.  Worsening symptoms in the first few days on warfarin and thus, thrombectomy and TPA and right pelvic vein stenting (in [place]). On warfarin until 04-2010, when she switched to Pradaxa because of fluctuating INRs. She self-discontinued  Pradaxa in Feb 2012.   Third episode of VTE, what appeared to be a new proximal DVT in the right leg diagnosed 05/02/2011 when she was not on anticoagulants.  Thereafter put back on warfarin, and then switched to Xarelto in July 2012.  Since 2014 she has, per patient report, continued to develop blood clots in her R leg.  She has chronic, intermittent fluctuating R leg leg tightness, more swelling - super tight, different to usual restless leg.  She was switched from Xarelto to Eliquis because new clot formed on Xarelto.  Now on long-term Eliquis 5 mg twice daily.  2.  Post thrombotic syndrome: Chronic right leg symptoms, activity limiting: The patient has a  variety of right leg symptoms of venous insufficiency/post thrombotic syndrome.  For last 18 years, only in R leg. Posterior calf gets tight if she is on her feet a lot, when it's hot and cold. Over the years worsening.  Symptoms present every day, predisposing her to falls; lymphedema pump helps a lot; legs propped up helps. On 11/07/2021 [facility]ogram  [facility] [place]: Extensive chronic scarring of the popliteal and superficial femoral vein as well as the common femoral vein. The previously placed iliac stent is occluded. The inferior vena cava appears to be patent .  No intervention was done as the vascular surgeon was not able to cross the occluded iliac stent.  I offered the patient to be seen by  the [facility] vascular interventional radiology group to see whether they want to schedule her for a repeat venogram.  I sent an epic message to [facility] attending Dr.Nikki Emelia and am waiting for response back.  Prior to venogram CT venogram of pelvis and legs would be appropriate and I ordered the CT.  3. Obesity: BMI 51.  4.  Falls: R leg give way at times; mobility is less; uses cane. PCP's comment 09/2021: Recurrent falls and unsteadiness associated with occipital headache; Rec MRI Brain: Declines Neurology referral;  Major recurrent Depression with decreased energy; Day time sleepiness and insomnia/ RLS; Declines sleep study ; Unsteadiness/ Hx of fall; Declines PT.  Some of the patient's unsteadiness appears to be due to right leg postthrombotic some mild discomfort.  I cannot tell whether there is an additional neurological issue.  5. R Leg tightness and numbness:   PLAN:    Eliquis 5 mg twice daily long-term CT venogram abdomen, pelvis and right leg Referral to [facility] ask interventional radiology for consideration of venography  Await response from Dr. Levon Emelia [facility] 5.  No follow-up with me was arranged    61 minutes were spent by me personally face-to-face with the patient. The  direct patient care time included face-to-face time with the patient, taking a history, examining the patient, reviewing records, communicating with other health care professionals about the patient, and explaining the various issues involved in the patient's care. More than 50 % of the time was spent counseling the patient regarding the pt's medical issues issues and coordinating the pt's  care.      Elia Parents, MD Professor of Medicine Division of Hematology [facility] [address] [place], [place] [address]  Clinic Main number: [phone] Fax: [phone] Certified Medical Assistant: Sonny Kitty Schedulers: Charmaine Shams, Darice Constable, Rocky Brigham Nurse: Will Mace Physician Assistant: Severo Lerner  [email or web address]      This note was dictated with Dragon Medical and may contain typos missed during proofreading.   I attest that I, Aasim Fernand, personally documented this note while acting as scribe for Elia Parents, MD.    Mina Fernand, Scribe. 03/05/2022  ______________________________________________   Documentation assistance provided by Medical Scribe, Mina Fernand, who was present during the entirety of the visit. I reviewed the note below and validated all of the information provided to ensure accuracy and completeness.   Elia Parents, MD     ADDENDUM 03/06/2022    ADDENDUM 07/17/2022  ------------------------------------------------------------

## 2022-03-22 ENCOUNTER — Encounter (INDEPENDENT_AMBULATORY_CARE_PROVIDER_SITE_OTHER): Payer: Self-pay | Admitting: Vascular Surgery

## 2022-03-22 NOTE — Progress Notes (Signed)
March 22, 2022     Caroline Madden [address] [place] [address]    To whom it may concern  I am writing this letter on behalf of of Caroline Madden in support of her disability claim.  I have been caring for Caroline Madden for over 5 years.  On my initial activation I was asked to assess her post phlebitic syndrome secondary to her multiple episodes of right lower extremity deep vein thrombosis.  Her thrombotic episodes began over 17 years ago as a complication of her first pregnancy.  Work-up of her problem has included evaluation by hematology and she has been determined to have factor V Leiden, this is a genetic trait which will be persistent throughout her entire life.  She was most recently seen in the office Nov 27, 2021.  At that visit we discussed the venography which had been performed November 07, 2021.  The results of this test demonstrated extensive scarring and near occlusion of her femoral veins on the right with complete occlusion of the right iliac veins including the previously placed stent.  This creates a situation where the blood returning to the heart is obstructed at the high thigh and pelvic level.  Essentially, her heart pumps a normal amount of blood into her leg but only a fraction is able to efficiently make its way out of the leg back to the heart.  This too is a permanent situation that Caroline Madden must live with lifelong.  Given that her stent is already occluded further surgeries or interventions have a very low probability of long-term durability and a near 0 chance of improvement long-term.  To better understand the degree of disability with which Caroline Madden must deal with every single day for the rest of her life, imagine the situation where more fluid (blood) is being pumped into her leg then can possibly get out.  This would be analogous to filling a water balloon until it becomes increasingly tight.  The obvious result is her leg will swell and given enough time this  swelling can become massive, which in and of itself makes ambulation extremely difficult.  The obvious second result is it is quite painful.  Imagine how you are face feels when you have a head cold and are troubled with severe congestion, your face feels like it is going to explode.  Safe to say we all find that sensation extremely uncomfortable and quite painful.  This is what her leg feels like on a day-to-day basis when she is up on her feet standing or sitting.  Essentially as soon as her right foot is below the level of her heart her right leg is becoming more and more congested with each passing minute.  The chronicity of her postphlebitic syndrome is now reflected in her physical exam which shows profound swelling / edema that has become quite hard and very tight.  I would also like to say that she has been a very compliant patient for the many years that I have been taking care of her.  She does wear her compression she does try to elevate her leg and she does use her lymphedema pump.  I feel confident in stating that she has maximized the available therapies to treat this condition.  Given the information that I have described above it is understandable why it is virtually impossible for her to stand for more than 20 or 30 minutes at a time.  Sitting with her feet dependent is essentially  the same thing and does not improve the situation.  Consequently, I do not see the potential for employment in light of her postphlebitic syndrome.  As noted above, this is a lifelong condition and will not improve but has the potential to become steadily worse if she is required to stand and sit for hour after hour.  I should also note that walking does not eliminate this problem as her lower extremities are still dependent with respect to her heart.  I fully support her request for disability.  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  Given the current technology there is no available  procedure to fix or remediate this.  She is already compliant and maximizing the available treatments.  If you require any further information please do not hesitate to contact my office.  Sincerely Katha Cabal, MD

## 2022-03-23 NOTE — Telephone Encounter (Signed)
done

## 2022-06-11 ENCOUNTER — Other Ambulatory Visit: Payer: Self-pay | Admitting: Internal Medicine

## 2022-06-11 DIAGNOSIS — Z1231 Encounter for screening mammogram for malignant neoplasm of breast: Secondary | ICD-10-CM

## 2022-11-20 ENCOUNTER — Ambulatory Visit
Admission: RE | Admit: 2022-11-20 | Discharge: 2022-11-20 | Disposition: A | Discharge status: Home / Self Care | Payer: Medicaid Other | Source: Ambulatory Visit | Attending: Internal Medicine | Admitting: Internal Medicine

## 2022-11-20 DIAGNOSIS — Z1231 Encounter for screening mammogram for malignant neoplasm of breast: Secondary | ICD-10-CM | POA: Insufficient documentation

## 2022-11-28 ENCOUNTER — Ambulatory Visit (INDEPENDENT_AMBULATORY_CARE_PROVIDER_SITE_OTHER): Payer: Medicaid Other | Admitting: Nurse Practitioner

## 2022-11-28 ENCOUNTER — Encounter (INDEPENDENT_AMBULATORY_CARE_PROVIDER_SITE_OTHER): Payer: Self-pay | Admitting: Nurse Practitioner

## 2022-11-28 VITALS — BP 136/88 | HR 85 | Resp 18 | Ht <= 58 in | Wt 190.2 lb

## 2022-11-28 DIAGNOSIS — D6851 Activated protein C resistance: Secondary | ICD-10-CM

## 2022-11-28 DIAGNOSIS — I87009 Postthrombotic syndrome without complications of unspecified extremity: Secondary | ICD-10-CM | POA: Diagnosis not present

## 2022-11-28 DIAGNOSIS — I890 Lymphedema, not elsewhere classified: Secondary | ICD-10-CM | POA: Diagnosis not present

## 2022-11-28 NOTE — Progress Notes (Addendum)
Subjective:    Patient ID: Caroline Madden, female    DOB: 1981/01/15, 42 y.o.   MRN: [id number] Chief Complaint  Patient presents with   Follow-up    f/u in 1 year with no studies    The patient presents to the office for follow up of lymphedema.  The patient's previously had a right lower extremity DVT.  DVT was identified at remotely and the patient has had venous intervention in the past.  On 11/07/2021 she underwent a venogram which showed extensive chronic changes and strictures of the femoral venous system with occlusion of a previously placed right iliac stent.   The initial symptoms were pain and swelling in the lower extremity.  She also has swelling in the right upper extremity as well.  She currently has hyperkeratosis with hyperplasia and hyperpigmentation   The patient notes the affected leg and arm continues to be extremely painful with dependency and swells severely.  Symptoms are better with elevation.      She does note that she has some balance difficulties and has continued falls.   The patient has been using compression therapy at this point.   The patient is on anticoagulation.  Review with hematology she will continue Eliquis.   No SOB or pleuritic chest pains.  No cough or hemoptysis.   No blood per rectum or blood in any sputum.  No excessive bruising per the patient.   The patient denies amaurosis fugax or recent TIA symptoms. There are no recent neurological changes noted. No recent episodes of angina or shortness of breath documented.   She has a lymphedema pump which she uses diligently and it is helpful but does not completely control the swelling that she has      Review of Systems  Cardiovascular:  Positive for leg swelling.  All other systems reviewed and are negative.      Objective:   Physical Exam Vitals reviewed.  HENT:     Head: Normocephalic.  Cardiovascular:     Rate and Rhythm: Normal rate.  Pulmonary:     Effort: Pulmonary effort  is normal.  Musculoskeletal:     Right lower leg: Edema present.  Skin:    General: Skin is warm and dry.     Comments: hyperkeratosis with hyperplasia and hyperpigmentation  Neurological:     Mental Status: She is alert and oriented to person, place, and time.  Psychiatric:        Mood and Affect: Mood normal.        Behavior: Behavior normal.        Thought Content: Thought content normal.        Judgment: Judgment normal.     BP 136/88 (BP Location: Left Wrist)   Pulse 85   Resp 18   Ht 4\' 3"  (1.295 m)   Wt 190 lb 3.2 oz (86.3 kg)   LMP 10/24/2022   BMI 51.41 kg/m   Past Medical History:  Diagnosis Date   Anxiety    NO MEDS   Cancer (HCC)    skin ca   DVT (deep venous thrombosis) (HCC) 2002   Factor V Leiden (HCC)    Headache    H/O MIGRAINES   History of kidney stones    Lymphedema    right leg-uses lymphedema pump   PE (pulmonary thromboembolism) (HCC)     Social History   Socioeconomic History   Marital status: Single    Spouse name: Not on file   Number  of children: Not on file   Years of education: Not on file   Highest education level: Not on file  Occupational History   Not on file  Tobacco Use   Smoking status: Never   Smokeless tobacco: Never  Vaping Use   Vaping Use: Never used  Substance and Sexual Activity   Alcohol use: No   Drug use: No   Sexual activity: Yes  Other Topics Concern   Not on file  Social History Narrative   Has a 68 y.o. daughter & a S.O. [name]    Social Determinants of Health   Financial Resource Strain: Not on file  Food Insecurity: Not on file  Transportation Needs: Not on file  Physical Activity: Not on file  Stress: Not on file  Social Connections: Not on file  Intimate Partner Violence: Not on file    Past Surgical History:  Procedure Laterality Date   CYST EXCISION  08/20/2019   Scalp   GANGLION CYST EXCISION     IR FALLOPIAN TUBE CATHETERIZATION N/A    removal per pt. of 1 fallopian tube    IVC FILTER INSERTION     LAPAROSCOPIC OVARIAN CYSTECTOMY Left 09/27/2020   Procedure: LAPAROSCOPIC OVARIAN CYSTECTOMY;  Surgeon: Nadara Mustard, MD;  Location: [facility];  Service: Gynecology;  Laterality: Left;   LAPAROSCOPIC UNILATERAL SALPINGECTOMY Left 09/27/2020   Procedure: LAPAROSCOPIC UNILATERAL SALPINGECTOMY;  Surgeon: Nadara Mustard, MD;  Location: [facility];  Service: Gynecology;  Laterality: Left;   PERIPHERAL VASCULAR THROMBECTOMY Right 11/07/2021   Procedure: PERIPHERAL VASCULAR THROMBECTOMY;  Surgeon: Renford Dills, MD;  Location: [facility];  Service: Cardiovascular;  Laterality: Right;   WISDOM TOOTH EXTRACTION      Family History  Problem Relation Age of Onset   Lupus Mother    Stroke Father    Parkinson's disease Father    Hypertension Father    Gout Father    Colon cancer Maternal Grandmother    Stomach cancer Maternal Grandmother     Allergies  Allergen Reactions   Wheat Diarrhea    bloating   Penicillin V Potassium Rash   Strawberry Extract Rash       Latest Ref Rng & Units 04/23/2021    2:49 PM 09/23/2020   11:27 AM 05/28/2017    8:23 AM  CBC  WBC 4.0 - 10.5 K/uL 9.0  6.7  8.2   Hemoglobin 12.0 - 15.0 g/dL 13.0  86.5  78.4   Hematocrit 36.0 - 46.0 % 41.7  46.6  47.6   Platelets 150 - 400 K/uL 255  268  298       CMP     Component Value Date/Time   NA 139 04/23/2021 1449   NA 140 02/23/2012 2001   K 3.7 04/23/2021 1449   K 4.5 02/23/2012 2001   CL 107 04/23/2021 1449   CL 104 02/23/2012 2001   CO2 26 04/23/2021 1449   CO2 24 02/23/2012 2001   GLUCOSE 87 04/23/2021 1449   GLUCOSE 111 (H) 02/23/2012 2001   BUN 18 11/07/2021 0844   BUN 16 02/23/2012 2001   CREATININE 0.66 11/07/2021 0844   CREATININE 0.84 02/23/2012 2001   CALCIUM 8.9 04/23/2021 1449   CALCIUM 9.7 02/23/2012 2001   PROT 8.0 02/23/2012 2001   ALBUMIN 4.2 02/23/2012 2001   AST 38 (H) 02/23/2012 2001   ALT 25 02/23/2012 2001   ALKPHOS 90 02/23/2012  2001   BILITOT 0.4 02/23/2012 2001   GFRNONAA >  60 11/07/2021 0844   GFRNONAA >60 02/23/2012 2001   GFRAA >60 05/28/2017 0823   GFRAA >60 02/23/2012 2001     No results found.     Assessment & Plan:   1. Lymphedema In addition to benefiting from a pump as noted below for the right upper extremity as well as the bilateral lower extremities, I believe she would benefit from referral to the lymphedema clinic as well.  Recommend:  No surgery or intervention at this point in time.  The patient has chronic lymphedema bilateral lower extremities and right upper extremity The Patient is CEAP [id number].  The patient has been wearing compression for more than 12 weeks with no or little benefit.  The patient has been exercising daily for more than 12 weeks. The patient has been elevating and taking OTC pain medications for more than 12 weeks.  None of these have have eliminated the pain related to the lymphedema or the discomfort regarding excessive swelling and venous congestion.    I have reviewed my discussion with the patient regarding lymphedema and why it  causes symptoms.  Patient will continue wearing graduated compression on a daily basis. The patient should put the compression on first thing in the morning and removing them in the evening. The patient should not sleep in the compression.   In addition, behavioral modification throughout the day will be continued.  This will include frequent elevation (such as in a recliner), use of over the counter pain medications as needed and exercise such as walking.  The systemic causes for chronic edema such as liver, kidney and cardiac etiologies do not appear to have significant changed over the past year.    The patient has chronic , severe lymphedema with hyperpigmentation of the skin and has done MLD, skin care, medication, diet, exercise, elevation and compression for 4 weeks with no improvement,  I am recommending a lymphedema pump.  The  patient still has stage 3 lymphedema and therefore, I believe that a lymph pump is needed to improve the control of the patient's lymphedema and improve the quality of life.  Additionally, a lymph pump is warranted because it will reduce the risk of cellulitis and ulceration in the future.  Patient should follow-up in six months  - Ambulatory referral to Occupational Therapy  2. Factor V Leiden (HCC) Given the patient's history of thrombus and factor V disease, patient should continue with Eliquis.  3. Post-phlebitic syndrome This is also contributing to the patient's discomfort in the right lower extremity in addition to the lymphedema.  Current Outpatient Medications on File Prior to Visit  Medication Sig Dispense Refill   buPROPion (WELLBUTRIN XL) 150 MG 24 hr tablet Take 150 mg by mouth daily.     Clobetasol Propionate 0.05 % shampoo PLEASE SEE ATTACHED FOR DETAILED DIRECTIONS     ELIQUIS 5 MG TABS tablet TAKE 1 TABLET BY MOUTH TWICE PER DAY 60 tablet 6   fenofibrate (TRICOR) 48 MG tablet Take 48 mg by mouth daily.     No current facility-administered medications on file prior to visit.    There are no Patient Instructions on file for this visit. No follow-ups on file.   Georgiana Spinner, NP

## 2023-01-29 ENCOUNTER — Ambulatory Visit: Payer: Medicaid Other | Attending: Nurse Practitioner | Admitting: Occupational Therapy

## 2023-01-29 ENCOUNTER — Encounter: Payer: Self-pay | Admitting: Occupational Therapy

## 2023-01-29 DIAGNOSIS — I890 Lymphedema, not elsewhere classified: Secondary | ICD-10-CM | POA: Insufficient documentation

## 2023-01-29 NOTE — Therapy (Unsigned)
OUTPATIENT OCCUPATIONAL THERAPY EVALUATION  LOWER EXTREMITY LYMPHEDEMA   Patient Name: Caroline Madden MRN: [id number] DOB:04/11/1981, 42 y.o., female Today's Date: 01/29/2023  END OF SESSION:   Past Medical History:  Diagnosis Date   Anxiety    NO MEDS   Cancer (HCC)    skin ca   DVT (deep venous thrombosis) (HCC) 2002   Factor V Leiden (HCC)    Headache    H/O MIGRAINES   History of kidney stones    Lymphedema    right leg-uses lymphedema pump   PE (pulmonary thromboembolism) (HCC)    Past Surgical History:  Procedure Laterality Date   CYST EXCISION  08/20/2019   Scalp   GANGLION CYST EXCISION     IR FALLOPIAN TUBE CATHETERIZATION N/A    removal per pt. of 1 fallopian tube   IVC FILTER INSERTION     LAPAROSCOPIC OVARIAN CYSTECTOMY Left 09/27/2020   Procedure: LAPAROSCOPIC OVARIAN CYSTECTOMY;  Surgeon: Nadara Mustard, MD;  Location: [facility];  Service: Gynecology;  Laterality: Left;   LAPAROSCOPIC UNILATERAL SALPINGECTOMY Left 09/27/2020   Procedure: LAPAROSCOPIC UNILATERAL SALPINGECTOMY;  Surgeon: Nadara Mustard, MD;  Location: [facility];  Service: Gynecology;  Laterality: Left;   PERIPHERAL VASCULAR THROMBECTOMY Right 11/07/2021   Procedure: PERIPHERAL VASCULAR THROMBECTOMY;  Surgeon: Renford Dills, MD;  Location: [facility];  Service: Cardiovascular;  Laterality: Right;   WISDOM TOOTH EXTRACTION     Patient Active Problem List   Diagnosis Date Noted   Right leg pain 09/14/2021   Occipital headache 09/14/2021   Menorrhagia 09/27/2020   Left ovarian cyst 08/06/2018   Adnexal mass 07/02/2018   Fibroid 07/02/2018   Lymphedema 04/28/2018   Weakness of both lower extremities 04/28/2018   Sleep disturbance 02/10/2018   Factor V Leiden (HCC) 02/11/2017   Lupus anticoagulant disorder (HCC) 02/11/2017   Shortness of breath 02/11/2017   DJD (degenerative joint disease) 02/11/2017   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) 02/10/2017    Post-phlebitic syndrome 02/10/2017   BMI 50.0-59.9, adult (HCC) 12/28/2015   Cramps of right lower extremity 12/28/2015   Dizziness 09/26/2015   History of DVT (deep vein thrombosis) 06/27/2015   Poor balance 06/27/2015    PCP: Barbette Reichmann, MD  REFERRING PROVIDER: Sheppard Plumber, NP  REFERRING DIAG: I89.0  THERAPY DIAG:  Lymphedema, not elsewhere classified  Rationale for Evaluation and Treatment: Rehabilitation  ONSET DATE: 01/29/23  SUBJECTIVE:  SUBJECTIVE STATEMENT: Caroline Madden is referred to Occupational Therapy  by Georgiana Spinner, NP, for evaluation and treatment of RLE / RLQ and RUE, RUQ lymphedema 2/2 unknown etiology.   PERTINENT HISTORY:   PAIN:  Are you having pain? Yes: NPRS scale: 5-10/10 Pain location: B lower extremities Pain description: burning, numbness, pins and needles, full, heavy, tired, weak, hot Aggravating factors: bad weather, standing, walking, dependent sitting Relieving factors: elevation, "lymphedema machine", being in water  PRECAUTIONS: Other: LYMPHEDEMA PRECAUTIONS  WEIGHT BEARING RESTRICTIONS: No  FALLS:  Has patient fallen in last 6 months? Not rated numerically, but describes frequent LOB  LIVING ENVIRONMENT: Lives with: lives with their family and lives with their daughter Lives in: House/apartment Stairs: Yes; External: y- 3 steps; can reach both Has following equipment at home: None  OCCUPATION:    LEISURE: ***  HAND DOMINANCE: right   PRIOR LEVEL OF FUNCTION: Independent  PATIENT GOALS: ***  OBJECTIVE:  OBSERVATIONS / OTHER ASSESSMENTS:   FOTO functional outcome measure: Intake score: 41%  Lymphedema Life Impact Scale (LLIS): Intake Score: 83.82%  COGNITION:Overall cognitive status: Within functional limits for tasks  assessed   POSTURE: WNL  LE ROM: WFL  LE MMT:  WFL  BLE COMPARATIVE LIMB VOLUMETRICS:  Mild, Stage  II, Bilateral Lower Extremity 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  Skin  Description Hyper-Keratosis Peau' de Orange Shiny Tight Fibrotic/ Indurated Fatty Doughy Spongy/ boggy       R>L x  x   Skin dry Flaky WNL Macerated   mildly      Color Redness Varicosities Blanching Hemosiderin Stain Mottled   x     x   Odor Malodorous Yeast Fungal infection  WNL      x   Temperature Warm Cool wnl    x     Pitting Edema   1+ 2+ 3+ 4+ Non-pitting         x   Girth Symmetrical Asymmetrical                   Distribution    R>L toes to groin    Stemmer Sign Positive Negative   +    Lymphorrhea History Of:  Present Absent     x    Wounds History Of Present Absent Venous Arterial Pressure Sheer     x        Signs of Infection Redness Warmth Erythema Acute Swelling Drainage Borders                    Sensation Light Touch Deep pressure Hypersensitivty   Present Impaired Present Impaired Absent Impaired   x Tactile  x  x     Nails WNL   Fungus nail dystrophy   x     Hair Growth Symmetrical Asymmetrical   x    Skin Creases Base of toes  Ankles   Base of Fingers knees       Abdominal pannus Thigh Lobules  Face/neck   x x  x        01/29/23 INTAKE  LANDMARK RIGHT    R LEG (A-D) TBA  R THIGH (E-G) ml  R FULL LIMB (A-G) ml  Limb Volume differential (LVD)  %  Volume change since initial %  Volume change overall V  (Blank rows = not tested)  LANDMARK LEFT    L LEG (A-D) TBA  L THIGH (E-G) ml  L  FULL LIMB (A-G) ml  Limb Volume differential (  LVD)  %  Volume change since initial %  Volume change overall %  (Blank rows = not tested)   GAIT: Distance walked: >500 ft Assistive device utilized: None Level of assistance: Extra time  TODAY'S TREATMENT:                                                                                                                                          DATE: ***  PATIENT EDUCATION:  Education details: *** Person educated: {Person educated:25204} Education method: {Education Method:25205} Education comprehension: {Education Comprehension:25206}  HOME EXERCISE PROGRAM: ***  ASSESSMENT:  CLINICAL IMPRESSION: Patient is a *** y.o. *** who was seen today for physical therapy evaluation and treatment for ***.   OBJECTIVE IMPAIRMENTS: {opptimpairments:25111}.   ACTIVITY LIMITATIONS: {activitylimitations:27494}  PARTICIPATION LIMITATIONS: {participationrestrictions:25113}  PERSONAL FACTORS: {Personal factors:25162} are also affecting patient's functional outcome.   REHAB POTENTIAL: {rehabpotential:25112}  CLINICAL DECISION MAKING: {clinical decision making:25114}  EVALUATION COMPLEXITY: {Evaluation complexity:25115}   GOALS: Goals reviewed with patient? {yes/no:20286}  SHORT TERM GOALS: Target date: ***  *** Baseline: Goal status: INITIAL  2.  *** Baseline:  Goal status: INITIAL  3.  *** Baseline:  Goal status: INITIAL  4.  *** Baseline:  Goal status: INITIAL  5.  *** Baseline:  Goal status: INITIAL  6.  *** Baseline:  Goal status: INITIAL  LONG TERM GOALS: Target date: ***  *** Baseline:  Goal status: INITIAL  2.  *** Baseline:  Goal status: INITIAL  3.  *** Baseline:  Goal status: INITIAL  4.  *** Baseline:  Goal status: INITIAL  5.  *** Baseline:  Goal status: INITIAL  6.  *** Baseline:  Goal status: INITIAL   PLAN:  PT FREQUENCY: {rehab frequency:25116}  PT DURATION: {rehab duration:25117}  PLANNED INTERVENTIONS: {rehab planned interventions:25118::"Therapeutic exercises","Therapeutic activity","Neuromuscular re-education","Balance training","Gait training","Patient/Family education","Self Care","Joint mobilization"}  PLAN FOR NEXT SESSION: ***   Loel Dubonnet, MS, OTR/L, CLT-LANA 01/29/23 8:22 AM

## 2023-01-30 ENCOUNTER — Encounter: Payer: Self-pay | Admitting: Occupational Therapy

## 2023-02-04 ENCOUNTER — Ambulatory Visit (INDEPENDENT_AMBULATORY_CARE_PROVIDER_SITE_OTHER): Payer: Medicaid Other | Admitting: Licensed Practical Nurse

## 2023-02-04 VITALS — BP 138/95 | HR 73 | Wt 191.6 lb

## 2023-02-04 DIAGNOSIS — Z30018 Encounter for initial prescription of other contraceptives: Secondary | ICD-10-CM | POA: Diagnosis not present

## 2023-02-04 DIAGNOSIS — N926 Irregular menstruation, unspecified: Secondary | ICD-10-CM | POA: Diagnosis not present

## 2023-02-04 LAB — POCT URINALYSIS DIPSTICK
Bilirubin, UA: NEGATIVE
Blood, UA: NEGATIVE
Glucose, UA: NEGATIVE
Ketones, UA: NEGATIVE
Leukocytes, UA: NEGATIVE
Nitrite, UA: NEGATIVE
Protein, UA: NEGATIVE
Spec Grav, UA: 1.01 (ref 1.010–1.025)
Urobilinogen, UA: 0.2 E.U./dL
pH, UA: 6.5 (ref 5.0–8.0)

## 2023-02-04 MED ORDER — PHEXXI 1.8-1-0.4 % VA GEL: 1.0000 | VAGINAL | 11 refills | Status: DC | PRN

## 2023-02-04 NOTE — Progress Notes (Signed)
HPI:      [name] is a 42 y.o. G1P1001 who LMP was No LMP recorded., presents today for a problem visit.  She complains of having 2 cycles a month for the last 2 months. Her most recent cycles were from July 4 through  the 7th and July 17 through 21, these cycles have vary between light and heavy with clots flow, she also feels pressure in her pelvis and back with the bleeding. Denies any urinary symptoms.   Pt reports she had an ablation 15 years ago that seemed to help. But does get a cycle monthly that tend to be light, but sometimes passes clots. She does get severe cramping but cannot take anything d/t her hx of DVT's.   In 2022 she had a Laparoscopic cystectomy with unilateral salpingectomy-she was to have a ablation during this procedure but the note sates :  "Exam at vagina reveals a cervix that has a anteverted angle, is stenotic, and is unable to be dilated to adequately perform hysteroscopy, and thus this procedure is not done." Fibroids were noted during the laparoscopy.  A laparoscopic hysterectomy was discussed at this time, the pt did not have the procedure done.    Caroline Madden is in a new relationship, she does not desire any future pregnancies. She is here today because she does not want to continue to have more than one period a month and would like to avoid pregnancy. She does not want to use hormones because her history  of DVT and current  treatment with Eliquis.     PMHx: She  has a past medical history of Anxiety, Cancer (HCC), DVT (deep venous thrombosis) (HCC) (2002), Factor V Leiden (HCC), Headache, History of kidney stones, Lymphedema, and PE (pulmonary thromboembolism) (HCC). Also,  has a past surgical history that includes Cyst excision (08/20/2019); IVC FILTER INSERTION; Ganglion cyst excision; Wisdom tooth extraction; Laparoscopic ovarian cystectomy (Left, 09/27/2020); Laparoscopic unilateral salpingectomy (Left, 09/27/2020); IR Fallopian Tube Catheterization (N/A);  and PERIPHERAL VASCULAR THROMBECTOMY (Right, 11/07/2021)., family history includes Colon cancer in her maternal grandmother; Gout in her father; Hypertension in her father; Lupus in her mother; Parkinson's disease in her father; Stomach cancer in her maternal grandmother; Stroke in her father.,  reports that she has never smoked. She has never used smokeless tobacco. She reports that she does not drink alcohol and does not use drugs.  She  Current Outpatient Medications:    buPROPion (WELLBUTRIN XL) 150 MG 24 hr tablet, Take 150 mg by mouth daily., Disp: , Rfl:    Clobetasol Propionate 0.05 % shampoo, PLEASE SEE ATTACHED FOR DETAILED DIRECTIONS, Disp: , Rfl:    ELIQUIS 5 MG TABS tablet, TAKE 1 TABLET BY MOUTH TWICE PER DAY, Disp: 60 tablet, Rfl: 6   fenofibrate (TRICOR) 48 MG tablet, Take 48 mg by mouth daily., Disp: , Rfl:    Lactic Ac-Citric Ac-Pot Bitart (PHEXXI) 1.8-1-0.4 % GEL, Place 1 each vaginally as needed (Up to 1 hour before intercourse)., Disp: 60 g, Rfl: 11  Also, is allergic to wheat, penicillin v potassium, and strawberry extract.  ROSsee HPI   Objective: BP (!) 138/95   Pulse 73   Wt 191 lb 9.6 oz (86.9 kg)   BMI 51.79 kg/m  Physical Exam Constitutional:      Appearance: Normal appearance.  Genitourinary:     Vulva normal.     Genitourinary Comments: External genitalia: WNL, no prolapse  Cervix pink no lesions, no discharge or bleeding.  Musculoskeletal:     Cervical back: Normal range of motion.  Neurological:     Mental Status: She is alert.  Skin:    General: Skin is warm.  Psychiatric:        Mood and Affect: Mood normal.   Last pap 2022NILM, HPV negative   ASSESSMENT/PLAN:  abnormal uterine bleeding   Problem List Items Addressed This Visit   None Visit Diagnoses     Encounter for initial prescription of other contraceptives    -  Primary   Relevant Medications   Lactic Ac-Citric Ac-Pot Bitart (PHEXXI) 1.8-1-0.4 % GEL   Irregular periods/menstrual  cycles       Relevant Orders   POCT Urinalysis Dipstick (Completed)       Dr Logan Bores called, with  the pt's hx of DVT, pt could try Micronor and skip one week between packs. Pt declines any hormonal treatment. She would like another ablation,   Reviewed reasons for abnormal bleeding could be  related to perimenopause to high BMI, rec weight loss of at least 10percent   Options for birth control reviewed, script for phexxi sent   Pt to follow up with MD to discuss surgical options, ablation versus hysterectomy, or tubal only.   Caroline Madden, CNM  [facility]  02/04/23  5:38 PM

## 2023-02-06 ENCOUNTER — Ambulatory Visit: Payer: Medicaid Other | Admitting: Occupational Therapy

## 2023-02-06 ENCOUNTER — Encounter: Payer: Self-pay | Admitting: Occupational Therapy

## 2023-02-06 DIAGNOSIS — I890 Lymphedema, not elsewhere classified: Secondary | ICD-10-CM | POA: Diagnosis not present

## 2023-02-06 NOTE — Therapy (Deleted)
OUTPATIENT OCCUPATIONAL THERAPY TREATMENT  LOWER EXTREMITY LYMPHEDEMA   Patient Name: LENORE MOYANO MRN: [id number] DOB:[birth year], 42 y.o., female Today's Date: 02/06/2023  END OF SESSION:   OT End of Session - 02/06/23 1245     Visit Number 2    Number of Visits 36    Date for OT Re-Evaluation 04/29/23    OT Start Time 0100    Activity Tolerance Patient tolerated treatment well;No increased pain    Behavior During Therapy WFL for tasks assessed/performed              Past Medical History:  Diagnosis Date   Anxiety    NO MEDS   Cancer (HCC)    skin ca   DVT (deep venous thrombosis) (HCC) 2002   Factor V Leiden (HCC)    Headache    H/O MIGRAINES   History of kidney stones    Lymphedema    right leg-uses lymphedema pump   PE (pulmonary thromboembolism) (HCC)    Past Surgical History:  Procedure Laterality Date   CYST EXCISION  08/20/2019   Scalp   GANGLION CYST EXCISION     IR FALLOPIAN TUBE CATHETERIZATION N/A    removal per pt. of 1 fallopian tube   IVC FILTER INSERTION     LAPAROSCOPIC OVARIAN CYSTECTOMY Left 09/27/2020   Procedure: LAPAROSCOPIC OVARIAN CYSTECTOMY;  Surgeon: Nadara Mustard, MD;  Location: [facility];  Service: Gynecology;  Laterality: Left;   LAPAROSCOPIC UNILATERAL SALPINGECTOMY Left 09/27/2020   Procedure: LAPAROSCOPIC UNILATERAL SALPINGECTOMY;  Surgeon: Nadara Mustard, MD;  Location: [facility];  Service: Gynecology;  Laterality: Left;   PERIPHERAL VASCULAR THROMBECTOMY Right 11/07/2021   Procedure: PERIPHERAL VASCULAR THROMBECTOMY;  Surgeon: Renford Dills, MD;  Location: [facility];  Service: Cardiovascular;  Laterality: Right;   WISDOM TOOTH EXTRACTION     Patient Active Problem List   Diagnosis Date Noted   Right leg pain 09/14/2021   Occipital headache 09/14/2021   Menorrhagia 09/27/2020   Left ovarian cyst 08/06/2018   Adnexal mass 07/02/2018   Fibroid 07/02/2018   Lymphedema 04/28/2018   Weakness of both lower  extremities 04/28/2018   Sleep disturbance 02/10/2018   Factor V Leiden (HCC) 02/11/2017   Lupus anticoagulant disorder (HCC) 02/11/2017   Shortness of breath 02/11/2017   DJD (degenerative joint disease) 02/11/2017   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 Pt has no new complaints since recent initial evaluation. Pt reports LE-related pain level as tightness below the R knee 5/10.   PERTINENT HISTORY: Relevant to lymphedema: chronic, R, iliac vein DVT: post phlebitic syndrome, has worn compression garments in the past, but they no longer fit (by report), sleep disturbance, RLS (by report), anxiety, hx skin Ca, obesity ( BMI = 51.4), Hx pulmonary thromboembolism  PAIN:  Are you having pain? Yes: NPRS scale: 5-10/10 Pain location: R LE below the knees Pain description:tightness Aggravating factors: bad weather, standing, walking, dependent sitting Relieving factors: elevation, "lymphedema machine", being in water  PRECAUTIONS: Other: LYMPHEDEMA PRECAUTIONS  Hx DVT; Hx PE  WEIGHT BEARING RESTRICTIONS: No  FALLS:  Has patient fallen in last 6 months? No,  but reports frequent falls and near misses- describes weakness  in her knees w frequent LOB  LIVING ENVIRONMENT: Lives with: lives with their family and  50 y o daughter with special needs Lives in: House/apartment Stairs: Yes; External: y- 3 steps; can reach both Has following equipment at home: None  HAND DOMINANCE: right   PRIOR LEVEL OF FUNCTION: Independent  PATIENT GOALS: to get everything back like it was before this started."   PERTINENT HISTORY: Insidious onset of BLE lymphedema ; denies family hx. chronic, R, iliac vein DVT;  post phlebitic syndrome, has worn compression garments in the past, but they do not fit because I am so short; sleep disturbance, Lupus ,restless leg (by report),   OBJECTIVE: OBSERVATIONS / OTHER ASSESSMENTS:   FOTO functional outcome measure: Intake score: 41%  Lymphedema Life Impact Scale (LLIS): Intake Score: 83.82% (The extent to which LE-related problems impacted daily life in the last week)  BLE COMPARATIVE LIMB VOLUMETRICS:   Initial 02/06/23  LANDMARK RIGHT    R LEG (A-D) TBA  R THIGH (E-G) ml  R FULL LIMB (A-G) ml  Limb Volume differential (LVD)  %  Volume change since initial %  Volume change overall V  (Blank rows = not tested)  LANDMARK LEFT    L LEG (A-D) TBA  L THIGH (E-G) ml  L  FULL LIMB (A-G) ml  Limb Volume differential (LVD)  %  Volume change since initial %  Volume change overall %  (Blank rows = not tested)   TODAY'S TREATMENT:                                                                                                                                         BLE comparative limb volumetrics Multilayer compression wraps to RLE- length  PATIENT EDUCATION:  Continued Pt edu re LE self care home program cmponents, including lymphatic pumping there ex, skin care, simple self Manual lymphatic drainage (MLD), compression, and elevation. Reviewed LE precautions and reviewed infection risk, including cellulitis precautions. Reviewed outcome of limb  volumetrics. Reviewed progress  towards goals to date. Discussed compression bandages vs garments, including application techniques and how they work. Answered questions to Pt's satisfaction.  Person educated: Patient Education method: Explanation, Demonstration, and Handouts Education comprehension: verbalized understanding, returned demonstration, and needs further education  HOME EXERCISE PROGRAM: 1.Therapeutic lymphatic pumping therex- 2 sets of 10 reps, hold 5 seconds; elements in order. 2 x daily PRN 2. During Intensive Phase CDT- multilayer short strretch compression wraps using gradient techniques. During Self-Management Phase, appropriate daytime compression garments and HOS device PRN. Custom-made gradient compression garments and HOS devices are medically necessary in this case because they are uniquely sized and shaped to fit the exact dimensions of the affected extremities with deformities, and to provide accurate and consistent gradient compression and containment, essential to optimally managing this patient's symptoms of chronic, progressive lymphedema. Multiple custom compression garments are needed for optimal hygiene to limit infection risk. Custom compression garments should be replaced q 3-6 months When worn consistently for optimal lipo-lymphedema self-management over time. 3. Daily skin care to sustain optimal hydration. Wash bites, scratches, blisters, etc with mild soap and water , apply antibacterial first aide cream and a bandaide. 4. Simple self-Manual lymphatic drainage (MLD) PRN. 5. Legs elevated to the level of the heart when seated.  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 Recently Ms Folk was diagnosed with RLE DVT, and Pt has had venous interventions in the past. 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 Progressing BLE lymphedema limits Pt's functional performance in all occupational domains, including functional ambulation and mobility ( standing, walking and dependent sitting, car transfers steps and stairs)   basic and instrumental ADLs ( Lower body dressing,  fitting shoes and LB clothing, driving, shopping, cooking, cleaning, caring for others) productive activities, leisure pursuits, social participation.in the community and psychosocial impairment in body image. BLE lymphedema contributes to elevated infection risk and increased falls risk due to body asymmetry. Ms. Brazel will benefit from skilled OT for Complete Decongestive Therapy (CDT) the current gold standard of manual lymphedema care. CDT includes manual lymphatic drainage (MLD), skin care to limit infection risk and increase skin excursion, lymphatic pumping exercise, and during the Intensive Phase, multilayer, gradient compression bandaging to reduce limb volume. Once limb volume reduction reaches a clinical plateau appropriate garments that provide appropriate compression and containment are recommended and fitted. Throughout the treatment course Pt/ caregiver will learn lymphedema prevention strategies and precautions, learn to perform all LE self-care home program components. Without skilled OT for lymphedema care, Pt's condition will progress and further functional decline is expected.  OBJECTIVE IMPAIRMENTS: decreased balance, decreased knowledge of condition, decreased knowledge of use of DME, decreased mobility, difficulty walking, decreased strength, increased edema, impaired sensation, obesity, pain, and chronic, progressive, bilateral lower extremity swelling and associated pain .   ACTIVITY LIMITATIONS: carrying, lifting, bending, sitting, standing, squatting, sleeping, stairs, transfers, bed mobility, bathing, dressing, and fitting  LB clothing and  shoes, driving, shopping, cooking, meal prep, running errands,  cleaning house  REHAB POTENTIAL: Good   GOALS: Goals reviewed with patient? Yes  SHORT TERM GOALS: Target date: 4th OT Rx visit   Pt will demonstrate understanding of lymphedema precautions and prevention strategies with modified independence using a printed reference to identify at least 5 precautions and discussing how s/he may  implement them into daily life to reduce risk of progression with extra time. Baseline:Max A Goal status: INITIAL  2.  Pt will be able to apply multilayer, knee length, gradient, compression wraps to one leg at a time with modified assistance (extra time and assistive device/s) to decrease limb volume, to limit infection risk, and to limit lymphedema progression.  Baseline: Dependent Goal status: INITIAL  LONG TERM GOALS: Target date: 04/29/23  Given this patient's Intake score of 41% on the functional outcomes FOTO tool, patient will experience an increase in function of 3 points to improve basic and instrumental ADLs performance, including lymphedema self-care.  Baseline: Max A Goal status: INITIAL  2.  Given this patient's Intake score of 83.82 % on the Lymphedema Life Impact Scale (LLIS), patient will experience a reduction of at least 5 points in her perceived level of functional impairment resulting from lymphedema to improve functional performance and quality of life (QOL). Baseline: 83.82% Goal status: INITIAL  3.  Pt will achieve at least a 10% volume reduction in B legs to return limb to typical size and shape, to limit infection risk and LE progression, to decrease pain, to improve function. Baseline: Dependent Goal status: INITIAL  4.  Pt will obtain proper compression garments/devices and achieve modified independence (extra time + assistive devices) with donning/doffing to optimize limb volume reductions and limit LE  progression over time. Baseline:  Goal status: INITIAL  5.  During Intensive phase CDT , with modified independence, Pt will  achieve at least 85% compliance with all lymphedema self-care home program components, including daily skin care, compression wraps and /or garments, simple self MLD and lymphatic pumping therex to habituate LE self care protocol  into ADLs for optimal LE self-management over time. Baseline: Dependent Goal status: INITIAL  PLAN:  PT FREQUENCY: 1-2x/week  PT DURATION: 12 weeks  PLANNED INTERVENTIONS: Therapeutic exercises, Therapeutic activity, Neuromuscular re-education, Balance training, Gait training, Patient/Family education, Self Care, Joint mobilization, DME instructions, Manual lymph drainage, Compression bandaging, Taping, Manual therapy, and Re-evaluation, skin care, fitting with custom daytime and HOS compression, training for assistive devices  PLAN FOR NEXT SESSION:  Teachself multilayer compression bandaging- RLECont  Pt edu re LE self-care   Loel Dubonnet, MS, OTR/L, CLT-LANA 02/06/23 12:47 PM

## 2023-02-08 ENCOUNTER — Ambulatory Visit: Payer: Medicaid Other | Admitting: Occupational Therapy

## 2023-02-08 NOTE — Therapy (Signed)
OUTPATIENT OCCUPATIONAL THERAPY TREATMENT NOTE  LOWER EXTREMITY LYMPHEDEMA   Patient Name: Caroline Madden MRN: [id number] DOB:30-Nov-1980, 42 y.o., female Today's Date: 02/08/2023  END OF SESSION:      Past Medical History:  Diagnosis Date   Anxiety    NO MEDS   Cancer (HCC)    skin ca   DVT (deep venous thrombosis) (HCC) 2002   Factor V Leiden (HCC)    Headache    H/O MIGRAINES   History of kidney stones    Lymphedema    right leg-uses lymphedema pump   PE (pulmonary thromboembolism) (HCC)    Past Surgical History:  Procedure Laterality Date   CYST EXCISION  08/20/2019   Scalp   GANGLION CYST EXCISION     IR FALLOPIAN TUBE CATHETERIZATION N/A    removal per pt. of 1 fallopian tube   IVC FILTER INSERTION     LAPAROSCOPIC OVARIAN CYSTECTOMY Left 09/27/2020   Procedure: LAPAROSCOPIC OVARIAN CYSTECTOMY;  Surgeon: Nadara Mustard, MD;  Location: [facility];  Service: Gynecology;  Laterality: Left;   LAPAROSCOPIC UNILATERAL SALPINGECTOMY Left 09/27/2020   Procedure: LAPAROSCOPIC UNILATERAL SALPINGECTOMY;  Surgeon: Nadara Mustard, MD;  Location: [facility];  Service: Gynecology;  Laterality: Left;   PERIPHERAL VASCULAR THROMBECTOMY Right 11/07/2021   Procedure: PERIPHERAL VASCULAR THROMBECTOMY;  Surgeon: Renford Dills, MD;  Location: [facility];  Service: Cardiovascular;  Laterality: Right;   WISDOM TOOTH EXTRACTION     Patient Active Problem List   Diagnosis Date Noted   Right leg pain 09/14/2021   Occipital headache 09/14/2021   Menorrhagia 09/27/2020   Left ovarian cyst 08/06/2018   Adnexal mass 07/02/2018   Fibroid 07/02/2018   Lymphedema 04/28/2018   Weakness of both lower extremities 04/28/2018   Sleep disturbance 02/10/2018   Factor V Leiden (HCC) 02/11/2017   Lupus anticoagulant disorder (HCC) 02/11/2017   Shortness of breath 02/11/2017   DJD (degenerative joint disease) 02/11/2017   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 Pt states pain is unchanged from initial evaluation on 7/24. Pt has no new complaints.   EVALUATION 02/06/23:Caroline Madden is referred to Occupational Therapy  by Georgiana Spinner, NP, for evaluation and treatment of RLE / RLQ and RUE, RUQ lymphedema 2/2 unknown etiology. Pt recalls worsening of leg swelling after having a DVT in her leg. Pt denies known family history of limb swelling. She has not undergone formal lymphedema treatment before; she does have  off-the-shelf  compression stockings, but currently they do not fit so she des not wear them. Pt's goal for Occupational Therapy lymphedema care is," to get everything back like it was before this started."  PERTINENT HISTORY: Relevant to lymphedema: chronic, R, iliac vein DVT: post phlebitic syndrome, has worn compression garments in the past, but they no longer fit (by report), sleep disturbance, RLS (by report), anxiety, hx skin Ca, obesity ( BMI = 51.4), Hx pulmonary thromboembolism  PAIN:  Are you having pain? Yes: NPRS scale: 5-10/10 Pain location: B thighs, B knees Pain description: burning, numbness, pins and needles, full, heavy, tired, weak, hot Aggravating factors: bad weather, standing,  walking, dependent sitting Relieving factors: elevation, "lymphedema machine", being in water  PRECAUTIONS: Other: LYMPHEDEMA PRECAUTIONS  WEIGHT BEARING RESTRICTIONS: No  FALLS:  Has patient fallen in last 6 months? No,  but reports frequent near misses- describes weakness in her knees w frequent LOB  LIVING ENVIRONMENT: Lives with:  73 y o daughter with special needs Lives in: House/apartment Stairs: Yes; External: y- 3 steps; can reach both Has following equipment at home: None  OCCUPATION: retired from [employer]  LEISURE: family  HAND DOMINANCE: right   PRIOR LEVEL OF FUNCTION: Independent  PATIENT GOALS: to get everything back like it was before this started."   PERTINENT HISTORY: Insidious onset of BLE lymphedema ; denies family hx. chronic, R, iliac vein DVT;  post phlebitic syndrome, has worn compression garments in the past, but they do not fit because I am so short; sleep disturbance, Lupus ,restless leg (by report),   OBJECTIVE: OBSERVATIONS / OTHER ASSESSMENTS:   FOTO functional outcome measure: Intake score: 41%  Lymphedema Life Impact Scale (LLIS): Intake Score: 83.82% (The extent to which LE-related problems impacted daily life in the last week)  COGNITION:Overall cognitive status: Within functional limits for tasks assessed   POSTURE: WNL  LE ROM: WFL  LE MMT:  WFL for tasks assessed  BLE COMPARATIVE LIMB VOLUMETRICS:    Initial 02/06/23 LANDMARK RIGHT  (dominant)  R LEG (A-D) 2491.6 ml  R THIGH (E-G) 4538.7 ml  R FULL LIMB (A-G) 7030.3 ml  Limb Volume differential (LVD)  LVD for LEG = 11.5 %, R>L; LVD THIGH = 2.2%, R>L; LVD FULL LIMB = 5.54%, R>L  Volume change since initial %  Volume change overall V  (Blank rows = not tested)  LANDMARK LEFT    L LEG (A-D) 2204.7 ml  L THIGH (E-G) 4435.7 ml  L  FULL LIMB (A-G) 6640.4 ml  Limb Volume differential (LVD)  %  Volume change since initial %  Volume change overall %  (Blank rows = not tested)    SKIN CONDITION: Mild, Stage  II, Bilateral Lower Extremity Lymphedema 2/2 suspected CVI and Obesity  TODAY'S TREATMENT:  BLE comparative limb volumetrics R leg multilayer, knee length compression bandages from toes to popliteal fossa Pt edu for LE self care   PATIENT EDUCATION:  Education details: Pt edu for outcome of initial volumetrics and implications for therapy. Pt edu for LE self care. Intro to compression wrapping Person educated: Patient Education method: Explanation, Demonstration, and Handouts Education comprehension: verbalized understanding, returned demonstration, and needs further education  HOME EXERCISE PROGRAM: 1.Therapeutic lymphatic pumping therex- 2 sets of 10 reps, hold 5 seconds; elements in order. 2 x daily PRN 2. During Intensive Phase CDT- multilayer short stretch compression wraps using gradient techniques. During Self-Management Phase, appropriate daytime compression garments and HOS device PRN.: Knee length wrap initially.  Custom-made gradient compression garments and HOS devices are medically necessary in this case because they are uniquely sized and shaped to fit the exact dimensions of the affected extremities with deformities, and to provide accurate and consistent gradient compression and containment, essential to optimally managing this patient's symptoms of chronic, progressive lymphedema. Multiple custom compression garments are needed for optimal hygiene to limit infection risk. Custom compression garments should be replaced q 3-6 months When worn consistently for optimal lipo-lymphedema self-management over time.  3. Daily skin care to sustain optimal hydration. Wash bites, scratches, blisters, etc with mild soap and water , apply antibacterial first aide cream and a band aide. 4. Simple self-Manual lymphatic drainage  (MLD) PRN.  ASSESSMENT:  CLINICAL IMPRESSION: Initial limb volumetrics reveal RLE swelling is concentrated below the knee in the leg. Pt was surprised that her thighs are essential the same volumetrically as this is where she experiences the most   pain and discomfort associated with lymphedema. Interestingly R thigh  appears larger by more than 2% by visual assessment. It may be that this patient's small stature accentuates even small amount of volumetric difference. Commenced compression wrapping to RLE to the knee only in an effort to build tolerance over time. Next session we'll wrap full limb to the groin an also start hands on teaching for wrapping. Cont as per POC.  OBJECTIVE IMPAIRMENTS: decreased balance, decreased knowledge of condition, decreased knowledge of use of DME, decreased mobility, difficulty walking, decreased strength, increased edema, impaired sensation, obesity, pain, and chronic, progressive, bilateral lower extremity swelling and associated pain .   ACTIVITY LIMITATIONS: carrying, lifting, bending, sitting, standing, squatting, sleeping, stairs, transfers, bed mobility, bathing, dressing, and fitting  LB clothing and  shoes, driving, shopping, cooking, meal prep, running errands, cleaning house  PERSONAL FACTORS: Past/current experiences, Time since onset of injury/illness/exacerbation, 3+ co morbidities,  body image, and are also affecting patient's functional outcome.   REHAB POTENTIAL: Good  EVALUATION COMPLEXITY: High   GOALS: Goals reviewed with patient? Yes  SHORT TERM GOALS: Target date: 4th OT Rx visit   Pt will demonstrate understanding of lymphedema precautions and prevention strategies with modified independence using a printed reference to identify at least 5 precautions and discussing how s/he may implement them into daily life to reduce risk of progression with extra time. Baseline:Max A Goal status: INITIAL  2.  Pt will be able to apply multilayer,  knee length, gradient, compression wraps to one leg at a time with modified assistance (extra time and assistive device/s) to decrease limb volume, to limit infection risk, and to limit lymphedema progression.  Baseline: Dependent Goal status: INITIAL  LONG TERM GOALS: Target date: 04/29/23  Given this patient's Intake score of 41% on the functional outcomes FOTO tool, patient will experience an increase in function  of 3 points to improve basic and instrumental ADLs performance, including lymphedema self-care.  Baseline: Max A Goal status: INITIAL  2.  Given this patient's Intake score of 83.82 % on the Lymphedema Life Impact Scale (LLIS), patient will experience a reduction of at least 5 points in her perceived level of functional impairment resulting from lymphedema to improve functional performance and quality of life (QOL). Baseline: 83.82% Goal status: INITIAL  3.  Pt will achieve at least a 10% volume reduction in B legs to return limb to typical size and shape, to limit infection risk and LE progression, to decrease pain, to improve function. Baseline: Dependent Goal status: INITIAL  4.  Pt will obtain proper compression garments/devices and achieve modified independence (extra time + assistive devices) with donning/doffing to optimize limb volume reductions and limit LE  progression over time. Baseline: Max A Goal status: INITIAL  5.  During Intensive phase CDT , with modified independence, Pt will achieve at least 85% compliance with all lymphedema self-care home program components, including daily skin care, compression wraps and /or garments, simple self MLD and lymphatic pumping therex to habituate LE self care protocol  into ADLs for optimal LE self-management over time. Baseline: Dependent Goal status: INITIAL  PLAN:  PT FREQUENCY: 1-2x/week  PT DURATION: 12 weeks  PLANNED INTERVENTIONS: Therapeutic exercises, Therapeutic activity, Neuromuscular re-education, Balance  training, Gait training, Patient/Family education, Self Care, Joint mobilization, DME instructions, Manual lymph drainage, Compression bandaging, Taping, Manual therapy, and Re-evaluation, skin care, fitting with custom daytime and HOS compression, training for assistive devices  PLAN FOR NEXT SESSION:  Initial BLE comparative limb volumetrics Initial knee or thigh length  multilayer compression bandaging- RLE Pt edu re LE self-care   Loel Dubonnet, MS, OTR/L, CLT-LANA 02/08/23 8:39 AM

## 2023-02-11 ENCOUNTER — Ambulatory Visit: Payer: Medicaid Other | Admitting: Occupational Therapy

## 2023-02-11 DIAGNOSIS — I890 Lymphedema, not elsewhere classified: Secondary | ICD-10-CM

## 2023-02-11 NOTE — Therapy (Signed)
OUTPATIENT OCCUPATIONAL THERAPY TREATMENT NOTE  LOWER EXTREMITY LYMPHEDEMA   Patient Name: Caroline Madden MRN: [id number] DOB:20-Apr-1981, 42 y.o., female Today's Date: 02/11/2023  END OF SESSION:   OT End of Session - 02/11/23 1609     Visit Number 3    Number of Visits 36    Date for OT Re-Evaluation 04/29/23    OT Start Time 0200    OT Stop Time 0300    OT Time Calculation (min) 60 min    Activity Tolerance Patient tolerated treatment well;No increased pain    Behavior During Therapy WFL for tasks assessed/performed               Past Medical History:  Diagnosis Date   Anxiety    NO MEDS   Cancer (HCC)    skin ca   DVT (deep venous thrombosis) (HCC) 2002   Factor V Leiden (HCC)    Headache    H/O MIGRAINES   History of kidney stones    Lymphedema    right leg-uses lymphedema pump   PE (pulmonary thromboembolism) (HCC)    Past Surgical History:  Procedure Laterality Date   CYST EXCISION  08/20/2019   Scalp   GANGLION CYST EXCISION     IR FALLOPIAN TUBE CATHETERIZATION N/A    removal per pt. of 1 fallopian tube   IVC FILTER INSERTION     LAPAROSCOPIC OVARIAN CYSTECTOMY Left 09/27/2020   Procedure: LAPAROSCOPIC OVARIAN CYSTECTOMY;  Surgeon: Nadara Mustard, MD;  Location: [facility];  Service: Gynecology;  Laterality: Left;   LAPAROSCOPIC UNILATERAL SALPINGECTOMY Left 09/27/2020   Procedure: LAPAROSCOPIC UNILATERAL SALPINGECTOMY;  Surgeon: Nadara Mustard, MD;  Location: [facility];  Service: Gynecology;  Laterality: Left;   PERIPHERAL VASCULAR THROMBECTOMY Right 11/07/2021   Procedure: PERIPHERAL VASCULAR THROMBECTOMY;  Surgeon: Renford Dills, MD;  Location: [facility];  Service: Cardiovascular;  Laterality: Right;   WISDOM TOOTH EXTRACTION     Patient Active Problem List   Diagnosis Date Noted   Right leg pain 09/14/2021   Occipital headache 09/14/2021   Menorrhagia 09/27/2020   Left ovarian cyst 08/06/2018   Adnexal mass 07/02/2018    Fibroid 07/02/2018   Lymphedema 04/28/2018   Weakness of both lower extremities 04/28/2018   Sleep disturbance 02/10/2018   Factor V Leiden (HCC) 02/11/2017   Lupus anticoagulant disorder (HCC) 02/11/2017   Shortness of breath 02/11/2017   DJD (degenerative joint disease) 02/11/2017   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 Pt states pain is unchanged from initial evaluation on 7/24. Pt has no new complaints.   EVALUATION 02/06/23:Caroline Madden is referred to Occupational Therapy  by Georgiana Spinner, NP, for evaluation and treatment of RLE / RLQ and RUE, RUQ lymphedema 2/2 unknown etiology. Pt recalls worsening of leg swelling after having a DVT in her leg. Pt denies known family history of limb swelling. She has not undergone formal lymphedema treatment before; she does have  off-the-shelf  compression stockings, but currently they do not fit so she des not wear them. Pt's goal for Occupational Therapy lymphedema care is," to get everything back like it was before this started."  PERTINENT HISTORY: Relevant to lymphedema: chronic, R, iliac vein DVT: post phlebitic syndrome, has worn compression garments in the past, but they no  longer fit (by report), sleep disturbance, RLS (by report), anxiety, hx skin Ca, obesity ( BMI = 51.4), Hx pulmonary thromboembolism  PAIN:  Are you having pain? Yes: NPRS scale: 5-10/10 Pain location: B thighs, B knees Pain description: burning, numbness, pins and needles, full, heavy, tired, weak, hot Aggravating factors: bad weather, standing, walking, dependent sitting Relieving factors: elevation, "lymphedema machine", being in water  PRECAUTIONS: Other: LYMPHEDEMA PRECAUTIONS  WEIGHT BEARING RESTRICTIONS: No  FALLS:  Has patient fallen in last 6 months? No,  but reports frequent near misses- describes weakness in her knees w frequent LOB  LIVING ENVIRONMENT: Lives with:  12 y o daughter with special needs Lives in: House/apartment Stairs: Yes; External: y- 3 steps; can reach both Has following equipment at home: None  OCCUPATION: retired from [employer]  LEISURE: family  HAND DOMINANCE: right   PRIOR LEVEL OF FUNCTION: Independent  PATIENT GOALS: to get everything back like it was before this started."   PERTINENT HISTORY: Insidious onset of BLE lymphedema ; denies family hx. chronic, R, iliac vein DVT;  post phlebitic syndrome, has worn compression garments in the past, but they do not fit because I am so short; sleep disturbance, Lupus ,restless leg (by report),   OBJECTIVE: OBSERVATIONS / OTHER ASSESSMENTS:   FOTO functional outcome measure: Intake score: 41%  Lymphedema Life Impact Scale (LLIS): Intake Score: 83.82% (The extent to which LE-related problems impacted daily life in the last week)  COGNITION:Overall cognitive status: Within functional limits for tasks assessed   POSTURE: WNL  LE ROM: WFL  LE MMT:  WFL for tasks assessed  BLE COMPARATIVE LIMB VOLUMETRICS:    Initial 02/06/23 LANDMARK RIGHT  (dominant)  R LEG (A-D) 2491.6 ml  R THIGH (E-G) 4538.7 ml  R FULL LIMB (A-G) 7030.3 ml  Limb Volume differential (LVD)  LVD for LEG = 11.5 %, R>L; LVD  THIGH = 2.2%, R>L; LVD FULL LIMB = 5.54%, R>L  Volume change since initial %  Volume change overall V  (Blank rows = not tested)  LANDMARK LEFT    L LEG (A-D) 2204.7 ml  L THIGH (E-G) 4435.7 ml  L  FULL LIMB (A-G) 6640.4 ml  Limb Volume differential (LVD)  %  Volume change since initial %  Volume change overall %  (Blank rows = not tested)   SKIN CONDITION: Mild, Stage  II, Bilateral Lower Extremity Lymphedema 2/2 suspected CVI and Obesity  TODAY'S TREATMENT:  BLE comparative limb volumetrics R leg multilayer, knee length compression bandages from toes to popliteal fossa Pt edu for LE self care   PATIENT EDUCATION:  Education details: Pt edu for outcome of initial volumetrics and implications for therapy. Pt edu for LE self care. Intro to compression wrapping Person educated: Patient Education method: Explanation, Demonstration, and Handouts Education comprehension: verbalized understanding, returned demonstration, and needs further education  HOME EXERCISE PROGRAM: 1.Therapeutic lymphatic pumping therex- 2 sets of 10 reps, hold 5 seconds; elements in order. 2 x daily PRN 2. During Intensive Phase CDT- multilayer short stretch compression wraps using gradient techniques. During Self-Management Phase, appropriate daytime compression garments and HOS device PRN.: Knee length wrap initially.  Custom-made gradient compression garments and HOS devices are medically necessary in this case because they are uniquely sized and shaped to fit the exact dimensions of the affected extremities with deformities, and to provide accurate and consistent gradient compression and containment, essential to optimally managing this patient's symptoms of chronic, progressive lymphedema. Multiple custom compression garments are needed for optimal hygiene to limit infection  risk. Custom compression garments should be replaced q 3-6 months When worn consistently for optimal lipo-lymphedema self-management over time.  3. Daily skin care to sustain optimal hydration. Wash bites, scratches, blisters, etc with mild soap and water , apply antibacterial first aide cream and a band aide. 4. Simple self-Manual lymphatic drainage (MLD) PRN.  ASSESSMENT:  CLINICAL IMPRESSION: Commenced MLD to RLE/RLE. after introductory level teaching. Pt verbalized understanding of rational and techniques. Continued compression wrapping to RLE to the knee only in an effort to build tolerance over time. Next session we'll wrap full limb to the groin an also start hands on teaching for wrapping. Cont as per POC.  OBJECTIVE IMPAIRMENTS: decreased balance, decreased knowledge of condition, decreased knowledge of use of DME, decreased mobility, difficulty walking, decreased strength, increased edema, impaired sensation, obesity, pain, and chronic, progressive, bilateral lower extremity swelling and associated pain .   ACTIVITY LIMITATIONS: carrying, lifting, bending, sitting, standing, squatting, sleeping, stairs, transfers, bed mobility, bathing, dressing, and fitting  LB clothing and  shoes, driving, shopping, cooking, meal prep, running errands, cleaning house  PERSONAL FACTORS: Past/current experiences, Time since onset of injury/illness/exacerbation, 3+ co morbidities,  body image, and are also affecting patient's functional outcome.   REHAB POTENTIAL: Good  EVALUATION COMPLEXITY: High   GOALS: Goals reviewed with patient? Yes  SHORT TERM GOALS: Target date: 4th OT Rx visit   Pt will demonstrate understanding of lymphedema precautions and prevention strategies with modified independence using a printed reference to identify at least 5 precautions and discussing how s/he may implement them into daily life to reduce risk of progression with extra time. Baseline:Max A Goal status:  INITIAL  2.  Pt will be able to apply multilayer, knee length, gradient, compression wraps to one leg at a time with modified assistance (extra time and assistive device/s) to decrease limb volume, to limit infection risk, and to limit lymphedema progression.  Baseline: Dependent Goal status: INITIAL  LONG TERM GOALS: Target date: 04/29/23  Given this patient's Intake score of 41% on the functional outcomes FOTO tool, patient will experience an increase in function of 3 points to improve basic and instrumental ADLs performance, including lymphedema self-care.  Baseline: Max A Goal status: INITIAL  2.  Given this patient's Intake score of 83.82 % on the Lymphedema Life Impact Scale (LLIS), patient will experience a reduction of at least 5 points in her perceived level of functional  impairment resulting from lymphedema to improve functional performance and quality of life (QOL). Baseline: 83.82% Goal status: INITIAL  3.  Pt will achieve at least a 10% volume reduction in B legs to return limb to typical size and shape, to limit infection risk and LE progression, to decrease pain, to improve function. Baseline: Dependent Goal status: INITIAL  4.  Pt will obtain proper compression garments/devices and achieve modified independence (extra time + assistive devices) with donning/doffing to optimize limb volume reductions and limit LE  progression over time. Baseline: Max A Goal status: INITIAL  5.  During Intensive phase CDT , with modified independence, Pt will achieve at least 85% compliance with all lymphedema self-care home program components, including daily skin care, compression wraps and /or garments, simple self MLD and lymphatic pumping therex to habituate LE self care protocol  into ADLs for optimal LE self-management over time. Baseline: Dependent Goal status: INITIAL  PLAN:  PT FREQUENCY: 1-2x/week  PT DURATION: 12 weeks  PLANNED INTERVENTIONS: Therapeutic exercises,  Therapeutic activity, Neuromuscular re-education, Balance training, Gait training, Patient/Family education, Self Care, Joint mobilization, DME instructions, Manual lymph drainage, Compression bandaging, Taping, Manual therapy, and Re-evaluation, skin care, fitting with custom daytime and HOS compression, training for assistive devices  PLAN FOR NEXT SESSION:  Initial BLE comparative limb volumetrics Initial knee or thigh length  multilayer compression bandaging- RLE Pt edu re LE self-care   Loel Dubonnet, MS, OTR/L, CLT-LANA 02/11/23 4:10 PM

## 2023-02-12 ENCOUNTER — Encounter: Payer: Medicaid Other | Admitting: Occupational Therapy

## 2023-02-14 ENCOUNTER — Ambulatory Visit: Payer: Medicaid Other | Attending: Nurse Practitioner | Admitting: Occupational Therapy

## 2023-02-14 DIAGNOSIS — I890 Lymphedema, not elsewhere classified: Secondary | ICD-10-CM | POA: Insufficient documentation

## 2023-02-14 NOTE — Therapy (Signed)
OUTPATIENT OCCUPATIONAL THERAPY TREATMENT NOTE  LOWER EXTREMITY LYMPHEDEMA   Patient Name: Caroline Madden MRN: [id number] DOB:[birth year], 42 y.o., female Today's Date: 02/15/2023  END OF SESSION:   OT End of Session - 02/14/23 1512     Visit Number 4    Number of Visits 36    Date for OT Re-Evaluation 04/29/23    OT Start Time 0304    OT Stop Time 0355    OT Time Calculation (min) 51 min    Activity Tolerance Patient tolerated treatment well;No increased pain    Behavior During Therapy WFL for tasks assessed/performed               Past Medical History:  Diagnosis Date   Anxiety    NO MEDS   Cancer (HCC)    skin ca   DVT (deep venous thrombosis) (HCC) 2002   Factor V Leiden (HCC)    Headache    H/O MIGRAINES   History of kidney stones    Lymphedema    right leg-uses lymphedema pump   PE (pulmonary thromboembolism) (HCC)    Past Surgical History:  Procedure Laterality Date   CYST EXCISION  08/20/2019   Scalp   GANGLION CYST EXCISION     IR FALLOPIAN TUBE CATHETERIZATION N/A    removal per pt. of 1 fallopian tube   IVC FILTER INSERTION     LAPAROSCOPIC OVARIAN CYSTECTOMY Left 09/27/2020   Procedure: LAPAROSCOPIC OVARIAN CYSTECTOMY;  Surgeon: Nadara Mustard, MD;  Location: [facility];  Service: Gynecology;  Laterality: Left;   LAPAROSCOPIC UNILATERAL SALPINGECTOMY Left 09/27/2020   Procedure: LAPAROSCOPIC UNILATERAL SALPINGECTOMY;  Surgeon: Nadara Mustard, MD;  Location: [facility];  Service: Gynecology;  Laterality: Left;   PERIPHERAL VASCULAR THROMBECTOMY Right 11/07/2021   Procedure: PERIPHERAL VASCULAR THROMBECTOMY;  Surgeon: Renford Dills, MD;  Location: [facility];  Service: Cardiovascular;  Laterality: Right;   WISDOM TOOTH EXTRACTION     Patient Active Problem List   Diagnosis Date Noted   Right leg pain 09/14/2021   Occipital headache 09/14/2021   Menorrhagia 09/27/2020   Left ovarian cyst 08/06/2018   Adnexal mass 07/02/2018    Fibroid 07/02/2018   Lymphedema 04/28/2018   Weakness of both lower extremities 04/28/2018   Sleep disturbance 02/10/2018   Factor V Leiden (HCC) 02/11/2017   Lupus anticoagulant disorder (HCC) 02/11/2017   Shortness of breath 02/11/2017   DJD (degenerative joint disease) 02/11/2017   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 presents to OT visit to address RLE lymphedema. Pt states pain is unchanged from initial evaluation on 7/24. She was able to apply compression wraps with extra time during visit interval. She wrapped her calf too tightly and ended up removing them and not using wraps today because they made her leg hurt.  PERTINENT HISTORY: Relevant to lymphedema: chronic, R, iliac vein DVT: post phlebitic syndrome, has worn compression garments in the past, but they no longer fit (by report), sleep disturbance, RLS (by report), anxiety, hx skin Ca, obesity ( BMI = 51.4), Hx pulmonary thromboembolism, lupus  PAIN:  Are you having pain? Yes: NPRS scale: 6/10 Pain location: B thighs, B knees Pain description: burning, numbness, pins and needles, full, heavy, tired, weak, hot Aggravating factors: bad weather, standing, walking, dependent sitting Relieving factors: elevation,  "lymphedema machine", being in water  PRECAUTIONS: Other: LYMPHEDEMA PRECAUTIONS  WEIGHT BEARING RESTRICTIONS: No  FALLS:  Has patient fallen in last 6 months? No,  but reports frequent near misses- describes weakness in her knees w frequent LOB  LIVING ENVIRONMENT: Lives with:  38 y o daughter with special needs Lives in: House/apartment Stairs: Yes; External: y- 3 steps; can reach both Has following equipment at home: None  OCCUPATION: retired from [employer]  LEISURE: family  HAND DOMINANCE: right   PRIOR LEVEL OF FUNCTION: Independent  PATIENT GOALS: to get everything back like it was before this started."   PERTINENT HISTORY: Insidious onset of BLE lymphedema ; denies family hx. chronic, R, iliac vein DVT;  post phlebitic syndrome, has worn compression garments in the past, but they do not fit because I am so short; sleep disturbance, Lupus ,restless leg (by report),   OBJECTIVE: OBSERVATIONS / OTHER ASSESSMENTS:   FOTO functional outcome measure: Intake score: 41%  Lymphedema Life Impact Scale (LLIS): Intake Score: 83.82% (The extent to which LE-related problems impacted daily life in the last week)  COGNITION:Overall cognitive status: Within functional limits for tasks assessed   POSTURE: WNL  LE ROM: WFL  LE MMT:  WFL for tasks assessed  BLE COMPARATIVE LIMB VOLUMETRICS:    Initial 02/06/23 LANDMARK RIGHT  (dominant)  R LEG (A-D) 2491.6 ml  R THIGH (E-G) 4538.7 ml  R FULL LIMB (A-G) 7030.3 ml  Limb Volume differential (LVD)  LVD for LEG = 11.5 %, R>L; LVD THIGH = 2.2%, R>L; LVD FULL LIMB = 5.54%, R>L  Volume change since initial %  Volume change overall V  (Blank rows = not tested)  LANDMARK LEFT    L LEG (A-D) 2204.7 ml  L THIGH (E-G) 4435.7 ml  L  FULL LIMB (A-G) 6640.4 ml  Limb Volume differential (LVD)  %  Volume change since initial %  Volume change overall %  (Blank rows = not tested)   SKIN CONDITION: Mild, Stage  II, Bilateral Lower  Extremity Lymphedema 2/2 suspected CVI and Obesity  TODAY'S TREATMENT:  RLE/RLQ MLD utilizing functional inguinal watershed R leg multilayer, knee length compression bandages from toes to popliteal fossa Pt edu for LE self care   PATIENT EDUCATION:  Education details: Continued lymphedema self-care training. Commenced teaching MLD Person educated: Patient Education method: Explanation, Demonstration, and Handouts Education comprehension: verbalized understanding, returned demonstration, and needs further education  HOME EXERCISE PROGRAM: 1.Therapeutic lymphatic pumping therex- 2 sets of 10 reps, hold 5 seconds; elements in order. 2 x daily PRN 2. During Intensive Phase CDT- multilayer short stretch compression wraps using gradient techniques. During Self-Management Phase, appropriate daytime compression garments and HOS device PRN.: Knee length wrap initially.  Custom-made gradient compression garments and HOS devices are medically necessary in this case because they are uniquely sized and shaped to fit the exact dimensions of the affected extremities with deformities, and to provide accurate and consistent gradient compression and containment, essential to optimally managing this patient's symptoms of chronic, progressive lymphedema. Multiple custom compression garments are needed for optimal hygiene to limit infection risk. Custom compression garments should be replaced q 3-6 months When worn consistently for optimal lipo-lymphedema self-management over time.  3. Daily skin care to sustain optimal hydration. Wash bites, scratches, blisters, etc with mild soap and water , apply antibacterial first aide cream and a band aide. 4. Simple self-Manual lymphatic drainage (MLD) PRN.  ASSESSMENT:  CLINICAL IMPRESSION: Pt presenting with postage stamp sized area of  reddened petechial on R lateral proximal calf. Skin irritation appears to be from sheer. We discussed importance of not over compressing so not to occlude lymph flow and blood circulation. No tourniquet effect observed with increased swelling below the knee. There are, however, signs of positive indentations from compression wraps, which Pt reports she took off during the night. At this time of day leg pain has significantly reduced bu report, but it still feels "sore". Will continue to closely monitor Pt carefully due to her hx of DVT.  Continued MLD to RLE/RLE and Pt edu for simple self-MLD.  Pt verbalized understanding of rational and techniques. Continued compression wrapping to RLE to the knee only in an effort to build tolerance over time. Next session we'll wrap full limb to the groin an also start hands on teaching for wrapping. Cont as per POC.  OBJECTIVE IMPAIRMENTS: decreased balance, decreased knowledge of condition, decreased knowledge of use of DME, decreased mobility, difficulty walking, decreased strength, increased edema, impaired sensation, obesity, pain, and chronic, progressive, bilateral lower extremity swelling and associated pain .   ACTIVITY LIMITATIONS: carrying, lifting, bending, sitting, standing, squatting, sleeping, stairs, transfers, bed mobility, bathing, dressing, and fitting  LB clothing and  shoes, driving, shopping, cooking, meal prep, running errands, cleaning house  PERSONAL FACTORS: Past/current experiences, Time since onset of injury/illness/exacerbation, 3+ co morbidities,  body image, and are also affecting patient's functional outcome.   REHAB POTENTIAL: Good  EVALUATION COMPLEXITY: High   GOALS: Goals reviewed with patient? Yes  SHORT TERM GOALS: Target date: 4th OT Rx visit   Pt will demonstrate understanding of lymphedema precautions and prevention strategies with modified independence using a printed reference to identify at least 5 precautions and  discussing how s/he may implement them into daily life to reduce risk of progression with extra time. Baseline:Max A Goal status: INITIAL  2.  Pt will be able to apply multilayer, knee length, gradient, compression wraps to one leg at a time with modified assistance (extra time and assistive device/s) to decrease limb volume, to limit infection risk, and to limit lymphedema  progression.  Baseline: Dependent Goal status: INITIAL  LONG TERM GOALS: Target date: 04/29/23  Given this patient's Intake score of 41% on the functional outcomes FOTO tool, patient will experience an increase in function of 3 points to improve basic and instrumental ADLs performance, including lymphedema self-care.  Baseline: Max A Goal status: INITIAL  2.  Given this patient's Intake score of 83.82 % on the Lymphedema Life Impact Scale (LLIS), patient will experience a reduction of at least 5 points in her perceived level of functional impairment resulting from lymphedema to improve functional performance and quality of life (QOL). Baseline: 83.82% Goal status: INITIAL  3.  Pt will achieve at least a 10% volume reduction in B legs to return limb to typical size and shape, to limit infection risk and LE progression, to decrease pain, to improve function. Baseline: Dependent Goal status: INITIAL  4.  Pt will obtain proper compression garments/devices and achieve modified independence (extra time + assistive devices) with donning/doffing to optimize limb volume reductions and limit LE  progression over time. Baseline: Max A Goal status: INITIAL  5.  During Intensive phase CDT , with modified independence, Pt will achieve at least 85% compliance with all lymphedema self-care home program components, including daily skin care, compression wraps and /or garments, simple self MLD and lymphatic pumping therex to habituate LE self care protocol  into ADLs for optimal LE self-management over time. Baseline: Dependent Goal  status: INITIAL  PLAN:  PT FREQUENCY: 1-2x/week  PT DURATION: 12 weeks  PLANNED INTERVENTIONS: Therapeutic exercises, Therapeutic activity, Neuromuscular re-education, Balance training, Gait training, Patient/Family education, Self Care, Joint mobilization, DME instructions, Manual lymph drainage, Compression bandaging, Taping, Manual therapy, and Re-evaluation, skin care, fitting with custom daytime and HOS compression, training for assistive devices  PLAN FOR NEXT SESSION:  Initial BLE comparative limb volumetrics Initial knee or thigh length  multilayer compression bandaging- RLE Pt edu re LE self-care   Loel Dubonnet, MS, OTR/L, CLT-LANA 02/15/23 8:19 AM

## 2023-02-15 ENCOUNTER — Encounter: Payer: Medicaid Other | Admitting: Occupational Therapy

## 2023-02-20 ENCOUNTER — Ambulatory Visit: Payer: Medicaid Other | Admitting: Occupational Therapy

## 2023-02-20 DIAGNOSIS — I890 Lymphedema, not elsewhere classified: Secondary | ICD-10-CM | POA: Diagnosis not present

## 2023-02-20 NOTE — Therapy (Signed)
OUTPATIENT OCCUPATIONAL THERAPY TREATMENT NOTE  LOWER EXTREMITY LYMPHEDEMA   Patient Name: Caroline Madden MRN: [id number] DOB:[birth year], 42 y.o., female Today's Date: 02/20/2023  END OF SESSION:   OT End of Session - 02/20/23 1410     Visit Number 5    Number of Visits 36    Date for OT Re-Evaluation 04/29/23    OT Start Time 0205    Activity Tolerance Patient tolerated treatment well;No increased pain    Behavior During Therapy WFL for tasks assessed/performed               Past Medical History:  Diagnosis Date   Anxiety    NO MEDS   Cancer (HCC)    skin ca   DVT (deep venous thrombosis) (HCC) 2002   Factor V Leiden (HCC)    Headache    H/O MIGRAINES   History of kidney stones    Lymphedema    right leg-uses lymphedema pump   PE (pulmonary thromboembolism) (HCC)    Past Surgical History:  Procedure Laterality Date   CYST EXCISION  08/20/2019   Scalp   GANGLION CYST EXCISION     IR FALLOPIAN TUBE CATHETERIZATION N/A    removal per pt. of 1 fallopian tube   IVC FILTER INSERTION     LAPAROSCOPIC OVARIAN CYSTECTOMY Left 09/27/2020   Procedure: LAPAROSCOPIC OVARIAN CYSTECTOMY;  Surgeon: Nadara Mustard, MD;  Location: [facility];  Service: Gynecology;  Laterality: Left;   LAPAROSCOPIC UNILATERAL SALPINGECTOMY Left 09/27/2020   Procedure: LAPAROSCOPIC UNILATERAL SALPINGECTOMY;  Surgeon: Nadara Mustard, MD;  Location: [facility];  Service: Gynecology;  Laterality: Left;   PERIPHERAL VASCULAR THROMBECTOMY Right 11/07/2021   Procedure: PERIPHERAL VASCULAR THROMBECTOMY;  Surgeon: Renford Dills, MD;  Location: [facility];  Service: Cardiovascular;  Laterality: Right;   WISDOM TOOTH EXTRACTION     Patient Active Problem List   Diagnosis Date Noted   Right leg pain 09/14/2021   Occipital headache 09/14/2021   Menorrhagia 09/27/2020   Left ovarian cyst 08/06/2018   Adnexal mass 07/02/2018   Fibroid 07/02/2018   Lymphedema 04/28/2018   Weakness of both  lower extremities 04/28/2018   Sleep disturbance 02/10/2018   Factor V Leiden (HCC) 02/11/2017   Lupus anticoagulant disorder (HCC) 02/11/2017   Shortness of breath 02/11/2017   DJD (degenerative joint disease) 02/11/2017   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 Butta presents to OT visit to address RLE lymphedema. Pt states pain is unchanged from initial evaluation on 7/24. She was able to apply compression wraps with extra time during visit interval. She states she wrapped less tightly and had less pain.   PERTINENT HISTORY: Relevant to lymphedema: chronic, R, iliac vein DVT: post phlebitic syndrome, has worn compression garments in the past, but they no longer fit (by report), sleep disturbance, RLS (by report), anxiety, hx skin Ca, obesity ( BMI = 51.4), Hx pulmonary thromboembolism, lupus  PAIN:  Are you having pain? Yes: NPRS scale: 6/10 Pain location: B thighs, B knees Pain description: burning, numbness, pins and needles, full, heavy, tired, weak, hot Aggravating factors: bad weather, standing, walking, dependent sitting Relieving factors: elevation, "lymphedema machine", being in water  PRECAUTIONS: Other: LYMPHEDEMA PRECAUTIONS  WEIGHT BEARING RESTRICTIONS: No  FALLS:  Has  patient fallen in last 6 months? No,  but reports frequent near misses- describes weakness in her knees w frequent LOB  LIVING ENVIRONMENT: Lives with:  5 y o daughter with special needs Lives in: House/apartment Stairs: Yes; External: y- 3 steps; can reach both Has following equipment at home: None  OCCUPATION: retired from [employer]  LEISURE: family  HAND DOMINANCE: right   PRIOR LEVEL OF FUNCTION: Independent  PATIENT GOALS: to get everything back like it was before this started."   PERTINENT HISTORY: Insidious onset of BLE lymphedema ; denies family hx. chronic, R, iliac vein DVT;  post phlebitic syndrome, has worn compression garments in the past, but they do not fit because I am so short; sleep disturbance, Lupus ,restless leg (by report),   OBJECTIVE: OBSERVATIONS / OTHER ASSESSMENTS:   FOTO functional outcome measure: Intake score: 41%  Lymphedema Life Impact Scale (LLIS): Intake Score: 83.82% (The extent to which LE-related problems impacted daily life in the last week)  COGNITION:Overall cognitive status: Within functional limits for tasks assessed   POSTURE: WNL  LE ROM: WFL  LE MMT:  WFL for tasks assessed  BLE COMPARATIVE LIMB VOLUMETRICS:    Initial 02/06/23 LANDMARK RIGHT  (dominant)  R LEG (A-D) 2491.6 ml  R THIGH (E-G) 4538.7 ml  R FULL LIMB (A-G) 7030.3 ml  Limb Volume differential (LVD)  LVD for LEG = 11.5 %, R>L; LVD THIGH = 2.2%, R>L; LVD FULL LIMB = 5.54%, R>L  Volume change since initial %  Volume change overall V  (Blank rows = not tested)  LANDMARK LEFT    L LEG (A-D) 2204.7 ml  L THIGH (E-G) 4435.7 ml  L  FULL LIMB (A-G) 6640.4 ml  Limb Volume differential (LVD)  %  Volume change since initial %  Volume change overall %  (Blank rows = not tested)   SKIN CONDITION: Mild, Stage  II, Bilateral Lower Extremity Lymphedema 2/2 suspected CVI and Obesity  TODAY'S TREATMENT:  RLE/RLQ MLD utilizing functional inguinal watershed R leg multilayer, knee length compression bandages from toes to popliteal fossa Pt edu for LE self care   PATIENT EDUCATION:  Education details: Continued lymphedema self-care training. Commenced teaching MLD Person educated: Patient Education method: Explanation, Demonstration, and Handouts Education comprehension: verbalized understanding, returned demonstration, and needs further education  HOME EXERCISE PROGRAM: 1.Therapeutic lymphatic pumping therex- 2 sets of 10 reps, hold 5 seconds; elements in order. 2 x daily PRN 2. During Intensive Phase CDT- multilayer short stretch compression wraps using gradient techniques. During Self-Management Phase, appropriate daytime compression garments and HOS device PRN.: Knee length wrap initially.  Custom-made gradient compression garments and HOS devices are medically necessary in this case because they are uniquely sized and shaped to fit the exact dimensions of the affected extremities with deformities, and to provide accurate and consistent gradient compression and containment, essential to optimally managing this patient's symptoms of chronic, progressive lymphedema. Multiple custom compression garments are needed for optimal hygiene to limit infection risk. Custom compression garments should be replaced q 3-6 months When worn consistently for optimal lipo-lymphedema self-management over time.  3. Daily skin care to sustain optimal hydration. Wash bites, scratches, blisters, etc with mild soap and water , apply antibacterial first aide cream and a band aide. 4. Simple self-Manual lymphatic drainage (MLD) PRN.  ASSESSMENT:  CLINICAL IMPRESSION: Pt continues to have some challenges tolerating compression wraps. She often removes wraps and leaves them off, which results in fluid recollection in the limb. Provided MLD today without  increased pain. Rea[pplied 3 layer wrap using gradient techniques. Cont as per POC.  OBJECTIVE IMPAIRMENTS: decreased balance, decreased knowledge of condition, decreased knowledge of use of DME, decreased mobility, difficulty walking, decreased strength, increased edema, impaired sensation, obesity, pain, and chronic, progressive, bilateral lower extremity swelling and associated pain .   ACTIVITY LIMITATIONS: carrying, lifting, bending, sitting, standing, squatting, sleeping, stairs, transfers, bed mobility, bathing, dressing, and fitting  LB clothing and  shoes, driving, shopping, cooking, meal prep, running errands, cleaning house  PERSONAL FACTORS: Past/current experiences, Time since onset of injury/illness/exacerbation, 3+ co morbidities,  body image, and are also affecting patient's functional outcome.   REHAB POTENTIAL: Good  EVALUATION COMPLEXITY: High   GOALS: Goals reviewed with patient? Yes  SHORT TERM GOALS: Target date: 4th OT Rx visit   Pt will demonstrate understanding of lymphedema precautions and prevention strategies with modified independence using a printed reference to identify at least 5 precautions and discussing how s/he may implement them into daily life to reduce risk of progression with extra time. Baseline:Max A Goal status: INITIAL  2.  Pt will be able to apply multilayer, knee length, gradient, compression wraps to one leg at a time with modified assistance (extra time and assistive device/s) to decrease limb volume, to limit infection risk, and to limit lymphedema progression.  Baseline: Dependent Goal status: INITIAL  LONG TERM GOALS: Target date: 04/29/23  Given this patient's Intake score of 41% on the functional outcomes FOTO tool, patient will experience an increase in function of 3 points to improve basic and instrumental ADLs performance, including lymphedema self-care.  Baseline: Max A Goal status: INITIAL  2.  Given this patient's Intake score  of 83.82 % on the Lymphedema Life Impact Scale (LLIS), patient will experience a reduction of at least 5 points in her perceived level of functional impairment resulting from lymphedema to improve functional performance and quality of life (QOL). Baseline: 83.82% Goal status: INITIAL  3.  Pt will  achieve at least a 10% volume reduction in B legs to return limb to typical size and shape, to limit infection risk and LE progression, to decrease pain, to improve function. Baseline: Dependent Goal status: INITIAL  4.  Pt will obtain proper compression garments/devices and achieve modified independence (extra time + assistive devices) with donning/doffing to optimize limb volume reductions and limit LE  progression over time. Baseline: Max A Goal status: INITIAL  5.  During Intensive phase CDT , with modified independence, Pt will achieve at least 85% compliance with all lymphedema self-care home program components, including daily skin care, compression wraps and /or garments, simple self MLD and lymphatic pumping therex to habituate LE self care protocol  into ADLs for optimal LE self-management over time. Baseline: Dependent Goal status: INITIAL  PLAN:  PT FREQUENCY: 1-2x/week  PT DURATION: 12 weeks  PLANNED INTERVENTIONS: Therapeutic exercises, Therapeutic activity, Neuromuscular re-education, Balance training, Gait training, Patient/Family education, Self Care, Joint mobilization, DME instructions, Manual lymph drainage, Compression bandaging, Taping, Manual therapy, and Re-evaluation, skin care, fitting with custom daytime and HOS compression, training for assistive devices  PLAN FOR NEXT SESSION:  Initial BLE comparative limb volumetrics Initial knee or thigh length  multilayer compression bandaging- RLE Pt edu re LE self-care   Loel Dubonnet, MS, OTR/L, CLT-LANA 02/20/23 2:11 PM

## 2023-02-21 ENCOUNTER — Telehealth (INDEPENDENT_AMBULATORY_CARE_PROVIDER_SITE_OTHER): Payer: Self-pay

## 2023-02-21 NOTE — Telephone Encounter (Signed)
Pt called and LVM for [place] to call her. I tried calling no answer left VM for her to call back to our office

## 2023-02-21 NOTE — Telephone Encounter (Signed)
I have reached out them and am awaiting a response

## 2023-02-21 NOTE — Telephone Encounter (Signed)
Yes I did instruct tactile to hold off on her measurements because I received notification from [employer] that she was receiving a pump from them.  You cannot receive the pump from 2 different people.  Therefore in order to avoid any issues with her insurance I asked [employer] to take a step back.  However if she is having issues about that we can ask tactile to step it again.

## 2023-02-21 NOTE — Telephone Encounter (Signed)
Please have tactile step in as that is what pt is wanting.

## 2023-02-22 ENCOUNTER — Ambulatory Visit: Payer: Medicaid Other | Admitting: Occupational Therapy

## 2023-02-22 ENCOUNTER — Encounter: Payer: Self-pay | Admitting: Occupational Therapy

## 2023-02-22 DIAGNOSIS — I890 Lymphedema, not elsewhere classified: Secondary | ICD-10-CM

## 2023-02-22 NOTE — Therapy (Signed)
OUTPATIENT OCCUPATIONAL THERAPY TREATMENT NOTE  LOWER EXTREMITY LYMPHEDEMA   Patient Name: Caroline Madden MRN: [id number] DOB:08-Aug-1980, 42 y.o., female Today's Date: 02/22/2023  END OF SESSION:   OT End of Session - 02/22/23 0805     Visit Number 6    Number of Visits 36    Date for OT Re-Evaluation 04/29/23    OT Start Time 0800    OT Stop Time 0855    OT Time Calculation (min) 55 min    Activity Tolerance Patient tolerated treatment well;No increased pain    Behavior During Therapy WFL for tasks assessed/performed               Past Medical History:  Diagnosis Date   Anxiety    NO MEDS   Cancer (HCC)    skin ca   DVT (deep venous thrombosis) (HCC) 2002   Factor V Leiden (HCC)    Headache    H/O MIGRAINES   History of kidney stones    Lymphedema    right leg-uses lymphedema pump   PE (pulmonary thromboembolism) (HCC)    Past Surgical History:  Procedure Laterality Date   CYST EXCISION  08/20/2019   Scalp   GANGLION CYST EXCISION     IR FALLOPIAN TUBE CATHETERIZATION N/A    removal per pt. of 1 fallopian tube   IVC FILTER INSERTION     LAPAROSCOPIC OVARIAN CYSTECTOMY Left 09/27/2020   Procedure: LAPAROSCOPIC OVARIAN CYSTECTOMY;  Surgeon: Nadara Mustard, MD;  Location: [facility];  Service: Gynecology;  Laterality: Left;   LAPAROSCOPIC UNILATERAL SALPINGECTOMY Left 09/27/2020   Procedure: LAPAROSCOPIC UNILATERAL SALPINGECTOMY;  Surgeon: Nadara Mustard, MD;  Location: [facility];  Service: Gynecology;  Laterality: Left;   PERIPHERAL VASCULAR THROMBECTOMY Right 11/07/2021   Procedure: PERIPHERAL VASCULAR THROMBECTOMY;  Surgeon: Renford Dills, MD;  Location: [facility];  Service: Cardiovascular;  Laterality: Right;   WISDOM TOOTH EXTRACTION     Patient Active Problem List   Diagnosis Date Noted   Right leg pain 09/14/2021   Occipital headache 09/14/2021   Menorrhagia 09/27/2020   Left ovarian cyst 08/06/2018   Adnexal mass 07/02/2018    Fibroid 07/02/2018   Lymphedema 04/28/2018   Weakness of both lower extremities 04/28/2018   Sleep disturbance 02/10/2018   Factor V Leiden (HCC) 02/11/2017   Lupus anticoagulant disorder (HCC) 02/11/2017   Shortness of breath 02/11/2017   DJD (degenerative joint disease) 02/11/2017   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 Pt states pain is unchanged from initial evaluation on 7/24. She was able to apply compression wraps with extra time during visit interval. She states she wrapped less tightly and had less pain.   PERTINENT HISTORY: Relevant to lymphedema: chronic, R, iliac vein DVT: post phlebitic syndrome, has worn compression garments in the past, but they no longer fit (by report), sleep disturbance, RLS (by report), anxiety, hx skin Ca, obesity ( BMI = 51.4), Hx pulmonary thromboembolism, lupus  PAIN:  Are you having pain? Yes: NPRS scale: 6/10 Pain location: B thighs, B knees Pain description: burning, numbness, pins and needles, full, heavy, tired, weak, hot Aggravating factors: bad weather, standing, walking, dependent sitting Relieving factors: elevation, "lymphedema machine", being in water  PRECAUTIONS: Other: LYMPHEDEMA  PRECAUTIONS  WEIGHT BEARING RESTRICTIONS: No  FALLS:  Has patient fallen in last 6 months? No,  but reports frequent near misses- describes weakness in her knees w frequent LOB  LIVING ENVIRONMENT: Lives with:  39 y o daughter with special needs Lives in: House/apartment Stairs: Yes; External: y- 3 steps; can reach both Has following equipment at home: None  OCCUPATION: retired from [employer]  LEISURE: family  HAND DOMINANCE: right   PRIOR LEVEL OF FUNCTION: Independent  PATIENT GOALS: to get everything back like it was before this started."   PERTINENT HISTORY: Insidious onset of BLE lymphedema ; denies family hx. chronic, R, iliac vein DVT;  post phlebitic syndrome, has worn compression garments in the past, but they do not fit because I am so short; sleep disturbance, Lupus ,restless leg (by report),   OBJECTIVE: OBSERVATIONS / OTHER ASSESSMENTS:   FOTO functional outcome measure: Intake score: 41%  Lymphedema Life Impact Scale (LLIS): Intake Score: 83.82% (The extent to which LE-related problems impacted daily life in the last week)  COGNITION:Overall cognitive status: Within functional limits for tasks assessed   POSTURE: WNL  LE ROM: WFL  LE MMT:  WFL for tasks assessed  BLE COMPARATIVE LIMB VOLUMETRICS:    Initial 02/06/23 LANDMARK RIGHT  (dominant)  R LEG (A-D) 2491.6 ml  R THIGH (E-G) 4538.7 ml  R FULL LIMB (A-G) 7030.3 ml  Limb Volume differential (LVD)  LVD for LEG = 11.5 %, R>L; LVD THIGH = 2.2%, R>L; LVD FULL LIMB = 5.54%, R>L  Volume change since initial %  Volume change overall V  (Blank rows = not tested)  LANDMARK LEFT    L LEG (A-D) 2204.7 ml  L THIGH (E-G) 4435.7 ml  L  FULL LIMB (A-G) 6640.4 ml  Limb Volume differential (LVD)  %  Volume change since initial %  Volume change overall %  (Blank rows = not tested)   SKIN CONDITION: Mild, Stage  II, Bilateral Lower Extremity Lymphedema 2/2 suspected CVI and Obesity  TODAY'S TREATMENT:  RLE/RLQ MLD utilizing functional inguinal watershed R leg multilayer, knee length compression bandages from toes to popliteal fossa Pt edu for LE self care   PATIENT EDUCATION:  Education details: Continued lymphedema self-care training. Commenced teaching MLD Person educated: Patient Education method: Explanation, Demonstration, and Handouts Education comprehension: verbalized understanding, returned demonstration, and needs further education  HOME EXERCISE PROGRAM: 1.Therapeutic lymphatic pumping therex- 2 sets of 10 reps, hold 5 seconds; elements in order. 2 x daily PRN 2. During Intensive Phase CDT- multilayer short stretch compression wraps using gradient techniques. During Self-Management Phase, appropriate daytime compression garments and HOS device PRN.: Knee length wrap initially.  Custom-made gradient compression garments and HOS devices are medically necessary in this case because they are uniquely sized and shaped to fit the exact dimensions of the affected extremities with deformities, and to provide accurate and consistent gradient compression and containment, essential to optimally managing this patient's symptoms of chronic, progressive lymphedema. Multiple custom compression garments are needed for optimal hygiene to limit infection risk. Custom compression garments should be replaced q 3-6 months When worn consistently for optimal lipo-lymphedema self-management over time.  3. Daily skin care to sustain optimal hydration. Wash bites, scratches, blisters, etc with mild soap and water , apply antibacterial first aide cream and a band aide. 4. Simple self-Manual lymphatic drainage (MLD) PRN.  ASSESSMENT:  CLINICAL IMPRESSION: Pt tolerated CDT today without increased pain. The RLE is responding well to therapy. Leg volume is visibly and palpably  reduced. Next visit we'll wrap from base of toes to groin. We continued with knee length wrapping today. Cont as per POC  OBJECTIVE IMPAIRMENTS: decreased balance, decreased knowledge of condition, decreased knowledge of use of DME, decreased mobility, difficulty walking, decreased strength, increased edema, impaired sensation, obesity, pain, and chronic, progressive, bilateral lower extremity swelling and associated pain .   ACTIVITY LIMITATIONS: carrying, lifting, bending, sitting, standing, squatting, sleeping, stairs, transfers, bed mobility, bathing, dressing, and fitting  LB clothing and  shoes, driving, shopping, cooking, meal prep, running errands, cleaning house  PERSONAL FACTORS: Past/current experiences, Time since onset of injury/illness/exacerbation, 3+ co morbidities,  body image, and are also affecting patient's functional outcome.   REHAB POTENTIAL: Good  EVALUATION COMPLEXITY: High   GOALS: Goals reviewed with patient? Yes  SHORT TERM GOALS: Target date: 4th OT Rx visit   Pt will demonstrate understanding of lymphedema precautions and prevention strategies with modified independence using a printed reference to identify at least 5 precautions and discussing how s/he may implement them into daily life to reduce risk of progression with extra time. Baseline:Max A Goal status: INITIAL  2.  Pt will be able to apply multilayer, knee length, gradient, compression wraps to one leg at a time with modified assistance (extra time and assistive device/s) to decrease limb volume, to limit infection risk, and to limit lymphedema progression.  Baseline: Dependent Goal status: INITIAL  LONG TERM GOALS: Target date: 04/29/23  Given this patient's Intake score of 41% on the functional outcomes FOTO tool, patient will experience an increase in function of 3 points to improve basic and instrumental ADLs performance, including lymphedema self-care.  Baseline: Max A Goal status:  INITIAL  2.  Given this patient's Intake score of 83.82 % on the Lymphedema Life Impact Scale (LLIS), patient will experience a reduction of at least 5 points in her perceived level of functional impairment resulting from lymphedema to improve functional performance and quality of life (QOL). Baseline: 83.82% Goal status: INITIAL  3.  Pt will  achieve at least a 10% volume reduction in B legs to return limb to typical size and shape, to limit infection risk and LE progression, to decrease pain, to improve function. Baseline: Dependent Goal status: INITIAL  4.  Pt will obtain proper compression garments/devices and achieve modified independence (extra time + assistive devices) with donning/doffing to optimize limb volume reductions and limit LE  progression over time. Baseline: Max A Goal status: INITIAL  5.  During Intensive phase CDT , with modified independence, Pt will achieve at least 85% compliance with all lymphedema self-care home program components, including daily skin care, compression wraps and /or garments, simple self MLD and lymphatic pumping therex to habituate LE self care protocol  into ADLs for optimal LE self-management over time. Baseline: Dependent Goal status: INITIAL  PLAN:  PT FREQUENCY: 1-2x/week  PT DURATION: 12 weeks  PLANNED INTERVENTIONS: Therapeutic exercises, Therapeutic activity, Neuromuscular re-education, Balance training, Gait training, Patient/Family education, Self Care, Joint mobilization, DME instructions, Manual lymph drainage, Compression bandaging, Taping, Manual therapy, and Re-evaluation, skin care, fitting with custom daytime and HOS compression, training for assistive devices  PLAN FOR NEXT SESSION:  Initial BLE comparative limb volumetrics Initial knee or thigh length  multilayer compression bandaging- RLE Pt edu re LE self-care   Loel Dubonnet, MS, OTR/L, CLT-LANA 02/22/23 8:57 AM

## 2023-02-25 ENCOUNTER — Ambulatory Visit: Payer: Medicaid Other | Admitting: Occupational Therapy

## 2023-02-25 DIAGNOSIS — I890 Lymphedema, not elsewhere classified: Secondary | ICD-10-CM | POA: Diagnosis not present

## 2023-02-25 NOTE — Therapy (Signed)
OUTPATIENT OCCUPATIONAL THERAPY TREATMENT NOTE  LOWER EXTREMITY LYMPHEDEMA   Patient Name: Caroline Madden MRN: [id number] DOB:1981-06-25, 42 y.o., female Today's Date: 02/25/2023  END OF SESSION:   OT End of Session - 02/25/23 1407     Visit Number 7    Number of Visits 36    Date for OT Re-Evaluation 04/29/23    OT Start Time 0203    OT Stop Time 0303    OT Time Calculation (min) 60 min    Activity Tolerance Patient tolerated treatment well;No increased pain    Behavior During Therapy WFL for tasks assessed/performed               Past Medical History:  Diagnosis Date   Anxiety    NO MEDS   Cancer (HCC)    skin ca   DVT (deep venous thrombosis) (HCC) 2002   Factor V Leiden (HCC)    Headache    H/O MIGRAINES   History of kidney stones    Lymphedema    right leg-uses lymphedema pump   PE (pulmonary thromboembolism) (HCC)    Past Surgical History:  Procedure Laterality Date   CYST EXCISION  08/20/2019   Scalp   GANGLION CYST EXCISION     IR FALLOPIAN TUBE CATHETERIZATION N/A    removal per pt. of 1 fallopian tube   IVC FILTER INSERTION     LAPAROSCOPIC OVARIAN CYSTECTOMY Left 09/27/2020   Procedure: LAPAROSCOPIC OVARIAN CYSTECTOMY;  Surgeon: Nadara Mustard, MD;  Location: [facility];  Service: Gynecology;  Laterality: Left;   LAPAROSCOPIC UNILATERAL SALPINGECTOMY Left 09/27/2020   Procedure: LAPAROSCOPIC UNILATERAL SALPINGECTOMY;  Surgeon: Nadara Mustard, MD;  Location: [facility];  Service: Gynecology;  Laterality: Left;   PERIPHERAL VASCULAR THROMBECTOMY Right 11/07/2021   Procedure: PERIPHERAL VASCULAR THROMBECTOMY;  Surgeon: Renford Dills, MD;  Location: [facility];  Service: Cardiovascular;  Laterality: Right;   WISDOM TOOTH EXTRACTION     Patient Active Problem List   Diagnosis Date Noted   Right leg pain 09/14/2021   Occipital headache 09/14/2021   Menorrhagia 09/27/2020   Left ovarian cyst 08/06/2018   Adnexal mass 07/02/2018    Fibroid 07/02/2018   Lymphedema 04/28/2018   Weakness of both lower extremities 04/28/2018   Sleep disturbance 02/10/2018   Factor V Leiden (HCC) 02/11/2017   Lupus anticoagulant disorder (HCC) 02/11/2017   Shortness of breath 02/11/2017   DJD (degenerative joint disease) 02/11/2017   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 presents to OT visit to address RLE lymphedema. Pt states pain is unchanged from initial evaluation on 7/24. She was able to apply compression wraps with extra time during visit interval. She states she wrapped less tightly and had less pain.   PERTINENT HISTORY: Relevant to lymphedema: chronic, R, iliac vein DVT: post phlebitic syndrome, has worn compression garments in the past, but they no longer fit (by report), sleep disturbance, RLS (by report), anxiety, hx skin Ca, obesity ( BMI = 51.4), Hx pulmonary thromboembolism, lupus  PAIN:  Are you having pain? Yes: NPRS scale: 6/10 Pain location: B thighs, B knees Pain description: burning, numbness, pins and needles, full, heavy, tired, weak, hot Aggravating factors: bad weather, standing, walking, dependent sitting Relieving factors: elevation, "lymphedema machine", being in water  PRECAUTIONS: Other: LYMPHEDEMA  PRECAUTIONS  WEIGHT BEARING RESTRICTIONS: No  FALLS:  Has patient fallen in last 6 months? No,  but reports frequent near misses- describes weakness in her knees w frequent LOB  LIVING ENVIRONMENT: Lives with:  16 Y o daughter with special needs Lives in: House/apartment Stairs: Yes; External: y- 3 steps; can reach both Has following equipment at home: None  OCCUPATION: retired from [employer]  LEISURE: family  HAND DOMINANCE: right   PRIOR LEVEL OF FUNCTION: Independent  PATIENT GOALS: to get everything back like it was before this started."   PERTINENT HISTORY: Insidious onset of BLE lymphedema ; denies family hx. chronic, R, iliac vein DVT;  post phlebitic syndrome, has worn compression garments in the past, but they do not fit because I am so short; sleep disturbance, Lupus ,restless leg (by report),   OBJECTIVE: OBSERVATIONS / OTHER ASSESSMENTS:   FOTO functional outcome measure: Intake score: 41%  Lymphedema Life Impact Scale (LLIS): Intake Score: 83.82% (The extent to which LE-related problems impacted daily life in the last week)  COGNITION:Overall cognitive status: Within functional limits for tasks assessed   POSTURE: WNL  LE ROM: WFL  LE MMT:  WFL for tasks assessed  BLE COMPARATIVE LIMB VOLUMETRICS:    Initial 02/06/23 LANDMARK RIGHT  (dominant)  R LEG (A-D) 2491.6 ml  R THIGH (E-G) 4538.7 ml  R FULL LIMB (A-G) 7030.3 ml  Limb Volume differential (LVD)  LVD for LEG = 11.5 %, R>L; LVD THIGH = 2.2%, R>L; LVD FULL LIMB = 5.54%, R>L  Volume change since initial %  Volume change overall V  (Blank rows = not tested)  LANDMARK LEFT    L LEG (A-D) 2204.7 ml  L THIGH (E-G) 4435.7 ml  L  FULL LIMB (A-G) 6640.4 ml  Limb Volume differential (LVD)  %  Volume change since initial %  Volume change overall %  (Blank rows = not tested)   SKIN CONDITION: Mild, Stage  II, Bilateral Lower Extremity Lymphedema 2/2 suspected CVI and Obesity  TODAY'S TREATMENT:  RLE/RLQ MLD utilizing functional inguinal watershed R leg multilayer, knee length compression bandages from toes to popliteal fossa Pt edu for LE self care   PATIENT EDUCATION:  Education details: Continued lymphedema self-care training. Commenced teaching MLD Person educated: Patient Education method: Explanation, Demonstration, and Handouts Education comprehension: verbalized understanding, returned demonstration, and needs further education  HOME EXERCISE PROGRAM: 1.Therapeutic lymphatic pumping therex- 2 sets of 10 reps, hold 5 seconds; elements in order. 2 x daily PRN 2. During Intensive Phase CDT- multilayer short stretch compression wraps using gradient techniques. During Self-Management Phase, appropriate daytime compression garments and HOS device PRN.: Knee length wrap initially.  Custom-made gradient compression garments and HOS devices are medically necessary in this case because they are uniquely sized and shaped to fit the exact dimensions of the affected extremities with deformities, and to provide accurate and consistent gradient compression and containment, essential to optimally managing this patient's symptoms of chronic, progressive lymphedema. Multiple custom compression garments are needed for optimal hygiene to limit infection risk. Custom compression garments should be replaced q 3-6 months When worn consistently for optimal lipo-lymphedema self-management over time.  3. Daily skin care to sustain optimal hydration. Wash bites, scratches, blisters, etc with mild soap and water , apply antibacterial first aide cream and a band aide. 4. Simple self-Manual lymphatic drainage (MLD) PRN.  ASSESSMENT:  CLINICAL IMPRESSION: Continued MLD to RLE/ RLQ with simultaneous skin care to reduce infection risk. Extended multilayer compression wraps to groin  by adding 1 each 12 and 15 cm short stretch bandage over Rosidal foam. Pt educated on extending wraps during application. Cont as per POC. Steady progress towards all goals.  OBJECTIVE IMPAIRMENTS: decreased balance, decreased knowledge of condition, decreased knowledge of use of DME, decreased mobility, difficulty walking, decreased strength, increased edema, impaired sensation, obesity, pain, and chronic, progressive, bilateral lower extremity swelling and associated pain .   ACTIVITY LIMITATIONS: carrying, lifting, bending, sitting, standing, squatting, sleeping, stairs, transfers, bed mobility, bathing, dressing, and fitting  LB clothing and  shoes, driving, shopping, cooking, meal prep, running errands, cleaning house  PERSONAL FACTORS: Past/current experiences, Time since onset of injury/illness/exacerbation, 3+ co morbidities,  body image, and are also affecting patient's functional outcome.   REHAB POTENTIAL: Good  EVALUATION COMPLEXITY: High   GOALS: Goals reviewed with patient? Yes  SHORT TERM GOALS: Target date: 4th OT Rx visit   Pt will demonstrate understanding of lymphedema precautions and prevention strategies with modified independence using a printed reference to identify at least 5 precautions and discussing how s/he may implement them into daily life to reduce risk of progression with extra time. Baseline:Max A Goal status: INITIAL  2.  Pt will be able to apply multilayer, knee length, gradient, compression wraps to one leg at a time with modified assistance (extra time and assistive device/s) to decrease limb volume, to limit infection risk, and to limit lymphedema progression.  Baseline: Dependent Goal status: INITIAL  LONG TERM GOALS: Target date: 04/29/23  Given this patient's Intake score of 41% on the functional outcomes FOTO tool, patient will experience an increase in function of 3 points to improve basic and instrumental ADLs performance, including lymphedema  self-care.  Baseline: Max A Goal status: INITIAL  2.  Given this patient's Intake score of 83.82 % on the Lymphedema Life Impact Scale (LLIS), patient will experience a reduction of at least 5 points in her perceived level of functional impairment resulting from lymphedema to improve functional performance and quality of life (QOL). Baseline: 83.82% Goal  status: INITIAL  3.  Pt will achieve at least a 10% volume reduction in B legs to return limb to typical size and shape, to limit infection risk and LE progression, to decrease pain, to improve function. Baseline: Dependent Goal status: INITIAL  4.  Pt will obtain proper compression garments/devices and achieve modified independence (extra time + assistive devices) with donning/doffing to optimize limb volume reductions and limit LE  progression over time. Baseline: Max A Goal status: INITIAL  5.  During Intensive phase CDT , with modified independence, Pt will achieve at least 85% compliance with all lymphedema self-care home program components, including daily skin care, compression wraps and /or garments, simple self MLD and lymphatic pumping therex to habituate LE self care protocol  into ADLs for optimal LE self-management over time. Baseline: Dependent Goal status: INITIAL  PLAN:  PT FREQUENCY: 1-2x/week  PT DURATION: 12 weeks  PLANNED INTERVENTIONS: Therapeutic exercises, Therapeutic activity, Neuromuscular re-education, Balance training, Gait training, Patient/Family education, Self Care, Joint mobilization, DME instructions, Manual lymph drainage, Compression bandaging, Taping, Manual therapy, and Re-evaluation, skin care, fitting with custom daytime and HOS compression, training for assistive devices  PLAN FOR NEXT SESSION:  Initial BLE comparative limb volumetrics Initial knee or thigh length  multilayer compression bandaging- RLE Pt edu re LE self-care   Loel Dubonnet, MS, OTR/L, CLT-LANA 02/25/23 4:00 PM

## 2023-02-27 ENCOUNTER — Ambulatory Visit: Payer: Medicaid Other | Admitting: Occupational Therapy

## 2023-02-27 DIAGNOSIS — I890 Lymphedema, not elsewhere classified: Secondary | ICD-10-CM | POA: Diagnosis not present

## 2023-02-27 NOTE — Therapy (Signed)
OUTPATIENT OCCUPATIONAL THERAPY TREATMENT NOTE  LOWER EXTREMITY LYMPHEDEMA   Patient Name: Caroline Madden MRN: [id number] DOB:1980-12-12, 42 y.o., female Today's Date: 02/27/2023  END OF SESSION:   OT End of Session - 02/27/23 1308     Visit Number 8    Number of Visits 36    Date for OT Re-Evaluation 04/29/23    OT Start Time 0103    OT Stop Time 0206    OT Time Calculation (min) 63 min    Activity Tolerance Patient tolerated treatment well;No increased pain    Behavior During Therapy WFL for tasks assessed/performed               Past Medical History:  Diagnosis Date   Anxiety    NO MEDS   Cancer (HCC)    skin ca   DVT (deep venous thrombosis) (HCC) 2002   Factor V Leiden (HCC)    Headache    H/O MIGRAINES   History of kidney stones    Lymphedema    right leg-uses lymphedema pump   PE (pulmonary thromboembolism) (HCC)    Past Surgical History:  Procedure Laterality Date   CYST EXCISION  08/20/2019   Scalp   GANGLION CYST EXCISION     IR FALLOPIAN TUBE CATHETERIZATION N/A    removal per pt. of 1 fallopian tube   IVC FILTER INSERTION     LAPAROSCOPIC OVARIAN CYSTECTOMY Left 09/27/2020   Procedure: LAPAROSCOPIC OVARIAN CYSTECTOMY;  Surgeon: Caroline Mustard, MD;  Location: [facility];  Service: Gynecology;  Laterality: Left;   LAPAROSCOPIC UNILATERAL SALPINGECTOMY Left 09/27/2020   Procedure: LAPAROSCOPIC UNILATERAL SALPINGECTOMY;  Surgeon: Caroline Mustard, MD;  Location: [facility];  Service: Gynecology;  Laterality: Left;   PERIPHERAL VASCULAR THROMBECTOMY Right 11/07/2021   Procedure: PERIPHERAL VASCULAR THROMBECTOMY;  Surgeon: Caroline Dills, MD;  Location: [facility];  Service: Cardiovascular;  Laterality: Right;   WISDOM TOOTH EXTRACTION     Patient Active Problem List   Diagnosis Date Noted   Right leg pain 09/14/2021   Occipital headache 09/14/2021   Menorrhagia 09/27/2020   Left ovarian cyst 08/06/2018   Adnexal mass 07/02/2018    Fibroid 07/02/2018   Lymphedema 04/28/2018   Weakness of both lower extremities 04/28/2018   Sleep disturbance 02/10/2018   Factor V Leiden (HCC) 02/11/2017   Lupus anticoagulant disorder (HCC) 02/11/2017   Shortness of breath 02/11/2017   DJD (degenerative joint disease) 02/11/2017   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 Pt states pain is unchanged from initial evaluation on 7/24. She was able to apply compression wraps with extra time during visit interval. She states she wrapped less tightly and had less pain.   PERTINENT HISTORY: Relevant to lymphedema: chronic, R, iliac vein DVT: post phlebitic syndrome, has worn compression garments in the past, but they no longer fit (by report), sleep disturbance, RLS (by report), anxiety, hx skin Ca, obesity ( BMI = 51.4), Hx pulmonary thromboembolism, lupus  PAIN:  Are you having pain? Yes: NPRS scale: 6/10 Pain location: B thighs, B knees Pain description: burning, numbness, pins and needles, full, heavy, tired, weak, hot Aggravating factors: bad weather, standing, walking, dependent sitting Relieving factors: elevation, "lymphedema machine", being in water  PRECAUTIONS: Other: LYMPHEDEMA  PRECAUTIONS  WEIGHT BEARING RESTRICTIONS: No  FALLS:  Has patient fallen in last 6 months? No,  but reports frequent near misses- describes weakness in her knees w frequent LOB  LIVING ENVIRONMENT: Lives with:  64 Y o daughter with special needs Lives in: House/apartment Stairs: Yes; External: y- 3 steps; can reach both Has following equipment at home: None  OCCUPATION: retired from [employer]  LEISURE: family  HAND DOMINANCE: right   PRIOR LEVEL OF FUNCTION: Independent  PATIENT GOALS: to get everything back like it was before this started."   PERTINENT HISTORY: Insidious onset of BLE lymphedema ; denies family hx. chronic, R, iliac vein DVT;  post phlebitic syndrome, has worn compression garments in the past, but they do not fit because I am so short; sleep disturbance, Lupus ,restless leg (by report),   OBJECTIVE: OBSERVATIONS / OTHER ASSESSMENTS:   FOTO functional outcome measure: Intake score: 41%  Lymphedema Life Impact Scale (LLIS): Intake Score: 83.82% (The extent to which LE-related problems impacted daily life in the last week)  COGNITION:Overall cognitive status: Within functional limits for tasks assessed   POSTURE: WNL  LE ROM: WFL  LE MMT:  WFL for tasks assessed  BLE COMPARATIVE LIMB VOLUMETRICS:    Initial 02/06/23 LANDMARK RIGHT  (dominant)  R LEG (A-D) 2491.6 ml  R THIGH (E-G) 4538.7 ml  R FULL LIMB (A-G) 7030.3 ml  Limb Volume differential (LVD)  LVD for LEG = 11.5 %, R>L; LVD THIGH = 2.2%, R>L; LVD FULL LIMB = 5.54%, R>L  Volume change since initial %  Volume change overall V  (Blank rows = not tested)  LANDMARK LEFT    L LEG (A-D) 2204.7 ml  L THIGH (E-G) 4435.7 ml  L  FULL LIMB (A-G) 6640.4 ml  Limb Volume differential (LVD)  %  Volume change since initial %  Volume change overall %  (Blank rows = not tested)   SKIN CONDITION: Mild, Stage  II, Bilateral Lower Extremity Lymphedema 2/2 suspected CVI and Obesity  TODAY'S TREATMENT:  RLE/RLQ MLD utilizing functional inguinal watershed R leg multilayer, thigh length compression bandages  Pt edu for LE self care   PATIENT EDUCATION:  Education details: Continued lymphedema self-care training. Commenced teaching MLD Person educated: Patient Education method: Explanation, Demonstration, and Handouts Education comprehension: verbalized understanding, returned demonstration, and needs further education  HOME EXERCISE PROGRAM: 1.Therapeutic lymphatic pumping therex- 2 sets of 10 reps, hold 5 seconds; elements in order. 2 x daily PRN 2. During Intensive Phase CDT- multilayer short stretch compression wraps using gradient techniques. During Self-Management Phase, appropriate daytime compression garments and HOS device PRN.: Knee length wrap initially.  Custom-made gradient compression garments and HOS devices are medically necessary in this case because they are uniquely sized and shaped to fit the exact dimensions of the affected extremities with deformities, and to provide accurate and consistent gradient compression and containment, essential to optimally managing this patient's symptoms of chronic, progressive lymphedema. Multiple custom compression garments are needed for optimal hygiene to limit infection risk. Custom compression garments should be replaced q 3-6 months When worn consistently for optimal lipo-lymphedema self-management over time.  3. Daily skin care to sustain optimal hydration. Wash bites, scratches, blisters, etc with mild soap and water , apply antibacterial first aide cream and a band aide. 4. Simple self-Manual lymphatic drainage (MLD) PRN.  ASSESSMENT:  CLINICAL IMPRESSION: Continued MLD to RLE/ RLQ with simultaneous skin care to reduce infection risk. Extended multilayer compression wraps to groin by adding 1 each 12 and 15  cm short stretch bandage over Rosidal foam. Pt educated on extending wraps during application. Cont as per POC. Steady progress towards all goals.  OBJECTIVE IMPAIRMENTS: decreased balance, decreased knowledge of condition, decreased knowledge of use of DME, decreased mobility, difficulty walking, decreased strength, increased edema, impaired sensation, obesity, pain, and chronic, progressive, bilateral lower extremity swelling and associated pain .   ACTIVITY LIMITATIONS: carrying, lifting, bending, sitting, standing, squatting, sleeping, stairs, transfers, bed mobility, bathing, dressing, and fitting  LB clothing and  shoes, driving, shopping, cooking, meal prep, running errands, cleaning house  PERSONAL FACTORS: Past/current experiences, Time since onset of injury/illness/exacerbation, 3+ co morbidities,  body image, and are also affecting patient's functional outcome.   REHAB POTENTIAL: Good  EVALUATION COMPLEXITY: High   GOALS: Goals reviewed with patient? Yes  SHORT TERM GOALS: Target date: 4th OT Rx visit   Pt will demonstrate understanding of lymphedema precautions and prevention strategies with modified independence using a printed reference to identify at least 5 precautions and discussing how s/he may implement them into daily life to reduce risk of progression with extra time. Baseline:Max A Goal status: INITIAL  2.  Pt will be able to apply multilayer, knee length, gradient, compression wraps to one leg at a time with modified assistance (extra time and assistive device/s) to decrease limb volume, to limit infection risk, and to limit lymphedema progression.  Baseline: Dependent Goal status: INITIAL  LONG TERM GOALS: Target date: 04/29/23  Given this patient's Intake score of 41% on the functional outcomes FOTO tool, patient will experience an increase in function of 3 points to improve basic and instrumental ADLs performance, including lymphedema self-care.  Baseline: Max  A Goal status: INITIAL  2.  Given this patient's Intake score of 83.82 % on the Lymphedema Life Impact Scale (LLIS), patient will experience a reduction of at least 5 points in her perceived level of functional impairment resulting from lymphedema to improve functional performance and quality of life (QOL). Baseline: 83.82% Goal status: INITIAL  3.  Pt will achieve at least a 10% volume reduction in B legs to return limb to typical size and shape, to limit infection risk and LE progression, to decrease pain, to improve function. Baseline: Dependent Goal status: INITIAL  4.  Pt will obtain proper compression garments/devices and achieve modified independence (extra time + assistive devices) with donning/doffing to optimize limb volume reductions and limit LE  progression over time. Baseline: Max A Goal status: INITIAL  5.  During Intensive phase CDT , with modified independence, Pt will achieve at least 85% compliance with all lymphedema self-care home program components, including daily skin care, compression wraps and /or garments, simple self MLD and lymphatic pumping therex to habituate LE self care protocol  into ADLs for optimal LE self-management over time. Baseline: Dependent Goal status: INITIAL  PLAN:  PT FREQUENCY: 1-2x/week  PT DURATION: 12 weeks  PLANNED INTERVENTIONS: Therapeutic exercises, Therapeutic activity, Neuromuscular re-education, Balance training, Gait training, Patient/Family education, Self Care, Joint mobilization, DME instructions, Manual lymph drainage, Compression bandaging, Taping, Manual therapy, and Re-evaluation, skin care, fitting with custom daytime and HOS compression, training for assistive devices  PLAN FOR NEXT SESSION:  Initial BLE comparative limb volumetrics Initial knee or thigh length  multilayer compression bandaging- RLE Pt edu re LE self-care   Loel Dubonnet, MS, OTR/L, CLT-LANA 02/27/23 3:05 PM

## 2023-02-28 ENCOUNTER — Encounter: Payer: Medicaid Other | Admitting: Occupational Therapy

## 2023-03-04 ENCOUNTER — Encounter: Payer: Medicaid Other | Admitting: Occupational Therapy

## 2023-03-04 ENCOUNTER — Ambulatory Visit: Payer: Medicaid Other | Admitting: Occupational Therapy

## 2023-03-04 DIAGNOSIS — I890 Lymphedema, not elsewhere classified: Secondary | ICD-10-CM

## 2023-03-05 NOTE — Therapy (Signed)
OUTPATIENT OCCUPATIONAL THERAPY TREATMENT NOTE  LOWER EXTREMITY LYMPHEDEMA   Patient Name: Caroline Madden MRN: [id number] DOB:10/20/1980, 42 y.o., female Today's Date: 03/05/2023  END OF SESSION:   OT End of Session - 03/04/23 0756     Visit Number 9    Number of Visits 36    Date for OT Re-Evaluation 04/29/23    OT Start Time 0305    OT Stop Time 0410    OT Time Calculation (min) 65 min    Activity Tolerance Patient tolerated treatment well;No increased pain    Behavior During Therapy WFL for tasks assessed/performed               Past Medical History:  Diagnosis Date   Anxiety    NO MEDS   Cancer (HCC)    skin ca   DVT (deep venous thrombosis) (HCC) 2002   Factor V Leiden (HCC)    Headache    H/O MIGRAINES   History of kidney stones    Lymphedema    right leg-uses lymphedema pump   PE (pulmonary thromboembolism) (HCC)    Past Surgical History:  Procedure Laterality Date   CYST EXCISION  08/20/2019   Scalp   GANGLION CYST EXCISION     IR FALLOPIAN TUBE CATHETERIZATION N/A    removal per pt. of 1 fallopian tube   IVC FILTER INSERTION     LAPAROSCOPIC OVARIAN CYSTECTOMY Left 09/27/2020   Procedure: LAPAROSCOPIC OVARIAN CYSTECTOMY;  Surgeon: Nadara Mustard, MD;  Location: [facility];  Service: Gynecology;  Laterality: Left;   LAPAROSCOPIC UNILATERAL SALPINGECTOMY Left 09/27/2020   Procedure: LAPAROSCOPIC UNILATERAL SALPINGECTOMY;  Surgeon: Nadara Mustard, MD;  Location: [facility];  Service: Gynecology;  Laterality: Left;   PERIPHERAL VASCULAR THROMBECTOMY Right 11/07/2021   Procedure: PERIPHERAL VASCULAR THROMBECTOMY;  Surgeon: Renford Dills, MD;  Location: [facility];  Service: Cardiovascular;  Laterality: Right;   WISDOM TOOTH EXTRACTION     Patient Active Problem List   Diagnosis Date Noted   Right leg pain 09/14/2021   Occipital headache 09/14/2021   Menorrhagia 09/27/2020   Left ovarian cyst 08/06/2018   Adnexal mass 07/02/2018    Fibroid 07/02/2018   Lymphedema 04/28/2018   Weakness of both lower extremities 04/28/2018   Sleep disturbance 02/10/2018   Factor V Leiden (HCC) 02/11/2017   Lupus anticoagulant disorder (HCC) 02/11/2017   Shortness of breath 02/11/2017   DJD (degenerative joint disease) 02/11/2017   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 Simeon presents to OT visit to address RLE lymphedema. Pt states pain is unchanged from initial evaluation on 7/24. She was able to apply compression wraps with extra time during visit interval. She states she wrapped less tightly and had less pain.   PERTINENT HISTORY: Relevant to lymphedema: chronic, R, iliac vein DVT: post phlebitic syndrome, has worn compression garments in the past, but they no longer fit (by report), sleep disturbance, RLS (by report), anxiety, hx skin Ca, obesity ( BMI = 51.4), Hx pulmonary thromboembolism, lupus  PAIN:  Are you having pain? Yes: NPRS scale: 6/10 Pain location: B thighs, B knees Pain description: burning, numbness, pins and needles, full, heavy, tired, weak, hot Aggravating factors: bad weather, standing, walking, dependent sitting Relieving factors: elevation, "lymphedema machine", being in water  PRECAUTIONS: Other: LYMPHEDEMA  PRECAUTIONS  WEIGHT BEARING RESTRICTIONS: No  FALLS:  Has patient fallen in last 6 months? No,  but reports frequent near misses- describes weakness in her knees w frequent LOB  LIVING ENVIRONMENT: Lives with:  72 Y o daughter with special needs Lives in: House/apartment Stairs: Yes; External: y- 3 steps; can reach both Has following equipment at home: None  OCCUPATION: retired from [employer]  LEISURE: family  HAND DOMINANCE: right   PRIOR LEVEL OF FUNCTION: Independent  PATIENT GOALS: to get everything back like it was before this started."   PERTINENT HISTORY: Insidious onset of BLE lymphedema ; denies family hx. chronic, R, iliac vein DVT;  post phlebitic syndrome, has worn compression garments in the past, but they do not fit because I am so short; sleep disturbance, Lupus ,restless leg (by report),   OBJECTIVE: OBSERVATIONS / OTHER ASSESSMENTS:   FOTO functional outcome measure: Intake score: 41%  Lymphedema Life Impact Scale (LLIS): Intake Score: 83.82% (The extent to which LE-related problems impacted daily life in the last week)  COGNITION:Overall cognitive status: Within functional limits for tasks assessed   POSTURE: WNL  LE ROM: WFL  LE MMT:  WFL for tasks assessed  BLE COMPARATIVE LIMB VOLUMETRICS:    Initial 02/06/23 LANDMARK RIGHT  (dominant)  R LEG (A-D) 2491.6 ml  R THIGH (E-G) 4538.7 ml  R FULL LIMB (A-G) 7030.3 ml  Limb Volume differential (LVD)  LVD for LEG = 11.5 %, R>L; LVD THIGH = 2.2%, R>L; LVD FULL LIMB = 5.54%, R>L  Volume change since initial %  Volume change overall V  (Blank rows = not tested)  LANDMARK LEFT    L LEG (A-D) 2204.7 ml  L THIGH (E-G) 4435.7 ml  L  FULL LIMB (A-G) 6640.4 ml  Limb Volume differential (LVD)  %  Volume change since initial %  Volume change overall %  (Blank rows = not tested)   SKIN CONDITION: Mild, Stage  II, Bilateral Lower Extremity Lymphedema 2/2 suspected CVI and Obesity  TODAY'S TREATMENT:  RLE/RLQ MLD utilizing functional inguinal watershed R leg multilayer, thigh length compression bandages  Pt edu for LE self care   PATIENT EDUCATION:  Education details: Continued lymphedema self-care training. Commenced teaching MLD Person educated: Patient Education method: Explanation, Demonstration, and Handouts Education comprehension: verbalized understanding, returned demonstration, and needs further education  HOME EXERCISE PROGRAM: 1.Therapeutic lymphatic pumping therex- 2 sets of 10 reps, hold 5 seconds; elements in order. 2 x daily PRN 2. During Intensive Phase CDT- multilayer short stretch compression wraps using gradient techniques. During Self-Management Phase, appropriate daytime compression garments and HOS device PRN.: Knee length wrap initially.  Custom-made gradient compression garments and HOS devices are medically necessary in this case because they are uniquely sized and shaped to fit the exact dimensions of the affected extremities with deformities, and to provide accurate and consistent gradient compression and containment, essential to optimally managing this patient's symptoms of chronic, progressive lymphedema. Multiple custom compression garments are needed for optimal hygiene to limit infection risk. Custom compression garments should be replaced q 3-6 months When worn consistently for optimal lipo-lymphedema self-management over time.  3. Daily skin care to sustain optimal hydration. Wash bites, scratches, blisters, etc with mild soap and water , apply antibacterial first aide cream and a band aide. 4. Simple self-Manual lymphatic drainage (MLD) PRN.  ASSESSMENT:  CLINICAL IMPRESSION: Continued MLD to RLE/ RLQ with simultaneous skin care to reduce infection risk. Pt tolerating thigh length multilayer compression wraps to groin without  difficulty since extending above the knee last week. Continued Pt education re extending wraps during application. Cont as per POC. Steady progress towards all goals.  OBJECTIVE IMPAIRMENTS: decreased balance, decreased knowledge of condition, decreased knowledge of use of DME, decreased mobility, difficulty walking, decreased strength, increased edema, impaired sensation, obesity, pain, and chronic, progressive, bilateral lower extremity swelling and associated pain .   ACTIVITY LIMITATIONS: carrying, lifting, bending, sitting, standing, squatting, sleeping, stairs, transfers, bed mobility, bathing, dressing, and fitting  LB clothing and  shoes, driving, shopping, cooking, meal prep, running errands, cleaning house  PERSONAL FACTORS: Past/current experiences, Time since onset of injury/illness/exacerbation, 3+ co morbidities,  body image, and are also affecting patient's functional outcome.   REHAB POTENTIAL: Good  EVALUATION COMPLEXITY: High   GOALS: Goals reviewed with patient? Yes  SHORT TERM GOALS: Target date: 4th OT Rx visit   Pt will demonstrate understanding of lymphedema precautions and prevention strategies with modified independence using a printed reference to identify at least 5 precautions and discussing how s/he may implement them into daily life to reduce risk of progression with extra time. Baseline:Max A Goal status: INITIAL  2.  Pt will be able to apply multilayer, knee length, gradient, compression wraps to one leg at a time with modified assistance (extra time and assistive device/s) to decrease limb volume, to limit infection risk, and to limit lymphedema progression.  Baseline: Dependent Goal status: 03/04/23 GOAL MET  LONG TERM GOALS: Target date: 04/29/23  Given this patient's Intake score of 41% on the functional outcomes FOTO tool, patient will experience an increase in function of 3 points to improve basic and instrumental ADLs performance, including lymphedema  self-care.  Baseline: Max A Goal status: INITIAL  2.  Given this patient's Intake score of 83.82 % on the Lymphedema Life Impact Scale (LLIS), patient will experience a reduction of at least 5 points in her perceived level of functional impairment resulting from lymphedema to improve functional performance and quality of life (QOL). Baseline: 83.82% Goal status: INITIAL  3.  Pt will achieve at least a 10% volume reduction in B legs to return limb to typical size and shape, to limit infection risk and LE progression, to decrease pain, to improve function. Baseline: Dependent Goal status: INITIAL  4.  Pt will obtain proper compression garments/devices and achieve modified independence (extra time + assistive devices) with donning/doffing to optimize limb volume reductions and limit LE  progression over time. Baseline: Max A Goal status: INITIAL  5.  During Intensive phase CDT , with modified independence, Pt will achieve at least 85% compliance with all lymphedema self-care home program components, including daily skin care, compression wraps and /or garments, simple self MLD and lymphatic pumping therex to habituate LE self care protocol  into ADLs for optimal LE self-management over time. Baseline: Dependent Goal status: INITIAL  PLAN:  PT FREQUENCY: 1-2x/week  PT DURATION: 12 weeks  PLANNED INTERVENTIONS: Therapeutic exercises, Therapeutic activity, Neuromuscular re-education, Balance training, Gait training, Patient/Family education, Self Care, Joint mobilization, DME instructions, Manual lymph drainage, Compression bandaging, Taping, Manual therapy, and Re-evaluation, skin care, fitting with custom daytime and HOS compression, training for assistive devices  PLAN FOR NEXT SESSION:  RLE/RLQ MLD  thigh length  multilayer compression bandaging- RLE Pt edu re LE self-care   Loel Dubonnet, MS, OTR/L, CLT-LANA 03/05/23 7:58 AM

## 2023-03-06 ENCOUNTER — Ambulatory Visit: Payer: Medicaid Other | Admitting: Occupational Therapy

## 2023-03-06 DIAGNOSIS — I890 Lymphedema, not elsewhere classified: Secondary | ICD-10-CM

## 2023-03-07 NOTE — Therapy (Addendum)
OUTPATIENT OCCUPATIONAL THERAPY TREATMENT NOTE and PROGRESS REPORT  LOWER EXTREMITY LYMPHEDEMA   Patient Name: Caroline Madden MRN: [id number] DOB:1981-07-11, 42 y.o., female Today's Date: 03/07/2023  REPORTING PERIOD: 01/29/23 - 03/06/23  END OF SESSION:   OT End of Session - 03/07/23 0900     Visit Number 10    Number of Visits 36    Date for OT Re-Evaluation 04/29/23    OT Start Time 0310    OT Stop Time 0410    OT Time Calculation (min) 60 min    Activity Tolerance Patient tolerated treatment well;No increased pain    Behavior During Therapy WFL for tasks assessed/performed               Past Medical History:  Diagnosis Date   Anxiety    NO MEDS   Cancer (HCC)    skin ca   DVT (deep venous thrombosis) (HCC) 2002   Factor V Leiden (HCC)    Headache    H/O MIGRAINES   History of kidney stones    Lymphedema    right leg-uses lymphedema pump   PE (pulmonary thromboembolism) (HCC)    Past Surgical History:  Procedure Laterality Date   CYST EXCISION  08/20/2019   Scalp   GANGLION CYST EXCISION     IR FALLOPIAN TUBE CATHETERIZATION N/A    removal per pt. of 1 fallopian tube   IVC FILTER INSERTION     LAPAROSCOPIC OVARIAN CYSTECTOMY Left 09/27/2020   Procedure: LAPAROSCOPIC OVARIAN CYSTECTOMY;  Surgeon: Nadara Mustard, MD;  Location: [facility];  Service: Gynecology;  Laterality: Left;   LAPAROSCOPIC UNILATERAL SALPINGECTOMY Left 09/27/2020   Procedure: LAPAROSCOPIC UNILATERAL SALPINGECTOMY;  Surgeon: Nadara Mustard, MD;  Location: [facility];  Service: Gynecology;  Laterality: Left;   PERIPHERAL VASCULAR THROMBECTOMY Right 11/07/2021   Procedure: PERIPHERAL VASCULAR THROMBECTOMY;  Surgeon: Renford Dills, MD;  Location: [facility];  Service: Cardiovascular;  Laterality: Right;   WISDOM TOOTH EXTRACTION     Patient Active Problem List   Diagnosis Date Noted   Right leg pain 09/14/2021   Occipital headache 09/14/2021   Menorrhagia 09/27/2020    Left ovarian cyst 08/06/2018   Adnexal mass 07/02/2018   Fibroid 07/02/2018   Lymphedema 04/28/2018   Weakness of both lower extremities 04/28/2018   Sleep disturbance 02/10/2018   Factor V Leiden (HCC) 02/11/2017   Lupus anticoagulant disorder (HCC) 02/11/2017   Shortness of breath 02/11/2017   DJD (degenerative joint disease) 02/11/2017   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 Demirjian presents to OT visit to address RLE lymphedema. Pt states pain is unchanged from initial evaluation on 7/24. She was able to apply compression wraps with extra time during visit interval. She states she wrapped less tightly and had less pain.   PERTINENT HISTORY: Relevant to lymphedema: chronic, R, iliac vein DVT: post phlebitic syndrome, has worn compression garments in the past, but they no longer fit (by report), sleep disturbance, RLS (by report), anxiety, hx skin Ca, obesity ( BMI = 51.4), Hx pulmonary thromboembolism, lupus  PAIN:  Are you having pain? Yes: NPRS scale: 6/10 Pain location: B thighs, B knees Pain description: burning, numbness, pins and needles, full, heavy, tired, weak, hot Aggravating factors: bad weather, standing, walking, dependent sitting Relieving factors: elevation, "lymphedema  machine", being in water  PRECAUTIONS: Other: LYMPHEDEMA PRECAUTIONS  WEIGHT BEARING RESTRICTIONS: No  FALLS:  Has patient fallen in last 6 months? No,  but reports frequent near misses- describes weakness in her knees w frequent LOB  LIVING ENVIRONMENT: Lives with:  30 Y o daughter with special needs Lives in: House/apartment Stairs: Yes; External: y- 3 steps; can reach both Has following equipment at home: None  OCCUPATION: retired from [employer]  LEISURE: family  HAND DOMINANCE: right   PRIOR LEVEL OF FUNCTION: Independent  PATIENT GOALS: to get everything back like it was before this started."   PERTINENT HISTORY: Insidious onset of BLE lymphedema ; denies family hx. chronic, R, iliac vein DVT;  post phlebitic syndrome, has worn compression garments in the past, but they do not fit because I am so short; sleep disturbance, Lupus ,restless leg (by report),   OBJECTIVE: OBSERVATIONS / OTHER ASSESSMENTS:   FOTO functional outcome measure: Intake score: 41%  Lymphedema Life Impact Scale (LLIS): Intake Score: 83.82% (The extent to which LE-related problems impacted daily life in the last week)  COGNITION:Overall cognitive status: Within functional limits for tasks assessed   POSTURE: WNL  LE ROM: WFL  LE MMT:  WFL for tasks assessed  BLE COMPARATIVE LIMB VOLUMETRICS:    Initial 02/06/23 LANDMARK RIGHT  (dominant)  R LEG (A-D) 2491.6 ml  R THIGH (E-G) 4538.7 ml  R FULL LIMB (A-G) 7030.3 ml  Limb Volume differential (LVD)  LVD for LEG = 11.5 %, R>L; LVD THIGH = 2.2%, R>L; LVD FULL LIMB = 5.54%, R>L  Volume change since initial %  Volume change overall V  (Blank rows = not tested)  LANDMARK LEFT    L LEG (A-D) 2204.7 ml  L THIGH (E-G) 4435.7 ml  L  FULL LIMB (A-G) 6640.4 ml  Limb Volume differential (LVD)  %  Volume change since initial %  Volume change overall %  (Blank rows = not tested)    10th Visit: TBA next visit due to time constraints.   LANDMARK  RIGHT  (dominant)  R LEG (A-D) 2491.6 ml  R THIGH (E-G) 4538.7 ml  R FULL LIMB (A-G) 7030.3 ml  Limb Volume differential (LVD)  LVD for LEG = 11.5 %, R>L; LVD THIGH = 2.2%, R>L; LVD FULL LIMB = 5.54%, R>L  Volume change since initial %  Volume change overall V  (Blank rows = not tested)    SKIN CONDITION: Mild, Stage  II, Bilateral Lower Extremity Lymphedema 2/2 suspected CVI and Obesity  TODAY'S TREATMENT:  RLE/RLQ MLD utilizing functional inguinal watershed R leg multilayer, thigh length compression bandages  Pt edu for LE self care   PATIENT EDUCATION:  Education details: Continued lymphedema self-care training.  Person educated: Patient Education method: Explanation, Demonstration, and Handouts Education comprehension: verbalized understanding, returned demonstration, and needs further education  HOME EXERCISE PROGRAM: 1.Therapeutic lymphatic pumping therex- 2 sets of 10 reps, hold 5 seconds; elements in order. 2 x daily PRN 2. During Intensive Phase CDT- multilayer short stretch compression wraps using gradient techniques. During Self-Management Phase, appropriate daytime compression garments and HOS device PRN.: Knee length wrap initially.  Custom-made gradient compression garments and HOS devices are medically necessary in this case because they are uniquely sized and shaped to fit the exact dimensions of the affected extremities with deformities, and to provide accurate and consistent gradient compression and containment, essential to optimally managing this patient's symptoms of chronic, progressive lymphedema. Multiple custom compression garments are needed for optimal hygiene to limit infection risk. Custom compression garments should be replaced q 3-6 months When worn consistently for optimal lipo-lymphedema self-management over time.  3.  Daily skin care to sustain optimal hydration. Wash bites, scratches, blisters, etc with mild soap and water , apply antibacterial first aide cream and a band aide. 4. Simple self-Manual lymphatic drainage (MLD) PRN.  ASSESSMENT:  CLINICAL IMPRESSION: Continued MLD to RLE/ RLQ with simultaneous skin care to reduce infection risk. Pt tolerating thigh length multilayer compression wraps to groin without difficulty since extending above the knee last week. Continued Pt education re extending wraps during application. Cont as per POC. Steady progress towards all goals.  OBJECTIVE IMPAIRMENTS: decreased balance, decreased knowledge of condition, decreased knowledge of use of DME, decreased mobility, difficulty walking, decreased strength, increased edema, impaired sensation, obesity, pain, and chronic, progressive, bilateral lower extremity swelling and associated pain .   ACTIVITY LIMITATIONS: carrying, lifting, bending, sitting, standing, squatting, sleeping, stairs, transfers, bed mobility, bathing, dressing, and fitting  LB clothing and  shoes, driving, shopping, cooking, meal prep, running errands, cleaning house  PERSONAL FACTORS: Past/current experiences, Time since onset of injury/illness/exacerbation, 3+ co morbidities,  body image, and are also affecting patient's functional outcome.   REHAB POTENTIAL: Good  EVALUATION COMPLEXITY: High   GOALS: Goals reviewed with patient? Yes  SHORT TERM GOALS: Target date: 4th OT Rx visit   Pt will demonstrate understanding of lymphedema precautions and prevention strategies with modified independence using a printed reference to identify at least 5 precautions and discussing how s/he may implement them into daily life to reduce risk of progression with extra time. Baseline:Max A Goal status: INITIAL  2.  Pt will be able to apply multilayer, knee length, gradient, compression wraps to one leg at a time with modified assistance (extra time and  assistive device/s) to decrease limb volume, to limit infection risk, and to limit lymphedema progression.  Baseline: Dependent Goal status: 03/04/23 GOAL MET  LONG TERM GOALS: Target date: 04/29/23  Given this patient's Intake score of 41% on the functional outcomes FOTO tool, patient will experience an increase in function of 3 points to improve basic and instrumental ADLs performance, including lymphedema self-care.  Baseline: Max A Goal status: INITIAL  2.  Given this patient's Intake score of 83.82 % on the Lymphedema Life Impact Scale (LLIS), patient will experience a reduction of at least 5 points in her perceived level of functional impairment resulting from lymphedema to improve functional performance and quality of life (QOL). Baseline: 83.82% Goal status: INITIAL  3.  Pt will achieve at least a 10% volume reduction in B legs to return limb to typical size and shape, to limit infection risk and LE progression, to decrease pain, to improve function. Baseline: Dependent Goal status: INITIAL  4.  Pt will obtain proper compression garments/devices and achieve modified independence (extra time + assistive devices) with donning/doffing to optimize limb volume reductions and limit LE  progression over time. Baseline: Max A Goal status: INITIAL  5.  During Intensive phase CDT , with modified independence, Pt will achieve at least 85% compliance with all lymphedema self-care home program components, including daily skin care, compression wraps and /or garments, simple self MLD and lymphatic pumping therex to habituate LE self care protocol  into ADLs for optimal LE self-management over time. Baseline: Dependent Goal status: INITIAL  PLAN:  PT FREQUENCY: 1-2x/week  PT DURATION: 12 weeks  PLANNED INTERVENTIONS: Therapeutic exercises, Therapeutic activity, Neuromuscular re-education, Balance training, Gait training, Patient/Family education, Self Care, Joint mobilization, DME  instructions, Manual lymph drainage, Compression bandaging, Taping, Manual therapy, and Re-evaluation, skin care, fitting with custom daytime and HOS compression, training for assistive devices  PLAN FOR NEXT SESSION:  RLE/RLQ MLD  thigh length  multilayer compression bandaging- RLE Pt edu re LE self-care   Loel Dubonnet, MS, OTR/L, CLT-LANA 03/07/23 9:02 AM

## 2023-03-07 NOTE — Progress Notes (Signed)
GYNECOLOGY PROGRESS NOTE  Subjective:    Patient ID: Caroline Madden, female    DOB: February 25, 1981, 42 y.o.   MRN: [id number]  HPI  Patient is a 42 y.o. [id number] female who presents for consultation for hysterectomy. She has a history of endometrial ablation over 10 years ago. Also with a history of Factor V Leiden deficiency and DVT, on long-term anticoagulation (over 20 years) She reports that after her procedure she would still have some bleeding or a period, however was typically light.  She gradually began resuming regular cycles, although were still fairly light.  However most recently, over the past 2 months she has begun Having 2 cycles per month, and they have become very painful, with passage of clot.  Cycles are lasting 3-5 days. Reports that approximately 2 years ago she had  a unilateral cyst and ovary removal, was planning on having a repeat endometrial ablation, however notes the procedure could not be completed.  Review of chart attributes this to significant cervical stenosis and fibroid uterus.    GYN History:  LMP: ~ 2-3 weeks ago (cannot recall exact date) Last Pap smear: 08/19/2020.  Results were: normal Last mammogram: 11/20/2022.  Results were: normal    OB History  Gravida Para Term Preterm AB Living  1 1 1     1   SAB IAB Ectopic Multiple Live Births               # Outcome Date GA Lbr Len/2nd Weight Sex Type Anes PTL Lv  1 Term              Past Medical History:  Diagnosis Date   Anxiety    NO MEDS   Cancer (HCC)    skin ca   DVT (deep venous thrombosis) (HCC) 2002   Factor V Leiden (HCC)    Headache    H/O MIGRAINES   History of kidney stones    Lymphedema    right leg-uses lymphedema pump   PE (pulmonary thromboembolism) (HCC)     Family History  Problem Relation Age of Onset   Lupus Mother    Stroke Father    Parkinson's disease Father    Hypertension Father    Gout Father    Colon cancer Maternal Grandmother    Stomach cancer Maternal  Grandmother     Past Surgical History:  Procedure Laterality Date   CYST EXCISION  08/20/2019   Scalp   GANGLION CYST EXCISION     IR FALLOPIAN TUBE CATHETERIZATION N/A    removal per pt. of 1 fallopian tube   IVC FILTER INSERTION     LAPAROSCOPIC OVARIAN CYSTECTOMY Left 09/27/2020   Procedure: LAPAROSCOPIC OVARIAN CYSTECTOMY;  Surgeon: Nadara Mustard, MD;  Location: [facility];  Service: Gynecology;  Laterality: Left;   LAPAROSCOPIC UNILATERAL SALPINGECTOMY Left 09/27/2020   Procedure: LAPAROSCOPIC UNILATERAL SALPINGECTOMY;  Surgeon: Nadara Mustard, MD;  Location: [facility];  Service: Gynecology;  Laterality: Left;   PERIPHERAL VASCULAR THROMBECTOMY Right 11/07/2021   Procedure: PERIPHERAL VASCULAR THROMBECTOMY;  Surgeon: Renford Dills, MD;  Location: [facility];  Service: Cardiovascular;  Laterality: Right;   WISDOM TOOTH EXTRACTION      Social History   Socioeconomic History   Marital status: Single    Spouse name: Not on file   Number of children: Not on file   Years of education: Not on file   Highest education level: Not on file  Occupational History   Not on  file  Tobacco Use   Smoking status: Never   Smokeless tobacco: Never  Vaping Use   Vaping status: Never Used  Substance and Sexual Activity   Alcohol use: No   Drug use: No   Sexual activity: Yes  Other Topics Concern   Not on file  Social History Narrative   Has a 48 y.o. daughter & a S.O. [name]    Social Determinants of Health   Financial Resource Strain: Medium Risk (12/12/2022)   Received from [facility], [facility]   Overall Financial Resource Strain (CARDIA)    Difficulty of Paying Living Expenses: Somewhat hard  Food Insecurity: No Food Insecurity (12/12/2022)   Received from [facility], [facility]   Hunger Vital Sign    Worried About Running Out of Food in the Last Year: Never true    Ran Out of  Food in the Last Year: Never true  Transportation Needs: No Transportation Needs (12/12/2022)   Received from [facility], [facility]   [facility] - Transportation    In the past 12 months, has lack of transportation kept you from medical appointments or from getting medications?: No    Lack of Transportation (Non-Medical): No  Physical Activity: Not on file  Stress: Not on file  Social Connections: Not on file  Intimate Partner Violence: Not on file    Current Outpatient Medications on File Prior to Visit  Medication Sig Dispense Refill   buPROPion (WELLBUTRIN XL) 150 MG 24 hr tablet Take 150 mg by mouth daily.     Clobetasol Propionate 0.05 % shampoo PLEASE SEE ATTACHED FOR DETAILED DIRECTIONS     ELIQUIS 5 MG TABS tablet TAKE 1 TABLET BY MOUTH TWICE PER DAY 60 tablet 6   fenofibrate (TRICOR) 48 MG tablet Take 48 mg by mouth daily.     Lactic Ac-Citric Ac-Pot Bitart (PHEXXI) 1.8-1-0.4 % GEL Place 1 each vaginally as needed (Up to 1 hour before intercourse). 60 g 11   No current facility-administered medications on file prior to visit.    Allergies  Allergen Reactions   Wheat Diarrhea    bloating   Penicillin V Potassium Rash   Strawberry Extract Rash     Review of Systems Constitutional: negative for chills, fatigue, fevers and sweats Eyes: negative for irritation, redness and visual disturbance Ears, nose, mouth, throat, and face: negative for hearing loss, nasal congestion, snoring and tinnitus Respiratory: negative for asthma, cough, sputum Cardiovascular: negative for chest pain, dyspnea, exertional chest pressure/discomfort, irregular heart beat, palpitations and syncope Gastrointestinal: negative for abdominal pain, change in bowel habits, nausea and vomiting Genitourinary: Positive for abnormal menstrual periods.  Negative for genital lesions, sexual problems and vaginal discharge, dysuria and urinary incontinence Integument/breast:  negative for breast lump, breast tenderness and nipple discharge Hematologic/lymphatic: negative for bleeding and easy bruising Musculoskeletal:negative for back pain and muscle weakness Neurological: negative for dizziness, headaches, vertigo and weakness Endocrine: negative for diabetic symptoms including polydipsia, polyuria and skin dryness Allergic/Immunologic: negative for hay fever and urticaria      Objective:   Blood pressure 114/86, pulse 86, resp. rate 16, height 4\' 2"  (1.27 m), weight 184 lb 4.8 oz (83.6 kg). Body mass index is 51.83 kg/m.  General appearance: alert, no distress Remainder of exam deferred   Imaging:   CLINICAL DATA:  Initial evaluation for pelvic pain for 3 months.   EXAM: TRANSABDOMINAL AND TRANSVAGINAL ULTRASOUND OF PELVIS  TECHNIQUE: Both transabdominal and transvaginal ultrasound examinations of the pelvis were performed. Transabdominal technique was performed for global imaging of the pelvis including uterus, ovaries, adnexal regions, and pelvic cul-de-sac. It was necessary to proceed with endovaginal exam following the transabdominal exam to visualize the uterus, endometrium, and ovaries.   COMPARISON:  Prior ultrasound from 01/07/2019.   FINDINGS: Uterus   Measurements: 8.8 x 3.8 x 4.2 cm = volume: 73.5 mL. Uterus is anteverted. Heterogeneous echotexture seen within the uterine myometrium without discrete fibroid or other mass.   Endometrium   Thickness: 3 mm.  No focal abnormality visualized.   Right ovary   Measurements: 2.7 x 2.5 x 2.4 cm = volume: 8.4 mL. Normal appearance/no adnexal mass.   Left ovary   Measurements: 4.8 x 4.0 x 3.9 cm = volume: 39.7 mL. 5.6 x 4.1 x 3.9 cm simple cyst seen within the left ovary. No significant internal complexity. No vascularity or solid nodularity. This measures slightly larger in size as compared to previous ultrasound (previously up to 4.3 cm).   Other findings   No abnormal free  fluid.   IMPRESSION: 1. 5.6 cm simple left ovarian cyst, slightly increased in size as compared to previous ultrasound from 01/07/2019. Recommend follow-up US in 3-6 months. Note: This recommendation does not apply to premenarchal patients or to those with increased risk (genetic, family history, elevated tumor markers or other high-risk factors) of ovarian cancer. Reference: Radiology 2019 Nov; 293(2):359-371. 2. Otherwise normal pelvic ultrasound for age. No other acute abnormality identified.     Electronically Signed   By: Rise Mu M.D.   On: 09/16/2020 18:47  Assessment:   1. Menometrorrhagia   2. History of endometrial ablation   3. Factor V Leiden (HCC)   4. History of DVT (deep vein thrombosis)   5. Chronic anticoagulation      Plan:   .1. Menometrorrhagia - Patient has abnormal uterine bleeding . Discussed workup of abnormal uterine bleeding.  Will order abnormal uterine bleeding evaluation labs and pelvic ultrasound to evaluate for any change in her fibroids or new structural causes for her bleeding. Discussed need for endometrial ablation, however this may prove difficulty if significant cervical stenosis still present. . Will perform with exam next visit.   - Briefly discussed hysterectomy, including risks/benefits of procedure, anticipated recovery time, method of completion (laparoscopic vs robotic). To discuss further next visit. Also discussed alternatives, including possible IUD placement vs repeat ablation if cervix is more amenable at this time.    2. History of endometrial ablation - Patient with prior endometrial ablation, likely with effects wearing off over time. May be difficult to resample endometrial lining due to history of procedure.   3. Factor V Leiden (HCC) - Currently on anticoagulants, may also be a cause of bleeding.   4. History of DVT (deep vein thrombosis) - Currently on anticoagulants, may also be a cause of bleeding.   5.  Chronic anticoagulation - Currently on anticoagulants, may also be a cause of bleeding. Discussed need to hold medications for 5-7 days if surgical intervention occurs. Also advised to discuss with Hematologist.    RTC in 2-3 weeks for endometrial biopsy and possible pre-op.    A total of 35 minutes were spent during this encounter, including review of previous progress notes, recent imaging and labs, face-to-face with time with patient involving counseling and coordination of care, as well as documentation for current visit.  Hildred Laser, MD [facility]

## 2023-03-08 ENCOUNTER — Encounter: Payer: Medicaid Other | Admitting: Occupational Therapy

## 2023-03-08 ENCOUNTER — Ambulatory Visit: Payer: Medicaid Other | Admitting: Obstetrics and Gynecology

## 2023-03-08 VITALS — BP 114/86 | HR 86 | Resp 16 | Ht <= 58 in | Wt 184.3 lb

## 2023-03-08 DIAGNOSIS — Z86718 Personal history of other venous thrombosis and embolism: Secondary | ICD-10-CM

## 2023-03-08 DIAGNOSIS — D6851 Activated protein C resistance: Secondary | ICD-10-CM

## 2023-03-08 DIAGNOSIS — Z9889 Other specified postprocedural states: Secondary | ICD-10-CM

## 2023-03-08 DIAGNOSIS — N926 Irregular menstruation, unspecified: Secondary | ICD-10-CM

## 2023-03-08 DIAGNOSIS — N921 Excessive and frequent menstruation with irregular cycle: Secondary | ICD-10-CM | POA: Diagnosis not present

## 2023-03-08 DIAGNOSIS — D259 Leiomyoma of uterus, unspecified: Secondary | ICD-10-CM | POA: Diagnosis not present

## 2023-03-08 DIAGNOSIS — Z7901 Long term (current) use of anticoagulants: Secondary | ICD-10-CM

## 2023-03-09 ENCOUNTER — Encounter: Payer: Self-pay | Admitting: Obstetrics and Gynecology

## 2023-03-11 ENCOUNTER — Encounter: Payer: Self-pay | Admitting: Occupational Therapy

## 2023-03-11 ENCOUNTER — Ambulatory Visit: Payer: Medicaid Other | Admitting: Occupational Therapy

## 2023-03-11 DIAGNOSIS — I890 Lymphedema, not elsewhere classified: Secondary | ICD-10-CM

## 2023-03-11 NOTE — Therapy (Unsigned)
OUTPATIENT OCCUPATIONAL THERAPY TREATMENT NOTE   LOWER EXTREMITY LYMPHEDEMA   Patient Name: Caroline Madden MRN: [id number] DOB:1981/02/01, 42 y.o., female Today's Date: 03/12/2023  END OF SESSION:   OT End of Session - 03/11/23 1259     Visit Number 11    Number of Visits 36    Date for OT Re-Evaluation 04/29/23    OT Start Time 0100    OT Stop Time 0206    OT Time Calculation (min) 66 min    Activity Tolerance Patient tolerated treatment well;No increased pain    Behavior During Therapy WFL for tasks assessed/performed               Past Medical History:  Diagnosis Date   Anxiety    NO MEDS   Cancer (HCC)    skin ca   DVT (deep venous thrombosis) (HCC) 2002   Factor V Leiden (HCC)    Headache    H/O MIGRAINES   History of kidney stones    Lymphedema    right leg-uses lymphedema pump   PE (pulmonary thromboembolism) (HCC)    Past Surgical History:  Procedure Laterality Date   CYST EXCISION  08/20/2019   Scalp   GANGLION CYST EXCISION     IR FALLOPIAN TUBE CATHETERIZATION N/A    removal per pt. of 1 fallopian tube   IVC FILTER INSERTION     LAPAROSCOPIC OVARIAN CYSTECTOMY Left 09/27/2020   Procedure: LAPAROSCOPIC OVARIAN CYSTECTOMY;  Surgeon: Nadara Mustard, MD;  Location: [facility];  Service: Gynecology;  Laterality: Left;   LAPAROSCOPIC UNILATERAL SALPINGECTOMY Left 09/27/2020   Procedure: LAPAROSCOPIC UNILATERAL SALPINGECTOMY;  Surgeon: Nadara Mustard, MD;  Location: [facility];  Service: Gynecology;  Laterality: Left;   PERIPHERAL VASCULAR THROMBECTOMY Right 11/07/2021   Procedure: PERIPHERAL VASCULAR THROMBECTOMY;  Surgeon: Renford Dills, MD;  Location: [facility];  Service: Cardiovascular;  Laterality: Right;   WISDOM TOOTH EXTRACTION     Patient Active Problem List   Diagnosis Date Noted   Right leg pain 09/14/2021   Occipital headache 09/14/2021   Menorrhagia 09/27/2020   Left ovarian cyst 08/06/2018   Adnexal mass 07/02/2018    Fibroid 07/02/2018   Lymphedema 04/28/2018   Weakness of both lower extremities 04/28/2018   Sleep disturbance 02/10/2018   Factor V Leiden (HCC) 02/11/2017   Lupus anticoagulant disorder (HCC) 02/11/2017   Shortness of breath 02/11/2017   DJD (degenerative joint disease) 02/11/2017   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 Ferron presents to OT visit to address RLE lymphedema. She states she is doing well with wrapping during intervals.  She rates lymphedema-related pain at 2/10. Pt reports bandages have been sliding down a lot.  PERTINENT HISTORY: Relevant to lymphedema: chronic, R, iliac vein DVT: post phlebitic syndrome, has worn compression garments in the past, but they no longer fit (by report), sleep disturbance, RLS (by report), anxiety, hx skin Ca, obesity ( BMI = 51.4), Hx pulmonary thromboembolism, lupus  PAIN:  Are you having pain? Yes: NPRS scale: 2/10 Pain location: B thighs, B knees Pain description: burning, numbness, pins and needles, full, heavy, tired, weak, hot Aggravating factors: bad weather, standing, walking, dependent sitting Relieving factors: elevation, "lymphedema machine", being in water  PRECAUTIONS: Other: LYMPHEDEMA PRECAUTIONS  WEIGHT BEARING  RESTRICTIONS: No  FALLS:  Has patient fallen in last 6 months? No,  but reports frequent near misses- describes weakness in her knees w frequent LOB  LIVING ENVIRONMENT: Lives with:  1 Y o daughter with special needs Lives in: House/apartment Stairs: Yes; External: y- 3 steps; can reach both Has following equipment at home: None  OCCUPATION: retired from [employer]  LEISURE: family  HAND DOMINANCE: right   PRIOR LEVEL OF FUNCTION: Independent  PATIENT GOALS: to get everything back like it was before this started."   PERTINENT HISTORY: Insidious onset of BLE lymphedema ; denies family hx. chronic, R, iliac vein DVT;  post phlebitic syndrome, has worn compression garments in the past, but they do not fit because I am so short; sleep disturbance, Lupus ,restless leg (by report),   OBJECTIVE: OBSERVATIONS / OTHER ASSESSMENTS:   FOTO functional outcome measure: Intake score: 41%  Lymphedema Life Impact Scale (LLIS): Intake Score: 83.82% (The extent to which LE-related problems impacted daily life in the last week)  COGNITION:Overall cognitive status: Within functional limits for tasks assessed   POSTURE: WNL  LE ROM: WFL  LE MMT:  WFL for tasks assessed  BLE COMPARATIVE LIMB VOLUMETRICS:    Initial 02/06/23 LANDMARK RIGHT  (dominant)  R LEG (A-D) 2491.6 ml  R THIGH (E-G) 4538.7 ml  R FULL LIMB (A-G) 7030.3 ml  Limb Volume differential (LVD)  LVD for LEG = 11.5 %, R>L; LVD THIGH = 2.2%, R>L; LVD FULL LIMB = 5.54%, R>L  Volume change since initial %  Volume change overall V  (Blank rows = not tested)  LANDMARK LEFT    L LEG (A-D) 2204.7 ml  L THIGH (E-G) 4435.7 ml  L  FULL LIMB (A-G) 6640.4 ml  Limb Volume differential (LVD)  %  Volume change since initial %  Volume change overall %  (Blank rows = not tested)      11 th visit 03/10/25:    LANDMARK RIGHT  (dominant)  R LEG (A-D) 2353.9 ml  R THIGH (E-G) 4147.7 ml  R FULL LIMB (A-G) 6501.6 ml  Limb Volume  differential (LVD)    Volume change since initial  R LEG volume decreased by 5.5% since 02/06/23. R THIGH reduced by 8.6%, and R FULL LIMB reduced by 7.52% to date.  Volume change overall V   SKIN CONDITION: Mild, Stage  II, Bilateral Lower Extremity Lymphedema 2/2 suspected CVI and Obesity  TODAY'S TREATMENT:  RLE comparative limb volumetrics R leg multilayer, thigh length compression bandages  Pt edu for LE self care- cont progress review   PATIENT EDUCATION:  Education details: Continued lymphedema self-care training. - Progress to date, compression wrapping to limit sliding down Person educated: Patient Education method: Explanation, Demonstration, and Handouts Education comprehension: verbalized understanding, returned demonstration, and needs further education  HOME EXERCISE PROGRAM: 1.Therapeutic lymphatic pumping therex- 2 sets of 10 reps, hold 5 seconds; elements in order. 2 x daily PRN 2. During Intensive Phase CDT- multilayer short stretch compression wraps using gradient techniques. During Self-Management Phase, appropriate daytime compression garments and HOS device PRN.: Knee length wrap initially.  Custom-made gradient compression garments and HOS devices are medically necessary in this case because they are uniquely sized and shaped to fit the exact dimensions of the affected extremities with deformities, and to provide accurate and consistent gradient compression and containment, essential to optimally managing this patient's symptoms of chronic, progressive lymphedema. Multiple custom compression garments are needed for optimal hygiene to limit infection risk. Custom compression garments should be replaced q 3-6 months When worn consistently for optimal lipo-lymphedema self-management over time.  3. Daily skin care to sustain optimal  hydration. Wash bites, scratches, blisters, etc with mild soap and water , apply antibacterial first aide cream and a band aide. 4. Simple self-Manual lymphatic drainage (MLD) PRN.  ASSESSMENT:  CLINICAL IMPRESSION:  RLE comparative limb volumetrics reveal that the R LEG volume is decreased by 5.5% since  commencing OT for CDT on 02/06/23. The R THIGH volume is reduced by 8.6%, and R FULL LIMB volume is reduced by 7.52%. These reductions are excellent considering this Pt's short stature. Pls refer to GOALs section for additional details re progress to date.  Pt tolerating thigh length multilayer compression wraps to groin without difficulty since extending above the knee last week. Continued Pt education re extending wraps during application. Cont as per POC. Steady progress towards all goals.  OBJECTIVE IMPAIRMENTS: decreased balance, decreased knowledge of condition, decreased knowledge of use of DME, decreased mobility, difficulty walking, decreased strength, increased edema, impaired sensation, obesity, pain, and chronic, progressive, bilateral lower extremity swelling and associated pain .   ACTIVITY LIMITATIONS: carrying, lifting, bending, sitting, standing, squatting, sleeping, stairs, transfers, bed mobility, bathing, dressing, and fitting  LB clothing and  shoes, driving, shopping, cooking, meal prep, running errands, cleaning house  PERSONAL FACTORS: Past/current experiences, Time since onset of injury/illness/exacerbation, 3+ co morbidities,  body image, and are also affecting patient's functional outcome.   REHAB POTENTIAL: Good  EVALUATION COMPLEXITY: High   GOALS: Goals reviewed with patient? Yes  SHORT TERM GOALS: Target date: 4th OT Rx visit   Pt will demonstrate understanding of lymphedema precautions and prevention strategies with modified independence using a printed reference to identify at least 5 precautions and discuss how s/he may implement them into daily life to reduce  risk of progression over time. Baseline:Max A Goal status: 03/11/23 PROGRESSING  2.  Pt will be able to apply multilayer, knee length, gradient, compression wraps to one leg at a time with modified assistance (extra time and assistive device/s) to decrease limb volume, to limit infection risk, and to limit lymphedema progression.  Baseline: Dependent Goal status: 03/04/23 GOAL MET  LONG TERM GOALS: Target date: 04/29/23  Given this patient's Intake score of 41% on the functional outcomes FOTO tool, patient will experience an increase in function of 3 points to improve basic and instrumental ADLs performance, including lymphedema self-care.  Baseline: Max  A  03/11/23 PROGRESSING  2.  Given this patient's Intake score of 83.82 % on the Lymphedema Life Impact Scale (LLIS), patient will experience a reduction of at least 5 points in her perceived level of functional impairment resulting from lymphedema to improve functional performance and quality of life (QOL). Baseline: 83.82%  03/11/23 PROGRESSING  3.  Pt will achieve at least a 10% volume reduction in B legs to return limb to typical size and shape, to limit infection risk and LE progression, to decrease pain, to improve function. Baseline: Dependent Goal status: 03/11/23 PROGRESSING RLE comparative limb volumetrics reveal that the R LEG volume is decreased by 5.5% since  commencing OT for CDT on 02/06/23. The R THIGH volume is reduced by 8.6%, and R FULL LIMB volume is reduced by 7.52%.  4.  Pt will obtain proper compression garments/devices and achieve modified independence (extra time + assistive devices) with donning/doffing to optimize limb volume reductions and limit LE  progression over time. Baseline: Max A Goal status:  03/11/23 PROGRESSING  5.  During Intensive phase CDT , with modified independence, Pt will achieve at least 85% compliance with all lymphedema self-care home program components, including daily skin care, compression wraps  and /or garments, simple self MLD and lymphatic pumping therex to habituate LE self care protocol  into ADLs for optimal LE self-management over time. Baseline: Dependent  03/11/23 PROGRESSING  PLAN:  PT FREQUENCY: 1-2x/week  PT DURATION: 12 weeks  PLANNED INTERVENTIONS: Therapeutic exercises, Therapeutic activity, Neuromuscular re-education, Balance training, Gait training, Patient/Family education, Self Care, Joint mobilization, DME instructions, Manual lymph drainage, Compression bandaging, Taping, Manual therapy, and Re-evaluation, skin care, fitting with custom daytime and HOS compression, training for assistive devices  PLAN FOR NEXT SESSION:  RLE/RLQ MLD  thigh length  multilayer compression bandaging- RLE Pt edu re LE self-care   Loel Dubonnet, MS, OTR/L, CLT-LANA 03/12/23 8:02 AM

## 2023-03-13 ENCOUNTER — Ambulatory Visit: Payer: Medicaid Other | Admitting: Occupational Therapy

## 2023-03-13 DIAGNOSIS — I890 Lymphedema, not elsewhere classified: Secondary | ICD-10-CM

## 2023-03-13 NOTE — Therapy (Signed)
OUTPATIENT OCCUPATIONAL THERAPY TREATMENT NOTE   LOWER EXTREMITY LYMPHEDEMA   Patient Name: Caroline Madden MRN: [id number] DOB:02-20-1981, 42 y.o., female Today's Date: 03/13/2023  END OF SESSION:   OT End of Session - 03/13/23 1502     Visit Number 12    Number of Visits 36    Date for OT Re-Evaluation 04/29/23    OT Start Time 0206    OT Stop Time 0301    OT Time Calculation (min) 55 min    Activity Tolerance Patient tolerated treatment well;No increased pain    Behavior During Therapy WFL for tasks assessed/performed               Past Medical History:  Diagnosis Date   Anxiety    NO MEDS   Cancer (HCC)    skin ca   DVT (deep venous thrombosis) (HCC) 2002   Factor V Leiden (HCC)    Headache    H/O MIGRAINES   History of kidney stones    Lymphedema    right leg-uses lymphedema pump   PE (pulmonary thromboembolism) (HCC)    Past Surgical History:  Procedure Laterality Date   CYST EXCISION  08/20/2019   Scalp   GANGLION CYST EXCISION     IR FALLOPIAN TUBE CATHETERIZATION N/A    removal per pt. of 1 fallopian tube   IVC FILTER INSERTION     LAPAROSCOPIC OVARIAN CYSTECTOMY Left 09/27/2020   Procedure: LAPAROSCOPIC OVARIAN CYSTECTOMY;  Surgeon: Nadara Mustard, MD;  Location: [facility];  Service: Gynecology;  Laterality: Left;   LAPAROSCOPIC UNILATERAL SALPINGECTOMY Left 09/27/2020   Procedure: LAPAROSCOPIC UNILATERAL SALPINGECTOMY;  Surgeon: Nadara Mustard, MD;  Location: [facility];  Service: Gynecology;  Laterality: Left;   PERIPHERAL VASCULAR THROMBECTOMY Right 11/07/2021   Procedure: PERIPHERAL VASCULAR THROMBECTOMY;  Surgeon: Renford Dills, MD;  Location: [facility];  Service: Cardiovascular;  Laterality: Right;   WISDOM TOOTH EXTRACTION     Patient Active Problem List   Diagnosis Date Noted   Right leg pain 09/14/2021   Occipital headache 09/14/2021   Menorrhagia 09/27/2020   Left ovarian cyst 08/06/2018   Adnexal mass 07/02/2018    Fibroid 07/02/2018   Lymphedema 04/28/2018   Weakness of both lower extremities 04/28/2018   Sleep disturbance 02/10/2018   Factor V Leiden (HCC) 02/11/2017   Lupus anticoagulant disorder (HCC) 02/11/2017   Shortness of breath 02/11/2017   DJD (degenerative joint disease) 02/11/2017   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 Leapley presents to OT visit to address RLE lymphedema. She states she is doing well with wrapping during intervals.  She rates lymphedema-related pain at 2/10. Pt reports bandages have been sliding down a lot.  PERTINENT HISTORY: Relevant to lymphedema: chronic, R, iliac vein DVT: post phlebitic syndrome, has worn compression garments in the past, but they no longer fit (by report), sleep disturbance, RLS (by report), anxiety, hx skin Ca, obesity ( BMI = 51.4), Hx pulmonary thromboembolism, lupus  PAIN:  Are you having pain? Yes: NPRS scale: 2/10 Pain location: B thighs, B knees Pain description: burning, numbness, pins and needles, full, heavy, tired, weak, hot Aggravating factors: bad weather, standing, walking, dependent sitting Relieving factors: elevation, "lymphedema machine", being in water  PRECAUTIONS: Other: LYMPHEDEMA PRECAUTIONS  WEIGHT BEARING  RESTRICTIONS: No  FALLS:  Has patient fallen in last 6 months? No,  but reports frequent near misses- describes weakness in her knees w frequent LOB  LIVING ENVIRONMENT: Lives with:  37 Y o daughter with special needs Lives in: House/apartment Stairs: Yes; External: y- 3 steps; can reach both Has following equipment at home: None  OCCUPATION: retired from [employer]  LEISURE: family  HAND DOMINANCE: right   PRIOR LEVEL OF FUNCTION: Independent  PATIENT GOALS: to get everything back like it was before this started."   PERTINENT HISTORY: Insidious onset of BLE lymphedema ; denies family hx. chronic, R, iliac vein DVT;  post phlebitic syndrome, has worn compression garments in the past, but they do not fit because I am so short; sleep disturbance, Lupus ,restless leg (by report),   OBJECTIVE: OBSERVATIONS / OTHER ASSESSMENTS:   FOTO functional outcome measure: Intake score: 41%  Lymphedema Life Impact Scale (LLIS): Intake Score: 83.82% (The extent to which LE-related problems impacted daily life in the last week)  COGNITION:Overall cognitive status: Within functional limits for tasks assessed   POSTURE: WNL  LE ROM: WFL  LE MMT:  WFL for tasks assessed  BLE COMPARATIVE LIMB VOLUMETRICS:    Initial 02/06/23 LANDMARK RIGHT  (dominant)  R LEG (A-D) 2491.6 ml  R THIGH (E-G) 4538.7 ml  R FULL LIMB (A-G) 7030.3 ml  Limb Volume differential (LVD)  LVD for LEG = 11.5 %, R>L; LVD THIGH = 2.2%, R>L; LVD FULL LIMB = 5.54%, R>L  Volume change since initial %  Volume change overall V  (Blank rows = not tested)  LANDMARK LEFT    L LEG (A-D) 2204.7 ml  L THIGH (E-G) 4435.7 ml  L  FULL LIMB (A-G) 6640.4 ml  Limb Volume differential (LVD)  %  Volume change since initial %  Volume change overall %  (Blank rows = not tested)      11 th visit 03/10/25:    LANDMARK RIGHT  (dominant)  R LEG (A-D) 2353.9 ml  R THIGH (E-G) 4147.7 ml  R FULL LIMB (A-G) 6501.6 ml  Limb Volume  differential (LVD)    Volume change since initial  R LEG volume decreased by 5.5% since 02/06/23. R THIGH reduced by 8.6%, and R FULL LIMB reduced by 7.52% to date.  Volume change overall V   SKIN CONDITION: Mild, Stage  II, Bilateral Lower Extremity Lymphedema 2/2 suspected CVI and Obesity  TODAY'S TREATMENT:  RLE comparative limb volumetrics R leg multilayer, thigh length compression bandages  Pt edu for LE self care- cont progress review   PATIENT EDUCATION:  Education details: Continued lymphedema self-care training. - Progress to date, compression wrapping to limit sliding down Person educated: Patient Education method: Explanation, Demonstration, and Handouts Education comprehension: verbalized understanding, returned demonstration, and needs further education  HOME EXERCISE PROGRAM: 1.Therapeutic lymphatic pumping therex- 2 sets of 10 reps, hold 5 seconds; elements in order. 2 x daily PRN 2. During Intensive Phase CDT- multilayer short stretch compression wraps using gradient techniques. During Self-Management Phase, appropriate daytime compression garments and HOS device PRN.: Knee length wrap initially.  Custom-made gradient compression garments and HOS devices are medically necessary in this case because they are uniquely sized and shaped to fit the exact dimensions of the affected extremities with deformities, and to provide accurate and consistent gradient compression and containment, essential to optimally managing this patient's symptoms of chronic, progressive lymphedema. Multiple custom compression garments are needed for optimal hygiene to limit infection risk. Custom compression garments should be replaced q 3-6 months When worn consistently for optimal lipo-lymphedema self-management over time.  3. Daily skin care to sustain optimal  hydration. Wash bites, scratches, blisters, etc with mild soap and water , apply antibacterial first aide cream and a band aide. 4. Simple self-Manual lymphatic drainage (MLD) PRN.  ASSESSMENT:  CLINICAL IMPRESSION: Pt tolerated RLE manual therapy and thigh length multilayer compression wraps to groin without increased pain or difficulty walking. RLE limb volume continues to slowly reduce from ankle to groin. Continued Pt education re all LE self care home program components. Cont as per POC. Steady progress towards all goals.  OBJECTIVE IMPAIRMENTS: decreased balance, decreased knowledge of condition, decreased knowledge of use of DME, decreased mobility, difficulty walking, decreased strength, increased edema, impaired sensation, obesity, pain, and chronic, progressive, bilateral lower extremity swelling and associated pain .   ACTIVITY LIMITATIONS: carrying, lifting, bending, sitting, standing, squatting, sleeping, stairs, transfers, bed mobility, bathing, dressing, and fitting  LB clothing and  shoes, driving, shopping, cooking, meal prep, running errands, cleaning house  PERSONAL FACTORS: Past/current experiences, Time since onset of injury/illness/exacerbation, 3+ co morbidities,  body image, and are also affecting patient's functional outcome.   REHAB POTENTIAL: Good  EVALUATION COMPLEXITY: High   GOALS: Goals reviewed with patient? Yes  SHORT TERM GOALS: Target date: 4th OT Rx visit   Pt will demonstrate understanding of lymphedema precautions and prevention strategies with modified independence using a printed reference to identify at least 5 precautions and discuss how s/he may implement them into daily life to reduce risk of progression over time. Baseline:Max A Goal status: 03/11/23 PROGRESSING  2.  Pt will be able to apply multilayer, knee length, gradient, compression wraps to one leg at a time with modified assistance (extra time and assistive device/s) to decrease limb  volume, to limit infection risk, and to limit lymphedema progression.  Baseline: Dependent Goal status: 03/04/23 GOAL MET  LONG TERM GOALS: Target date: 04/29/23  Given this patient's Intake score of 41% on the functional outcomes FOTO tool, patient will experience an increase in function of 3 points to improve basic and instrumental ADLs performance, including lymphedema self-care.  Baseline: Max A  03/11/23 PROGRESSING  2.  Given this patient's Intake score of 83.82 % on the Lymphedema Life Impact Scale (LLIS), patient will experience a reduction of at least 5 points in her perceived level of functional impairment resulting from lymphedema to improve functional performance and  quality of life (QOL). Baseline: 83.82%  03/11/23 PROGRESSING  3.  Pt will achieve at least a 10% volume reduction in B legs to return limb to typical size and shape, to limit infection risk and LE progression, to decrease pain, to improve function. Baseline: Dependent Goal status: 03/11/23 PROGRESSING RLE comparative limb volumetrics reveal that the R LEG volume is decreased by 5.5% since  commencing OT for CDT on 02/06/23. The R THIGH volume is reduced by 8.6%, and R FULL LIMB volume is reduced by 7.52%.  4.  Pt will obtain proper compression garments/devices and achieve modified independence (extra time + assistive devices) with donning/doffing to optimize limb volume reductions and limit LE  progression over time. Baseline: Max A Goal status:  03/11/23 PROGRESSING  5.  During Intensive phase CDT , with modified independence, Pt will achieve at least 85% compliance with all lymphedema self-care home program components, including daily skin care, compression wraps and /or garments, simple self MLD and lymphatic pumping therex to habituate LE self care protocol  into ADLs for optimal LE self-management over time. Baseline: Dependent  03/11/23 PROGRESSING  PLAN:  PT FREQUENCY: 1-2x/week  PT DURATION: 12  weeks  PLANNED INTERVENTIONS: Therapeutic exercises, Therapeutic activity, Neuromuscular re-education, Balance training, Gait training, Patient/Family education, Self Care, Joint mobilization, DME instructions, Manual lymph drainage, Compression bandaging, Taping, Manual therapy, and Re-evaluation, skin care, fitting with custom daytime and HOS compression, training for assistive devices  PLAN FOR NEXT SESSION:  RLE/RLQ MLD  thigh length  multilayer compression bandaging- RLE Pt edu re LE self-care   Loel Dubonnet, MS, OTR/L, CLT-LANA 03/13/23 3:48 PM

## 2023-03-15 ENCOUNTER — Encounter: Payer: Medicaid Other | Admitting: Occupational Therapy

## 2023-03-20 ENCOUNTER — Ambulatory Visit: Payer: Medicaid Other | Attending: Nurse Practitioner | Admitting: Occupational Therapy

## 2023-03-20 DIAGNOSIS — I890 Lymphedema, not elsewhere classified: Secondary | ICD-10-CM

## 2023-03-21 ENCOUNTER — Encounter: Payer: Self-pay | Admitting: Occupational Therapy

## 2023-03-21 NOTE — Therapy (Signed)
OUTPATIENT OCCUPATIONAL THERAPY TREATMENT NOTE   LOWER EXTREMITY LYMPHEDEMA   Patient Name: ABIGAYL GONGORA MRN: [id number] DOB:Jan 20, 1981, 42 y.o., female Today's Date: 03/21/2023  END OF SESSION:   OT End of Session - 03/21/23 0940     Visit Number 13    Number of Visits 36    Date for OT Re-Evaluation 04/29/23    OT Start Time 0200    OT Stop Time 0300    OT Time Calculation (min) 60 min    Activity Tolerance Patient tolerated treatment well;No increased pain    Behavior During Therapy WFL for tasks assessed/performed               Past Medical History:  Diagnosis Date   Anxiety    NO MEDS   Cancer (HCC)    skin ca   DVT (deep venous thrombosis) (HCC) 2002   Factor V Leiden (HCC)    Headache    H/O MIGRAINES   History of kidney stones    Lymphedema    right leg-uses lymphedema pump   PE (pulmonary thromboembolism) (HCC)    Past Surgical History:  Procedure Laterality Date   CYST EXCISION  08/20/2019   Scalp   GANGLION CYST EXCISION     IR FALLOPIAN TUBE CATHETERIZATION N/A    removal per pt. of 1 fallopian tube   IVC FILTER INSERTION     LAPAROSCOPIC OVARIAN CYSTECTOMY Left 09/27/2020   Procedure: LAPAROSCOPIC OVARIAN CYSTECTOMY;  Surgeon: Nadara Mustard, MD;  Location: [facility];  Service: Gynecology;  Laterality: Left;   LAPAROSCOPIC UNILATERAL SALPINGECTOMY Left 09/27/2020   Procedure: LAPAROSCOPIC UNILATERAL SALPINGECTOMY;  Surgeon: Nadara Mustard, MD;  Location: [facility];  Service: Gynecology;  Laterality: Left;   PERIPHERAL VASCULAR THROMBECTOMY Right 11/07/2021   Procedure: PERIPHERAL VASCULAR THROMBECTOMY;  Surgeon: Renford Dills, MD;  Location: [facility];  Service: Cardiovascular;  Laterality: Right;   WISDOM TOOTH EXTRACTION     Patient Active Problem List   Diagnosis Date Noted   Right leg pain 09/14/2021   Occipital headache 09/14/2021   Menorrhagia 09/27/2020   Left ovarian cyst 08/06/2018   Adnexal mass 07/02/2018    Fibroid 07/02/2018   Lymphedema 04/28/2018   Weakness of both lower extremities 04/28/2018   Sleep disturbance 02/10/2018   Factor V Leiden (HCC) 02/11/2017   Lupus anticoagulant disorder (HCC) 02/11/2017   Shortness of breath 02/11/2017   DJD (degenerative joint disease) 02/11/2017   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 Rubottom presents to OT visit to address RLE lymphedema. She states she is doing well with wrapping during intervals.  She rates lymphedema-related pain at 2/10. Pt reports she received pneumatic compression pump recommended by her doctor. OT is not    informed on make or model.   PERTINENT HISTORY: Relevant to lymphedema: chronic, R, iliac vein DVT: post phlebitic syndrome, has worn compression garments in the past, but they no longer fit (by report), sleep disturbance, RLS (by report), anxiety, hx skin Ca, obesity ( BMI = 51.4), Hx pulmonary thromboembolism, lupus  PAIN:  Are you having pain? Yes: NPRS scale: 2/10 Pain location: B thighs, B knees Pain description: burning, numbness, pins and needles, full, heavy, tired, weak, hot Aggravating factors: bad weather, standing, walking, dependent sitting Relieving factors: elevation, "lymphedema machine", being in  water  PRECAUTIONS: Other: LYMPHEDEMA PRECAUTIONS  WEIGHT BEARING RESTRICTIONS: No  FALLS:  Has patient fallen in last 6 months? No,  but reports frequent near misses- describes weakness in her knees w frequent LOB  LIVING ENVIRONMENT: Lives with:  40 Y o daughter with special needs Lives in: House/apartment Stairs: Yes; External: y- 3 steps; can reach both Has following equipment at home: None  OCCUPATION: retired from [employer]  LEISURE: family  HAND DOMINANCE: right   PRIOR LEVEL OF FUNCTION: Independent  PATIENT GOALS: to get everything back like it was before this started."  PERTINENT HISTORY: Insidious onset of BLE lymphedema ; denies family hx. chronic, R, iliac vein DVT;  post phlebitic syndrome, has worn compression garments in the past, but they do not fit because I am so short; sleep disturbance, Lupus ,restless leg (by report),   OBJECTIVE: OBSERVATIONS / OTHER ASSESSMENTS:   FOTO functional outcome measure: Intake score: 41%  Lymphedema Life Impact Scale (LLIS): Intake Score: 83.82% (The extent to which LE-related problems impacted daily life in the last week)  BLE COMPARATIVE LIMB VOLUMETRICS:    Initial 02/06/23 LANDMARK RIGHT  (dominant)  R LEG (A-D) 2491.6 ml  R THIGH (E-G) 4538.7 ml  R FULL LIMB (A-G) 7030.3 ml  Limb Volume differential (LVD)  LVD for LEG = 11.5 %, R>L; LVD THIGH = 2.2%, R>L; LVD FULL LIMB = 5.54%, R>L  Volume change since initial %  Volume change overall V  (Blank rows = not tested)  LANDMARK LEFT    L LEG (A-D) 2204.7 ml  L THIGH (E-G) 4435.7 ml  L  FULL LIMB (A-G) 6640.4 ml  Limb Volume differential (LVD)  %  Volume change since initial %  Volume change overall %  (Blank rows = not tested)      11 th visit 03/10/25:    LANDMARK RIGHT  (dominant)  R LEG (A-D) 2353.9 ml  R THIGH (E-G) 4147.7 ml  R FULL LIMB (A-G) 6501.6 ml  Limb Volume differential (LVD)    Volume change since initial  R LEG volume decreased by 5.5% since  02/06/23. R THIGH reduced by 8.6%, and R FULL LIMB reduced by 7.52% to date.  Volume change overall V   SKIN CONDITION: Mild, Stage  II, Bilateral Lower Extremity Lymphedema 2/2 suspected CVI and Obesity  TODAY'S TREATMENT:  RLE/RLQ MLD  R leg multilayer, thigh length compression bandages  Pt edu for LE self care- cont progress review   PATIENT EDUCATION:  Education details: Continued Pt/ CG edu for lymphedema self care home program throughout session. Topics include outcome of comparative limb volumetrics- starting limb volume differentials (LVDs), technology and gradient techniques used for short stretch, multilayer compression wrapping, simple self-MLD, therapeutic lymphatic pumping exercises, skin/nail care, LE precautions,. compression garment recommendations and specifications, wear and care schedule and compression garment donning / doffing w assistive devices. Discussed progress towards all OT goals since commencing CDT. All questions answered to the Pt's satisfaction. Good return. Person educated: Patient Education method: Explanation, Demonstration, and Handouts Education comprehension: verbalized understanding, returned demonstration, and needs further education  HOME EXERCISE PROGRAM: 1.Therapeutic lymphatic pumping therex- 2 sets of 10 reps, hold 5 seconds; elements in order. 2 x daily PRN 2. During Intensive Phase CDT- multilayer short stretch compression wraps using gradient techniques. During Self-Management Phase, appropriate daytime compression garments and HOS device PRN.: Knee length wrap initially.  Custom-made gradient compression garments and HOS devices are medically necessary in this case because they are uniquely sized and shaped to fit the exact dimensions of the affected extremities with deformities, and to provide accurate and  consistent gradient compression and containment, essential to optimally managing this patient's symptoms of chronic, progressive lymphedema. Multiple custom compression garments are needed for optimal hygiene to limit infection risk. Custom compression garments should be replaced q 3-6 months When worn consistently for optimal lipo-lymphedema self-management over time.  3. Daily skin care to sustain optimal hydration. Wash bites, scratches, blisters, etc with mild soap and water , apply antibacterial first aide cream and a band aide. 4. Simple self-Manual lymphatic drainage (MLD) PRN.  ASSESSMENT:  CLINICAL IMPRESSION: Continued Pt RLE manual therapy and thigh length multilayer compression wraps to groin without increased pain or other difficulties. RLE limb volume continues to slowly reduce from ankle to groin. Continued Pt education re all LE self care home program components. Cont as per POC. Steady progress towards all goals.  OBJECTIVE IMPAIRMENTS: decreased balance, decreased knowledge of condition, decreased knowledge of use of DME, decreased mobility, difficulty walking, decreased strength, increased edema, impaired sensation, obesity, pain, and chronic, progressive, bilateral lower extremity swelling and associated pain .   ACTIVITY LIMITATIONS: carrying, lifting, bending, sitting, standing, squatting, sleeping, stairs, transfers, bed mobility, bathing, dressing, and fitting  LB clothing and  shoes, driving, shopping, cooking, meal prep, running errands, cleaning house  PERSONAL FACTORS: Past/current experiences, Time since onset of injury/illness/exacerbation, 3+ co morbidities,  body image, and are also affecting patient's functional outcome.   REHAB POTENTIAL: Good  EVALUATION COMPLEXITY: High   GOALS: Goals reviewed with patient? Yes  SHORT TERM GOALS: Target date: 4th OT Rx visit   Pt will demonstrate understanding of lymphedema precautions and prevention strategies with  modified independence using a printed reference to identify at least 5 precautions and discuss how s/he may implement them into daily life to reduce risk of progression over time. Baseline:Max A Goal status: 03/11/23 PROGRESSING  2.  Pt will be able to apply multilayer, knee length, gradient, compression wraps to one leg at a time with modified assistance (extra time and assistive device/s) to decrease limb volume, to limit infection risk, and to limit lymphedema progression.  Baseline: Dependent Goal status: 03/04/23 GOAL MET  LONG TERM GOALS: Target date: 04/29/23  Given this patient's Intake score of 41% on the functional outcomes FOTO tool, patient will experience an  increase in function of 3 points to improve basic and instrumental ADLs performance, including lymphedema self-care.  Baseline: Max A  03/11/23 PROGRESSING  2.  Given this patient's Intake score of 83.82 % on the Lymphedema Life Impact Scale (LLIS), patient will experience a reduction of at least 5 points in her perceived level of functional impairment resulting from lymphedema to improve functional performance and quality of life (QOL). Baseline: 83.82%  03/11/23 PROGRESSING  3.  Pt will achieve at least a 10% volume reduction in B legs to return limb to typical size and shape, to limit infection risk and LE progression, to decrease pain, to improve function. Baseline: Dependent Goal status: 03/11/23 PROGRESSING RLE comparative limb volumetrics reveal that the R LEG volume is decreased by 5.5% since  commencing OT for CDT on 02/06/23. The R THIGH volume is reduced by 8.6%, and R FULL LIMB volume is reduced by 7.52%.  4.  Pt will obtain proper compression garments/devices and achieve modified independence (extra time + assistive devices) with donning/doffing to optimize limb volume reductions and limit LE  progression over time. Baseline: Max A Goal status:  03/11/23 PROGRESSING  5.  During Intensive phase CDT , with modified  independence, Pt will achieve at least 85% compliance with all lymphedema self-care home program components, including daily skin care, compression wraps and /or garments, simple self MLD and lymphatic pumping therex to habituate LE self care protocol  into ADLs for optimal LE self-management over time. Baseline: Dependent  03/11/23 PROGRESSING  PLAN:  PT FREQUENCY: 1-2x/week  PT DURATION: 12 weeks  PLANNED INTERVENTIONS: Therapeutic exercises, Therapeutic activity, Neuromuscular re-education, Balance training, Gait training, Patient/Family education, Self Care, Joint mobilization, DME instructions, Manual lymph drainage, Compression bandaging, Taping, Manual therapy, and Re-evaluation, skin care, fitting with custom daytime and HOS compression, training for assistive devices  PLAN FOR NEXT SESSION:  RLE/RLQ MLD  thigh length  multilayer compression bandaging- RLE Pt edu re LE self-care   Loel Dubonnet, MS, OTR/L, CLT-LANA 03/21/23 9:42 AM

## 2023-03-22 ENCOUNTER — Encounter: Payer: Self-pay | Admitting: Occupational Therapy

## 2023-03-22 ENCOUNTER — Ambulatory Visit: Payer: Medicaid Other | Admitting: Occupational Therapy

## 2023-03-22 DIAGNOSIS — I890 Lymphedema, not elsewhere classified: Secondary | ICD-10-CM

## 2023-03-22 NOTE — Therapy (Signed)
OUTPATIENT OCCUPATIONAL THERAPY TREATMENT NOTE   LOWER EXTREMITY LYMPHEDEMA   Patient Name: Caroline Madden MRN: [id number] DOB:[birth year], 42 y.o., female Today's Date: 03/22/2023  END OF SESSION:   OT End of Session - 03/22/23 0808     Visit Number 14    Number of Visits 36    Date for OT Re-Evaluation 04/29/23    OT Start Time 0800    OT Stop Time 0900    OT Time Calculation (min) 60 min    Activity Tolerance Patient tolerated treatment well;No increased pain    Behavior During Therapy WFL for tasks assessed/performed               Past Medical History:  Diagnosis Date   Anxiety    NO MEDS   Cancer (HCC)    skin ca   DVT (deep venous thrombosis) (HCC) 2002   Factor V Leiden (HCC)    Headache    H/O MIGRAINES   History of kidney stones    Lymphedema    right leg-uses lymphedema pump   PE (pulmonary thromboembolism) (HCC)    Past Surgical History:  Procedure Laterality Date   CYST EXCISION  08/20/2019   Scalp   GANGLION CYST EXCISION     IR FALLOPIAN TUBE CATHETERIZATION N/A    removal per pt. of 1 fallopian tube   IVC FILTER INSERTION     LAPAROSCOPIC OVARIAN CYSTECTOMY Left 09/27/2020   Procedure: LAPAROSCOPIC OVARIAN CYSTECTOMY;  Surgeon: Nadara Mustard, MD;  Location: [facility];  Service: Gynecology;  Laterality: Left;   LAPAROSCOPIC UNILATERAL SALPINGECTOMY Left 09/27/2020   Procedure: LAPAROSCOPIC UNILATERAL SALPINGECTOMY;  Surgeon: Nadara Mustard, MD;  Location: [facility];  Service: Gynecology;  Laterality: Left;   PERIPHERAL VASCULAR THROMBECTOMY Right 11/07/2021   Procedure: PERIPHERAL VASCULAR THROMBECTOMY;  Surgeon: Renford Dills, MD;  Location: [facility];  Service: Cardiovascular;  Laterality: Right;   WISDOM TOOTH EXTRACTION     Patient Active Problem List   Diagnosis Date Noted   Right leg pain 09/14/2021   Occipital headache 09/14/2021   Menorrhagia 09/27/2020   Left ovarian cyst 08/06/2018   Adnexal mass 07/02/2018    Fibroid 07/02/2018   Lymphedema 04/28/2018   Weakness of both lower extremities 04/28/2018   Sleep disturbance 02/10/2018   Factor V Leiden (HCC) 02/11/2017   Lupus anticoagulant disorder (HCC) 02/11/2017   Shortness of breath 02/11/2017   DJD (degenerative joint disease) 02/11/2017   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 Laatsch presents to OT visit to address RLE lymphedema. She states she is doing well with wrapping during intervals.  She denies lymphedema-related pain. Pt reports she received the advanced Flexitouch pneumatic compression device and she used it at home with the trainer with good results. She reports she used it over her bandages.   PERTINENT HISTORY: Relevant to lymphedema: chronic, R, iliac vein DVT: post phlebitic syndrome, has worn compression garments in the past, but they no longer fit (by report), sleep disturbance, RLS (by report), anxiety, hx skin Ca, obesity ( BMI = 51.4), Hx pulmonary thromboembolism, lupus  PAIN:  Are you having pain? Yes: NPRS scale: 2/10 Pain location: B thighs, B knees Pain description: burning, numbness, pins and needles, full, heavy, tired, weak, hot Aggravating factors: bad weather, standing, walking, dependent sitting Relieving factors:  elevation, "lymphedema machine", being in water  PRECAUTIONS: Other: LYMPHEDEMA PRECAUTIONS  WEIGHT BEARING RESTRICTIONS: No  FALLS:  Has patient fallen in last 6 months? No,  but reports frequent near misses- describes weakness in her knees w frequent LOB  LIVING ENVIRONMENT: Lives with:  65 Y O daughter with special needs Lives in: House/apartment Stairs: Yes; External: Y- 3 steps; can reach both Has following equipment at home: None  OCCUPATION: retired from [employer]  LEISURE: family  HAND DOMINANCE: right   PRIOR LEVEL OF FUNCTION: Independent  PATIENT GOALS: to get everything back like it was before this started."  PERTINENT HISTORY: Insidious onset of BLE lymphedema ; denies family hx. chronic, R, iliac vein DVT;  post phlebitic syndrome, has worn compression garments in the past, but they do not fit because I am so short; sleep disturbance, Lupus ,restless leg (by report),   OBJECTIVE: OBSERVATIONS / OTHER ASSESSMENTS:   FOTO functional outcome measure: Intake score: 41%  Lymphedema Life Impact Scale (LLIS): Intake Score: 83.82% (The extent to which LE-related problems impacted daily life in the last week)  BLE COMPARATIVE LIMB VOLUMETRICS:    Initial 02/06/23 LANDMARK RIGHT  (dominant)  R LEG (A-D) 2491.6 ml  R THIGH (E-G) 4538.7 ml  R FULL LIMB (A-G) 7030.3 ml  Limb Volume differential (LVD)  LVD for LEG = 11.5 %, R>L; LVD THIGH = 2.2%, R>L; LVD FULL LIMB = 5.54%, R>L  Volume change since initial %  Volume change overall V  (Blank rows = not tested)  LANDMARK LEFT    L LEG (A-D) 2204.7 ml  L THIGH (E-G) 4435.7 ml  L  FULL LIMB (A-G) 6640.4 ml  Limb Volume differential (LVD)  %  Volume change since initial %  Volume change overall %  (Blank rows = not tested)      11 th visit 03/10/25:    LANDMARK RIGHT  (dominant)  R LEG (A-D) 2353.9 ml  R THIGH (E-G) 4147.7 ml  R FULL LIMB (A-G) 6501.6 ml  Limb Volume differential (LVD)    Volume change since  initial  R LEG volume decreased by 5.5% since 02/06/23. R THIGH reduced by 8.6%, and R FULL LIMB reduced by 7.52% to date.  Volume change overall V   SKIN CONDITION: Mild, Stage  II, Bilateral Lower Extremity Lymphedema 2/2 suspected CVI and Obesity  TODAY'S TREATMENT:  RLE/RLQ MLD  R leg multilayer, thigh length compression bandages  Pt edu for LE self care- cont progress review   PATIENT EDUCATION:  Education details: Continued Pt/ CG edu for lymphedema self care home program throughout session. Topics include outcome of comparative limb volumetrics- starting limb volume differentials (LVDs), technology and gradient techniques used for short stretch, multilayer compression wrapping, simple self-MLD, therapeutic lymphatic pumping exercises, skin/nail care, LE precautions,. compression garment recommendations and specifications, wear and care schedule and compression garment donning / doffing w assistive devices. Discussed progress towards all OT goals since commencing CDT. All questions answered to the Pt's satisfaction. Good return. Person educated: Patient Education method: Explanation, Demonstration, and Handouts Education comprehension: verbalized understanding, returned demonstration, and needs further education  HOME EXERCISE PROGRAM: 1.Therapeutic lymphatic pumping therex- 2 sets of 10 reps, hold 5 seconds; elements in order. 2 x daily PRN 2. During Intensive Phase CDT- multilayer short stretch compression wraps using gradient techniques. During Self-Management Phase, appropriate daytime compression garments and HOS device PRN.: Knee length wrap initially.  Custom-made gradient compression garments and HOS devices are medically necessary in this case because they are uniquely sized and shaped to fit the exact dimensions of the affected extremities with  deformities, and to provide accurate and consistent gradient compression and containment, essential to optimally managing this patient's symptoms of chronic, progressive lymphedema. Multiple custom compression garments are needed for optimal hygiene to limit infection risk. Custom compression garments should be replaced q 3-6 months When worn consistently for optimal lipo-lymphedema self-management over time.  3. Daily skin care to sustain optimal hydration. Wash bites, scratches, blisters, etc with mild soap and water , apply antibacterial first aide cream and a band aide. 4. Simple self-Manual lymphatic drainage (MLD) PRN.  ASSESSMENT:  CLINICAL IMPRESSION: Provided Pt edu re Flexitouch use at home, including precautions. Pt instructed not to use device on both lower extremities at once, not to use the device more than 1 x q day, to use device skin to skin for optimal effect, not to use Flexi over compression wraps, not to use if    infection, aka cellulitis, is present, and DC use and call the doctor if you become SOB or dizzy during Flexitouch device. Continued Pt RLE manual therapy and thigh length multilayer compression wraps to groin without increased pain or other difficulties. RLE limb volume continues to slowly reduce from ankle to groin. Continued Pt education re all LE self care home program components. Cont as per POC. Steady progress towards all goals.  OBJECTIVE IMPAIRMENTS: decreased balance, decreased knowledge of condition, decreased knowledge of use of DME, decreased mobility, difficulty walking, decreased strength, increased edema, impaired sensation, obesity, pain, and chronic, progressive, bilateral lower extremity swelling and associated pain .   ACTIVITY LIMITATIONS: carrying, lifting, bending, sitting, standing, squatting, sleeping, stairs, transfers, bed mobility, bathing, dressing, and fitting  LB clothing and  shoes, driving, shopping, cooking, meal prep, running errands,  cleaning house  PERSONAL FACTORS: Past/current experiences, Time since onset of injury/illness/exacerbation, 3+ co morbidities,  body image, and are also affecting patient's functional outcome.   REHAB POTENTIAL: Good  EVALUATION COMPLEXITY: High   GOALS: Goals reviewed with patient? Yes  SHORT TERM GOALS: Target date: 4th OT Rx visit   Pt will demonstrate understanding of lymphedema precautions and prevention strategies with modified independence using a printed reference to identify at least 5 precautions and discuss how s/he may implement them into daily life to reduce risk of progression over time. Baseline:Max A Goal status:  03/11/23 PROGRESSING  2.  Pt will be able to apply multilayer, knee length, gradient, compression wraps to one leg at a time with modified assistance (extra time and assistive device/s) to decrease limb volume, to limit infection risk, and to limit lymphedema progression.  Baseline: Dependent Goal status: 03/04/23 GOAL MET  LONG TERM GOALS: Target date: 04/29/23  Given this patient's Intake score of 41% on the functional outcomes FOTO tool, patient will experience an increase in function of 3 points to improve basic and instrumental ADLs performance, including lymphedema self-care.  Baseline: Max A  03/11/23 PROGRESSING  2.  Given this patient's Intake score of 83.82 % on the Lymphedema Life Impact Scale (LLIS), patient will experience a reduction of at least 5 points in her perceived level of functional impairment resulting from lymphedema to improve functional performance and quality of life (QOL). Baseline: 83.82%  03/11/23 PROGRESSING  3.  Pt will achieve at least a 10% volume reduction in B legs to return limb to typical size and shape, to limit infection risk and LE progression, to decrease pain, to improve function. Baseline: Dependent Goal status: 03/11/23 PROGRESSING RLE comparative limb volumetrics reveal that the R LEG volume is decreased by 5.5%  since  commencing OT for CDT on 02/06/23. The R THIGH volume is reduced by 8.6%, and R FULL LIMB volume is reduced by 7.52%.  4.  Pt will obtain proper compression garments/devices and achieve modified independence (extra time + assistive devices) with donning/doffing to optimize limb volume reductions and limit LE  progression over time. Baseline: Max A Goal status:  03/11/23 PROGRESSING  5.  During Intensive phase CDT , with modified independence, Pt will achieve at least 85% compliance with all lymphedema self-care home program components, including daily skin care, compression wraps and /or garments, simple self MLD and lymphatic pumping therex to habituate LE self care protocol  into ADLs for optimal LE self-management over time. Baseline: Dependent  03/11/23 PROGRESSING  PLAN:  PT FREQUENCY: 1-2x/week  PT DURATION: 12 weeks  PLANNED INTERVENTIONS: Therapeutic exercises, Therapeutic activity, Neuromuscular re-education, Balance training, Gait training, Patient/Family education, Self Care, Joint mobilization, DME instructions, Manual lymph drainage, Compression bandaging, Taping, Manual therapy, and Re-evaluation, skin care, fitting with custom daytime and HOS compression, training for assistive devices  PLAN FOR NEXT SESSION:  RLE/RLQ MLD  thigh length  multilayer compression bandaging- RLE Pt edu re LE self-care   Loel Dubonnet, MS, OTR/L, CLT-LANA 03/22/23 11:53 AM

## 2023-03-27 ENCOUNTER — Ambulatory Visit: Payer: Medicaid Other | Admitting: Occupational Therapy

## 2023-03-27 DIAGNOSIS — I890 Lymphedema, not elsewhere classified: Secondary | ICD-10-CM

## 2023-03-27 NOTE — Therapy (Signed)
OUTPATIENT OCCUPATIONAL THERAPY TREATMENT NOTE   LOWER EXTREMITY LYMPHEDEMA   Patient Name: Caroline Madden MRN: [id number] DOB:05-08-1981, 42 y.o., female Today's Date: 03/27/2023  END OF SESSION:   OT End of Session - 03/27/23 1602     Visit Number 15    Number of Visits 36    Date for OT Re-Evaluation 04/29/23    OT Start Time 0200    OT Stop Time 0305    OT Time Calculation (min) 65 min    Activity Tolerance Patient tolerated treatment well;No increased pain    Behavior During Therapy WFL for tasks assessed/performed               Past Medical History:  Diagnosis Date   Anxiety    NO MEDS   Cancer (HCC)    skin ca   DVT (deep venous thrombosis) (HCC) 2002   Factor V Leiden (HCC)    Headache    H/O MIGRAINES   History of kidney stones    Lymphedema    right leg-uses lymphedema pump   PE (pulmonary thromboembolism) (HCC)    Past Surgical History:  Procedure Laterality Date   CYST EXCISION  08/20/2019   Scalp   GANGLION CYST EXCISION     IR FALLOPIAN TUBE CATHETERIZATION N/A    removal per pt. of 1 fallopian tube   IVC FILTER INSERTION     LAPAROSCOPIC OVARIAN CYSTECTOMY Left 09/27/2020   Procedure: LAPAROSCOPIC OVARIAN CYSTECTOMY;  Surgeon: Nadara Mustard, MD;  Location: [facility];  Service: Gynecology;  Laterality: Left;   LAPAROSCOPIC UNILATERAL SALPINGECTOMY Left 09/27/2020   Procedure: LAPAROSCOPIC UNILATERAL SALPINGECTOMY;  Surgeon: Nadara Mustard, MD;  Location: [facility];  Service: Gynecology;  Laterality: Left;   PERIPHERAL VASCULAR THROMBECTOMY Right 11/07/2021   Procedure: PERIPHERAL VASCULAR THROMBECTOMY;  Surgeon: Renford Dills, MD;  Location: [facility];  Service: Cardiovascular;  Laterality: Right;   WISDOM TOOTH EXTRACTION     Patient Active Problem List   Diagnosis Date Noted   Right leg pain 09/14/2021   Occipital headache 09/14/2021   Menorrhagia 09/27/2020   Left ovarian cyst 08/06/2018   Adnexal mass 07/02/2018    Fibroid 07/02/2018   Lymphedema 04/28/2018   Weakness of both lower extremities 04/28/2018   Sleep disturbance 02/10/2018   Factor V Leiden (HCC) 02/11/2017   Lupus anticoagulant disorder (HCC) 02/11/2017   Shortness of breath 02/11/2017   DJD (degenerative joint disease) 02/11/2017   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 presents to OT visit to address RLE lymphedema. She states she is doing well with wrapping during intervals.  She denies lymphedema-related pain. Pt reports she received the advanced Flexitouch pneumatic compression device and she used it at home with the trainer with good results. She reports she used it over her bandages.   PERTINENT HISTORY: Relevant to lymphedema: chronic, R, iliac vein DVT: post phlebitic syndrome, has worn compression garments in the past, but they no longer fit (by report), sleep disturbance, RLS (by report), anxiety, hx skin Ca, obesity ( BMI = 51.4), Hx pulmonary thromboembolism, lupus  PAIN:  Are you having pain? Yes: NPRS scale: 2/10 Pain location: B thighs, B knees Pain description: burning, numbness, pins and needles, full, heavy, tired, weak, hot Aggravating factors: bad weather, standing, walking, dependent sitting Relieving factors:  elevation, "lymphedema machine", being in water  PRECAUTIONS: Other: LYMPHEDEMA PRECAUTIONS  WEIGHT BEARING RESTRICTIONS: No  FALLS:  Has patient fallen in last 6 months? No,  but reports frequent near misses- describes weakness in her knees w frequent LOB  LIVING ENVIRONMENT: Lives with:  74 Y O daughter with special needs Lives in: House/apartment Stairs: Yes; External: Y- 3 steps; can reach both Has following equipment at home: None  OCCUPATION: retired from [employer]  LEISURE: family  HAND DOMINANCE: right   PRIOR LEVEL OF FUNCTION: Independent  PATIENT GOALS: to get everything back like it was before this started."  PERTINENT HISTORY: Insidious onset of BLE lymphedema ; denies family hx. chronic, R, iliac vein DVT;  post phlebitic syndrome, has worn compression garments in the past, but they do not fit because I am so short; sleep disturbance, Lupus ,restless leg (by report),   OBJECTIVE: OBSERVATIONS / OTHER ASSESSMENTS:   FOTO functional outcome measure: Intake score: 41%  Lymphedema Life Impact Scale (LLIS): Intake Score: 83.82% (The extent to which LE-related problems impacted daily life in the last week)  BLE COMPARATIVE LIMB VOLUMETRICS:    Initial 02/06/23 LANDMARK RIGHT  (dominant)  R LEG (A-D) 2491.6 ml  R THIGH (E-G) 4538.7 ml  R FULL LIMB (A-G) 7030.3 ml  Limb Volume differential (LVD)  LVD for LEG = 11.5 %, R>L; LVD THIGH = 2.2%, R>L; LVD FULL LIMB = 5.54%, R>L  Volume change since initial %  Volume change overall V  (Blank rows = not tested)  LANDMARK LEFT    L LEG (A-D) 2204.7 ml  L THIGH (E-G) 4435.7 ml  L  FULL LIMB (A-G) 6640.4 ml  Limb Volume differential (LVD)  %  Volume change since initial %  Volume change overall %  (Blank rows = not tested)      11 th visit 03/10/25:    LANDMARK RIGHT  (dominant)  R LEG (A-D) 2353.9 ml  R THIGH (E-G) 4147.7 ml  R FULL LIMB (A-G) 6501.6 ml  Limb Volume differential (LVD)    Volume change since  initial  R LEG volume decreased by 5.5% since 02/06/23. R THIGH reduced by 8.6%, and R FULL LIMB reduced by 7.52% to date.  Volume change overall V   SKIN CONDITION: Mild, Stage  II, Bilateral Lower Extremity Lymphedema 2/2 suspected CVI and Obesity  TODAY'S TREATMENT:  RLE/RLQ MLD  R leg multilayer, thigh length compression bandages  Pt edu for LE self care- cont progress review   PATIENT EDUCATION:  Education details: Continued Pt/ CG edu for lymphedema self care home program throughout session. Topics include outcome of comparative limb volumetrics- starting limb volume differentials (LVDs), technology and gradient techniques used for short stretch, multilayer compression wrapping, simple self-MLD, therapeutic lymphatic pumping exercises, skin/nail care, LE precautions,. compression garment recommendations and specifications, wear and care schedule and compression garment donning / doffing w assistive devices. Discussed progress towards all OT goals since commencing CDT. All questions answered to the Pt's satisfaction. Good return. Person educated: Patient Education method: Explanation, Demonstration, and Handouts Education comprehension: verbalized understanding, returned demonstration, and needs further education  HOME EXERCISE PROGRAM: 1.Therapeutic lymphatic pumping therex- 2 sets of 10 reps, hold 5 seconds; elements in order. 2 x daily PRN 2. During Intensive Phase CDT- multilayer short stretch compression wraps using gradient techniques. During Self-Management Phase, appropriate daytime compression garments and HOS device PRN.: Knee length wrap initially.  Custom-made gradient compression garments and HOS devices are medically necessary in this case because they are uniquely sized and shaped to fit the exact dimensions of the affected extremities with  deformities, and to provide accurate and consistent gradient compression and containment, essential to optimally managing this patient's symptoms of chronic, progressive lymphedema. Multiple custom compression garments are needed for optimal hygiene to limit infection risk. Custom compression garments should be replaced q 3-6 months When worn consistently for optimal lipo-lymphedema self-management over time.  3. Daily skin care to sustain optimal hydration. Wash bites, scratches, blisters, etc with mild soap and water , apply antibacterial first aide cream and a band aide. 4. Simple self-Manual lymphatic drainage (MLD) PRN.  ASSESSMENT:  CLINICAL IMPRESSION: Continued Pt RLE manual therapy and thigh length multilayer compression wraps to groin without increased pain or other difficulties. RLE limb volume continues to slowly reduce from ankle to groin. Continued Pt education re all LE self care home program components. Cont as per POC. Steady progress towards all goals.  OBJECTIVE IMPAIRMENTS: decreased balance, decreased knowledge of condition, decreased knowledge of use of DME, decreased mobility, difficulty walking, decreased strength, increased edema, impaired sensation, obesity, pain, and chronic, progressive, bilateral lower extremity swelling and associated pain .   ACTIVITY LIMITATIONS: carrying, lifting, bending, sitting, standing, squatting, sleeping, stairs, transfers, bed mobility, bathing, dressing, and fitting  LB clothing and  shoes, driving, shopping, cooking, meal prep, running errands, cleaning house  PERSONAL FACTORS: Past/current experiences, Time since onset of injury/illness/exacerbation, 3+ co morbidities,  body image, and are also affecting patient's functional outcome.   REHAB POTENTIAL: Good  EVALUATION COMPLEXITY: High   GOALS: Goals reviewed with patient? Yes  SHORT TERM GOALS: Target date: 4th OT Rx visit   Pt will demonstrate understanding of lymphedema  precautions and prevention strategies with modified independence using a printed reference to identify at least 5 precautions and discuss how s/he may implement them into daily life to reduce risk of progression over time. Baseline:Max A Goal status: 03/11/23 PROGRESSING  2.  Pt will be able to apply multilayer, knee length, gradient, compression wraps to one leg at a time with modified assistance (extra time and assistive device/s) to decrease limb volume, to limit infection risk, and to limit lymphedema progression.  Baseline: Dependent Goal status: 03/04/23 GOAL MET  LONG TERM GOALS: Target date: 04/29/23  Given this patient's Intake score of 41% on the functional outcomes FOTO tool, patient will experience an  increase in function of 3 points to improve basic and instrumental ADLs performance, including lymphedema self-care.  Baseline: Max A  03/11/23 PROGRESSING  2.  Given this patient's Intake score of 83.82 % on the Lymphedema Life Impact Scale (LLIS), patient will experience a reduction of at least 5 points in her perceived level of functional impairment resulting from lymphedema to improve functional performance and quality of life (QOL). Baseline: 83.82%  03/11/23 PROGRESSING  3.  Pt will achieve at least a 10% volume reduction in B legs to return limb to typical size and shape, to limit infection risk and LE progression, to decrease pain, to improve function. Baseline: Dependent Goal status: 03/11/23 PROGRESSING RLE comparative limb volumetrics reveal that the R LEG volume is decreased by 5.5% since  commencing OT for CDT on 02/06/23. The R THIGH volume is reduced by 8.6%, and R FULL LIMB volume is reduced by 7.52%.  4.  Pt will obtain proper compression garments/devices and achieve modified independence (extra time + assistive devices) with donning/doffing to optimize limb volume reductions and limit LE  progression over time. Baseline: Max A Goal status:  03/11/23 PROGRESSING  5.   During Intensive phase CDT , with modified independence, Pt will achieve at least 85% compliance with all lymphedema self-care home program components, including daily skin care, compression wraps and /or garments, simple self MLD and lymphatic pumping therex to habituate LE self care protocol  into ADLs for optimal LE self-management over time. Baseline: Dependent  03/11/23 PROGRESSING  PLAN:  PT FREQUENCY: 1-2x/week  PT DURATION: 12 weeks  PLANNED INTERVENTIONS: Therapeutic exercises, Therapeutic activity, Neuromuscular re-education, Balance training, Gait training, Patient/Family education, Self Care, Joint mobilization, DME instructions, Manual lymph drainage, Compression bandaging, Taping, Manual therapy, and Re-evaluation, skin care, fitting with custom daytime and HOS compression, training for assistive devices  PLAN FOR NEXT SESSION:  RLE/RLQ MLD  thigh length  multilayer compression bandaging- RLE Pt edu re LE self-care   Loel Dubonnet, MS, OTR/L, CLT-LANA 03/27/23 4:04 PM

## 2023-03-29 ENCOUNTER — Ambulatory Visit: Payer: Medicaid Other | Admitting: Occupational Therapy

## 2023-03-29 ENCOUNTER — Encounter: Payer: Self-pay | Admitting: Occupational Therapy

## 2023-03-29 DIAGNOSIS — I890 Lymphedema, not elsewhere classified: Secondary | ICD-10-CM | POA: Diagnosis not present

## 2023-03-29 NOTE — Therapy (Signed)
OUTPATIENT OCCUPATIONAL THERAPY TREATMENT NOTE   LOWER EXTREMITY LYMPHEDEMA   Patient Name: AISHAH HOMSHER MRN: [id number] DOB:16-Jan-1981, 42 y.o., female Today's Date: 03/29/2023  END OF SESSION:   OT End of Session - 03/29/23 0810     Visit Number 16    Number of Visits 36    Date for OT Re-Evaluation 04/29/23    OT Start Time 0800    OT Stop Time 0905    OT Time Calculation (min) 65 min    Activity Tolerance Patient tolerated treatment well;No increased pain    Behavior During Therapy WFL for tasks assessed/performed               Past Medical History:  Diagnosis Date   Anxiety    NO MEDS   Cancer (HCC)    skin ca   DVT (deep venous thrombosis) (HCC) 2002   Factor V Leiden (HCC)    Headache    H/O MIGRAINES   History of kidney stones    Lymphedema    right leg-uses lymphedema pump   PE (pulmonary thromboembolism) (HCC)    Past Surgical History:  Procedure Laterality Date   CYST EXCISION  08/20/2019   Scalp   GANGLION CYST EXCISION     IR FALLOPIAN TUBE CATHETERIZATION N/A    removal per pt. of 1 fallopian tube   IVC FILTER INSERTION     LAPAROSCOPIC OVARIAN CYSTECTOMY Left 09/27/2020   Procedure: LAPAROSCOPIC OVARIAN CYSTECTOMY;  Surgeon: Nadara Mustard, MD;  Location: [facility];  Service: Gynecology;  Laterality: Left;   LAPAROSCOPIC UNILATERAL SALPINGECTOMY Left 09/27/2020   Procedure: LAPAROSCOPIC UNILATERAL SALPINGECTOMY;  Surgeon: Nadara Mustard, MD;  Location: [facility];  Service: Gynecology;  Laterality: Left;   PERIPHERAL VASCULAR THROMBECTOMY Right 11/07/2021   Procedure: PERIPHERAL VASCULAR THROMBECTOMY;  Surgeon: Renford Dills, MD;  Location: [facility];  Service: Cardiovascular;  Laterality: Right;   WISDOM TOOTH EXTRACTION     Patient Active Problem List   Diagnosis Date Noted   Right leg pain 09/14/2021   Occipital headache 09/14/2021   Menorrhagia 09/27/2020   Left ovarian cyst 08/06/2018   Adnexal mass 07/02/2018    Fibroid 07/02/2018   Lymphedema 04/28/2018   Weakness of both lower extremities 04/28/2018   Sleep disturbance 02/10/2018   Factor V Leiden (HCC) 02/11/2017   Lupus anticoagulant disorder (HCC) 02/11/2017   Shortness of breath 02/11/2017   DJD (degenerative joint disease) 02/11/2017   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 Slansky presents to OT visit to address RLE lymphedema with compression bandages in place. She states she is doing well with wrapping during intervals, although wraps continue to slide down from the top edge. She denies lymphedema-related pain. Pt agreeable to completing RLE compression garment measurements today after skilled teaching.   PERTINENT HISTORY: Relevant to lymphedema: chronic, R, iliac vein DVT: post phlebitic syndrome, has worn compression garments in the past, but they no longer fit (by report), sleep disturbance, RLS (by report), anxiety, hx skin Ca, obesity ( BMI = 51.4), Hx pulmonary thromboembolism, lupus  PAIN:  Are you having pain? No: NPRS scale: 0/10 Pain location: B thighs, B knees Pain description: burning, numbness, pins and needles, full, heavy, tired, weak, hot Aggravating factors: bad weather, standing, walking, dependent sitting Relieving  factors: elevation, "lymphedema machine", being in water  PRECAUTIONS: Other: LYMPHEDEMA PRECAUTIONS  WEIGHT BEARING RESTRICTIONS: No  FALLS:  Has patient fallen in last 6 months? No,  but reports frequent near misses- describes weakness in her knees w frequent LOB  LIVING ENVIRONMENT: Lives with:  primary caregiver for 22 Y O daughter with special needs Lives in: House/apartment Stairs: Yes; External: Y- 3 steps; can reach both Has following equipment at home: Flexitouch sequential vaso pneumatic compression device  OCCUPATION: retired from [employer]  LEISURE: family  HAND DOMINANCE: right   PRIOR LEVEL OF FUNCTION: Independent  PATIENT GOALS: to get everything back like it was before this started."  PERTINENT HISTORY: Insidious onset of BLE lymphedema ; denies family hx. chronic, R, iliac vein DVT;  post phlebitic syndrome, has worn compression garments in the past, but they do not fit because I am so short; sleep disturbance, Lupus ,restless leg (by report),   OBJECTIVE: OBSERVATIONS / OTHER ASSESSMENTS:   FOTO functional outcome measure: Intake score: 41%  Lymphedema Life Impact Scale (LLIS): Intake Score: 83.82% (The extent to which LE-related problems impacted daily life in the last week)  BLE COMPARATIVE LIMB VOLUMETRICS:    Initial 02/06/23 LANDMARK RIGHT  (dominant)  R LEG (A-D) 2491.6 ml  R THIGH (E-G) 4538.7 ml  R FULL LIMB (A-G) 7030.3 ml  Limb Volume differential (LVD)  LVD for LEG = 11.5 %, R>L; LVD THIGH = 2.2%, R>L; LVD FULL LIMB = 5.54%, R>L  Volume change since initial %  Volume change overall V  (Blank rows = not tested)  LANDMARK LEFT    L LEG (A-D) 2204.7 ml  L THIGH (E-G) 4435.7 ml  L  FULL LIMB (A-G) 6640.4 ml  Limb Volume differential (LVD)  %  Volume change since initial %  Volume change overall %  (Blank rows = not tested)      11 th visit 03/10/25:    LANDMARK RIGHT  (dominant)  R LEG (A-D) 2353.9 ml  R THIGH (E-G) 4147.7 ml  R FULL  LIMB (A-G) 6501.6 ml  Limb Volume differential (LVD)    Volume change since initial  R LEG volume decreased by 5.5% since 02/06/23. R THIGH reduced by 8.6%, and R FULL LIMB reduced by 7.52% to date.  Volume change overall V   SKIN CONDITION: Mild, Stage  II, Bilateral Lower Extremity Lymphedema 2/2 suspected CVI and Obesity  TODAY'S TREATMENT:  Pt edu re compression garment / device options and recommendations Pt edu for LE self care- cont Identified initial RLE/RLQ compression garment specifications and recommendations, and completed measurements for RLE   PATIENT EDUCATION:  Education details: Continued Pt/ CG edu for lymphedema self care home program throughout session. Topics include outcome of comparative limb volumetrics- starting limb volume differentials (LVDs), technology and gradient techniques used for short stretch, multilayer compression wrapping, simple self-MLD, therapeutic lymphatic pumping exercises, skin/nail care, LE precautions,. compression garment recommendations and specifications, wear and care schedule and compression garment donning / doffing w assistive devices. Discussed progress towards all OT goals since commencing CDT. All questions answered to the Pt's satisfaction. Good return. Person educated: Patient Education method: Explanation, Demonstration, and Handouts Education comprehension: verbalized understanding, returned demonstration, and needs further education  HOME EXERCISE PROGRAM: 1.Therapeutic lymphatic pumping therex- 2 sets of 10 reps, hold 5 seconds; elements in order. 2 x daily PRN 2. During Intensive Phase CDT- multilayer short stretch compression wraps using gradient techniques. During Self-Management Phase, appropriate daytime compression garments and HOS device PRN.: Knee length wrap initially.  Custom-made  gradient compression garments and HOS devices are medically necessary in this case because they are uniquely sized and shaped to fit the exact dimensions of the affected extremities with deformities, and to provide accurate and consistent gradient compression and containment, essential to optimally managing this patient's symptoms of chronic, progressive lymphedema. Multiple custom compression garments are needed for optimal hygiene to limit infection risk. Custom compression garments should be replaced q 3-6 months When worn consistently for optimal lipo-lymphedema self-management over time.  3. Daily skin care to sustain optimal hydration. Wash bites, scratches, blisters, etc with mild soap and water , apply antibacterial first aide cream and a band aide. 4. Simple self-Manual lymphatic drainage (MLD) PRN.  ASSESSMENT:  CLINICAL IMPRESSION: Pt will benefit from 3 pr, BLE, custom, flat knit, Elvarex , knee length, ccl 2 (23-32 mmHg) compression stockings for full time daily use, paired with Sigvaris , size large, Capri length layered over top. This shorts style garment is needed to continue the compression gradient to and above the groin to address proximal swelling, and because the shape of the limb is too extreme to allow a custom, thigh high to remain in place without rolling down. Completed measurements for R full limb Jobst RELAX convoluted HOS device to limit lymphatic re accumulation  and fibrosis formation during HOS. Pt will apply wraps at home due to time constraints. Order sent to DME vendor after session. Cont as per POC. Steady progress towards all goals.  OBJECTIVE IMPAIRMENTS: decreased balance, decreased knowledge of condition, decreased knowledge of use of DME, decreased mobility, difficulty walking, decreased strength, increased edema, impaired sensation, obesity, pain, and chronic, progressive, bilateral lower extremity swelling and associated pain .   ACTIVITY LIMITATIONS: carrying,  lifting, bending, sitting, standing, squatting, sleeping, stairs, transfers, bed mobility, bathing, dressing, and fitting  LB clothing and  shoes, driving, shopping, cooking, meal prep, running errands, cleaning house  PERSONAL FACTORS: Past/current experiences, Time since onset of injury/illness/exacerbation, 3+ co morbidities,  body image, and are also affecting patient's functional outcome.   REHAB POTENTIAL: Good  EVALUATION COMPLEXITY: High   GOALS: Goals reviewed with patient? Yes  SHORT TERM GOALS: Target date: 4th OT Rx visit   Pt will demonstrate understanding of lymphedema precautions and prevention strategies with modified independence using a printed reference to identify at least 5 precautions and discuss how s/he may implement them into daily life to  reduce risk of progression over time. Baseline:Max A Goal status: 03/11/23 PROGRESSING  2.  Pt will be able to apply multilayer, knee length, gradient, compression wraps to one leg at a time with modified assistance (extra time and assistive device/s) to decrease limb volume, to limit infection risk, and to limit lymphedema progression.  Baseline: Dependent Goal status: 03/04/23 GOAL MET  LONG TERM GOALS: Target date: 04/29/23  Given this patient's Intake score of 41% on the functional outcomes FOTO tool, patient will experience an increase in function of 3 points to improve basic and instrumental ADLs performance, including lymphedema self-care.  Baseline: Max A  03/11/23 PROGRESSING  2.  Given this patient's Intake score of 83.82 % on the Lymphedema Life Impact Scale (LLIS), patient will experience a reduction of at least 5 points in her perceived level of functional impairment resulting from lymphedema to improve functional performance and quality of life (QOL). Baseline: 83.82%  03/11/23 PROGRESSING  3.  Pt will achieve at least a 10% volume reduction in B legs to return limb to typical size and shape, to limit infection  risk and LE progression, to decrease pain, to improve function. Baseline: Dependent Goal status: 03/11/23 PROGRESSING RLE comparative limb volumetrics reveal that the R LEG volume is decreased by 5.5% since  commencing OT for CDT on 02/06/23. The R THIGH volume is reduced by 8.6%, and R FULL LIMB volume is reduced by 7.52%.  4.  Pt will obtain proper compression garments/devices and achieve modified independence (extra time + assistive devices) with donning/doffing to optimize limb volume reductions and limit LE  progression over time. Baseline: Max A Goal status:  03/11/23 PROGRESSING  5.  During Intensive phase CDT , with modified independence, Pt will achieve at least 85% compliance with all lymphedema self-care home program components, including daily skin care, compression wraps and /or garments, simple self MLD and lymphatic pumping therex to habituate LE self care protocol  into ADLs for optimal LE self-management over time. Baseline: Dependent  03/11/23 PROGRESSING  PLAN:  PT FREQUENCY: 1-2x/week  PT DURATION: 12 weeks  PLANNED INTERVENTIONS: Therapeutic exercises, Therapeutic activity, Neuromuscular re-education, Balance training, Gait training, Patient/Family education, Self Care, Joint mobilization, DME instructions, Manual lymph drainage, Compression bandaging, Taping, Manual therapy, and Re-evaluation, skin care, fitting with custom daytime and HOS compression, training for assistive devices  PLAN FOR NEXT SESSION:  RLE/RLQ MLD  thigh length  multilayer compression bandaging- RLE Pt edu re LE self-care   Loel Dubonnet, MS, OTR/L, CLT-LANA 03/29/23 9:31 AM

## 2023-04-03 ENCOUNTER — Ambulatory Visit: Payer: Medicaid Other | Admitting: Occupational Therapy

## 2023-04-04 ENCOUNTER — Ambulatory Visit (INDEPENDENT_AMBULATORY_CARE_PROVIDER_SITE_OTHER): Payer: Medicaid Other

## 2023-04-04 ENCOUNTER — Ambulatory Visit (INDEPENDENT_AMBULATORY_CARE_PROVIDER_SITE_OTHER): Payer: Medicaid Other | Admitting: Obstetrics and Gynecology

## 2023-04-04 ENCOUNTER — Other Ambulatory Visit (HOSPITAL_COMMUNITY)
Admission: RE | Admit: 2023-04-04 | Discharge: 2023-04-04 | Disposition: A | Discharge status: Home / Self Care | Payer: Medicaid Other | Source: Ambulatory Visit | Attending: Obstetrics and Gynecology | Admitting: Obstetrics and Gynecology

## 2023-04-04 ENCOUNTER — Encounter: Payer: Self-pay | Admitting: Obstetrics and Gynecology

## 2023-04-04 ENCOUNTER — Other Ambulatory Visit: Payer: Self-pay | Admitting: Obstetrics and Gynecology

## 2023-04-04 VITALS — BP 107/74 | HR 83 | Ht <= 58 in | Wt 184.0 lb

## 2023-04-04 DIAGNOSIS — N83201 Unspecified ovarian cyst, right side: Secondary | ICD-10-CM

## 2023-04-04 DIAGNOSIS — Z7901 Long term (current) use of anticoagulants: Secondary | ICD-10-CM

## 2023-04-04 DIAGNOSIS — Z9889 Other specified postprocedural states: Secondary | ICD-10-CM

## 2023-04-04 DIAGNOSIS — D259 Leiomyoma of uterus, unspecified: Secondary | ICD-10-CM

## 2023-04-04 DIAGNOSIS — N921 Excessive and frequent menstruation with irregular cycle: Secondary | ICD-10-CM

## 2023-04-04 DIAGNOSIS — Z86718 Personal history of other venous thrombosis and embolism: Secondary | ICD-10-CM

## 2023-04-04 DIAGNOSIS — N939 Abnormal uterine and vaginal bleeding, unspecified: Secondary | ICD-10-CM

## 2023-04-04 DIAGNOSIS — D6851 Activated protein C resistance: Secondary | ICD-10-CM

## 2023-04-04 DIAGNOSIS — Z01818 Encounter for other preprocedural examination: Secondary | ICD-10-CM

## 2023-04-04 DIAGNOSIS — Z6841 Body Mass Index (BMI) 40.0 and over, adult: Secondary | ICD-10-CM

## 2023-04-04 DIAGNOSIS — D6862 Lupus anticoagulant syndrome: Secondary | ICD-10-CM

## 2023-04-04 NOTE — Progress Notes (Signed)
GYNECOLOGY PREOPERATIVE HISTORY AND PHYSICAL   Subjective:  Caroline Madden is a 42 y.o. G1P1001 here for pre-operative visit.  She desires surgical management of abnormal uterine bleeding.   She has a history of endometrial ablation over 10 years ago. Also with a history of Factor V Leiden deficiency and DVT, on long-term anticoagulation (over 20 years) She reports that after her procedure she would still have some bleeding or a period, however was typically light.  She gradually began resuming regular cycles, although were still fairly light.  However most recently, over the past 2 months she has begun Having 2 cycles per month, and they have become very painful, with passage of clot.  Cycles are lasting 3-5 days. Reports that approximately 2 years ago she had  a unilateral cyst and ovary removal, was planning on having a repeat endometrial ablation, however notes the procedure could not be completed.  Review of chart attributes this to significant cervical stenosis and fibroid uterus.  Patient was previously counseled on IUD placement vs repeat endometrial ablation, vs hysterectomy as options for management of her bleeding.  Patient has decided today on definitive therapy with hysterectomy.   Patient also presents today for completion for workup of abnormal uterine bleeding with endometrial biopsy. Has previously been counseled that it may be possible that biopsy cannot be completed outpatient due to history of cervical stenosis, fibroid, and prior endometrial ablation. Had ultrasound performed earlier this morning also, for review of results.    Proposed surgery: Robotic Total Laparoscopic Hysterectomy with Bilateral Salpingectomy.     Pertinent Gynecological History: Menses:  period comes every 14 days lasting 3-4 days Bleeding: dysfunctional uterine bleeding Contraception: none Last mammogram: normal Date: 11/20/22 Last pap: normal Date: 08/19/2020.     Past Medical History:  Diagnosis  Date   Anxiety    NO MEDS   Cancer (HCC)    skin ca   DVT (deep venous thrombosis) (HCC) 2002   Factor V Leiden (HCC)    Headache    H/O MIGRAINES   History of kidney stones    Lymphedema    right leg-uses lymphedema pump   PE (pulmonary thromboembolism) (HCC)     Past Surgical History:  Procedure Laterality Date   CYST EXCISION  08/20/2019   Scalp   GANGLION CYST EXCISION     IR FALLOPIAN TUBE CATHETERIZATION N/A    removal per pt. of 1 fallopian tube   IVC FILTER INSERTION     LAPAROSCOPIC OVARIAN CYSTECTOMY Left 09/27/2020   Procedure: LAPAROSCOPIC OVARIAN CYSTECTOMY;  Surgeon: Nadara Mustard, MD;  Location: [facility] ORS;  Service: Gynecology;  Laterality: Left;   LAPAROSCOPIC UNILATERAL SALPINGECTOMY Left 09/27/2020   Procedure: LAPAROSCOPIC UNILATERAL SALPINGECTOMY;  Surgeon: Nadara Mustard, MD;  Location: [facility] OR[facility];  Service: Gynecology;  Laterality: Left;   PERIPHERAL VASCULAR THROMBECTOMY Right 11/07/2021   Procedure: PERIPHERAL VASCULAR THROMBECTOMY;  Surgeon: Renford Dills, MD;  Location: [facility];  Service: Cardiovascular;  Laterality: Right;   WISDOM TOOTH EXTRACTION      OB History  Gravida Para Term Preterm AB Living  1 1 1     1   SAB IAB Ectopic Multiple Live Births               # Outcome Date GA Lbr Len/2nd Weight Sex Type Anes PTL Lv  1 Term             Family History  Problem Relation Age of Onset  Lupus Mother    Stroke Father    Parkinson's disease Father    Hypertension Father    Gout Father    Colon cancer Maternal Grandmother    Stomach cancer Maternal Grandmother     Social History   Socioeconomic History   Marital status: Single    Spouse name: Not on file   Number of children: Not on file   Years of education: Not on file   Highest education level: Not on file  Occupational History   Not on file  Tobacco Use   Smoking status: Never   Smokeless tobacco: Never  Vaping Use   Vaping status: Never Used   Substance and Sexual Activity   Alcohol use: No   Drug use: No   Sexual activity: Yes    Birth control/protection: None  Other Topics Concern   Not on file  Social History Narrative   Has a 26 y.o. daughter & a S.O. Caroline Madden    Social Determinants of Health   Financial Resource Strain: Medium Risk (12/12/2022)   Received from [facility], [facility]   Overall Financial Resource Strain (CARDIA)    Difficulty of Paying Living Expenses: Somewhat hard  Food Insecurity: No Food Insecurity (12/12/2022)   Received from [facility], [facility]   Hunger Vital Sign    Worried About Running Out of Food in the Last Year: Never true    Ran Out of Food in the Last Year: Never true  Transportation Needs: No Transportation Needs (12/12/2022)   Received from [facility], [facility]   [facility] - Transportation    In the past 12 months, has lack of transportation kept you from medical appointments or from getting medications?: No    Lack of Transportation (Non-Medical): No  Physical Activity: Not on file  Stress: Not on file  Social Connections: Not on file  Intimate Partner Violence: Not on file    Current Outpatient Medications on File Prior to Visit  Medication Sig Dispense Refill   buPROPion (WELLBUTRIN XL) 150 MG 24 hr tablet Take 150 mg by mouth daily.     ELIQUIS 5 MG TABS tablet TAKE 1 TABLET BY MOUTH TWICE PER DAY 60 tablet 6   fenofibrate (TRICOR) 48 MG tablet Take 48 mg by mouth daily.     Lactic Ac-Citric Ac-Pot Bitart (PHEXXI) 1.8-1-0.4 % GEL Place 1 each vaginally as needed (Up to 1 hour before intercourse). 60 g 11   Clobetasol Propionate 0.05 % shampoo PLEASE SEE ATTACHED FOR DETAILED DIRECTIONS (Patient not taking: Reported on 04/04/2023)     No current facility-administered medications on file prior to visit.    Allergies  Allergen Reactions   Wheat Diarrhea     bloating   Penicillin V Potassium Rash   Strawberry Extract Rash     Review of Systems Constitutional: No recent fever/chills/sweats Respiratory: No recent cough/bronchitis Cardiovascular: No chest pain Gastrointestinal: No recent nausea/vomiting/diarrhea Genitourinary: No UTI symptoms Hematologic/lymphatic:No history of coagulopathy or recent blood thinner use    Objective:   Blood pressure 107/74, pulse 83, height 4\' 5"  (1.346 m), weight 184 lb (83.5 kg), last menstrual period 03/20/2023. CONSTITUTIONAL: Well-developed, well-nourished female in no acute distress.  HENT:  Normocephalic, atraumatic, External right and left ear normal. Oropharynx is clear and moist EYES: Conjunctivae and EOM are normal. Pupils are equal, round, and reactive to light. No scleral icterus.  NECK: Normal range of motion, supple,  no masses SKIN: Skin is warm and dry. No rash noted. Not diaphoretic. No erythema. No pallor. NEUROLOGIC: Alert and oriented to person, place, and time. Normal reflexes, muscle tone coordination. No cranial nerve deficit noted. PSYCHIATRIC: Normal mood and affect. Normal behavior. Normal judgment and thought content. CARDIOVASCULAR: Normal heart rate noted, regular rhythm RESPIRATORY: Effort and breath sounds normal, no problems with respiration noted ABDOMEN: Soft, nontender, nondistended. PELVIC: Deferred MUSCULOSKELETAL: Normal range of motion. No edema and no tenderness. 2+ distal pulses.   Labs: No results found for this or any previous visit (from the past 336 hour(s)).   Imaging Studies: US PELVIS TRANSVAGINAL NON-OB (TV ONLY) ULTRASOUND REPORT   Location: [facility] Date of Service: 04/04/2023        Indications:Abnormal Uterine Bleeding Findings:  The uterus is anteverted and measures 7.95 x 4.28 x 3.62 cm. Echo texture is homogenous with evidence of focal masses. Within the uterus are multiple suspected fibroids. The largest  measuring: Fibroid 1:1.18 x 1.06 x 1.53 cm intramural  Fibroid 2:2.13 x 1.34 x 1.31 cm intramural     The Endometrium measures 3.27 mm.   Right Ovary measures 4.09 x 5.37 x 3.38  cm. It is not normal in  appearance. Two large simple cyst seen measuring :  1 - 2.75 x 2.99 x 3.12 cm 2 - 3.28 x 2.71 x 3.00 cm Left Ovary not well seen Survey of the adnexa demonstrates no adnexal masses. There is no free fluid in the cul de sac.   Impression: 1. Two fibroids noted within the uterus 2. Two simple cysts seen within rt ov   Recommendations: 1.Clinical correlation with the patient's History and Physical Exam.   Waldo Laine, RT  I have reviewed this study and agree with documented findings.   Hildred Laser, MD [facility]    Assessment:   1. Pre-op examination   2. Abnormal uterine bleeding   3. Uterine leiomyoma, unspecified location   4. Right ovarian cyst   5. History of endometrial ablation   6. Lupus anticoagulant disorder (HCC)   7. Factor V Leiden (HCC)   8. History of DVT (deep vein thrombosis)   9. Class 3 severe obesity with body mass index (BMI) of 45.0 to 49.9 in adult, unspecified obesity type, unspecified whether serious comorbidity present (HCC)      Plan:   1. Pre-op examination - Patient desires definitive management with hysterectomy.  I proposed doing a robotic-assisted total laparoscopic hysterectomy (TLH) and prophylactic bilateral salpingectomy.  No  indication for oophorectomy.  Patient agrees with this proposed surgery.  The risks of surgery were discussed in detail with the patient including but not limited to: bleeding which may require transfusion or reoperation; infection which may require antibiotics; injury to bowel, bladder, ureters or other surrounding organs; need for additional procedures including laparotomy or subsequent procedures secondary to abnormal pathology; formation of adhesions; thromboembolic phenomenon; incisional problems and  other postoperative/anesthesia complications.  Patient was also advised that she will likely be discharged home after her procedure, otherwise will admit overnight if any concerns arose postoperatively. Expected recovery time after a hysterectomy is 6-8 weeks.  Patient was told that the likelihood that her condition and symptoms will be treated effectively with this surgical management was very high; the postoperative expectations were also discussed in detail. The patient also understands the alternative treatment options which were discussed in full. All questions were answered.  She was told that she will be contacted by our  surgical scheduler regarding the time of her surgery; routine preoperative instructions will be given to her by the preoperative nursing team.  Routine postoperative instructions will be reviewed with the patient in detail after surgery.  Printed patient education handouts about the procedure was given to the patient to review at home.  Surgery to be scheduled for 04/29/2023.  - Preop testing ordered. - Instructions reviewed, including NPO after midnight. -Will send letter for medical clearance to her hematologist.  2. Abnormal uterine bleeding -Patient with resumption of bleeding after history of endometrial ablation.  Has fibroids and chronic anticoagulant use which could be contributing factors. -Endometrial biopsy performed today for completion of workup of abnormal bleeding.  Small amount of tissue was able to be sampled by a history of ablation.  3. Uterine leiomyoma, unspecified location - Reviewed ultrasound results with patient. Small uterine fibroids noted.  May possibly be a cause of patient's bleeding. Small right ovarian cysts, simple.   4. Right ovarian cyst -Simple cyst noted on right ovary during most recent ultrasound.  Not concerning however can be drained at the time of surgical intervention.  5. History of endometrial ablation -Patient declines repeat  ablation as she only received approximately 3 years of relief from her bleeding.  6. Lupus anticoagulant disorder (HCC) -Currently on chronic anticoagulant use.  Will likely need to discontinue approximately 5 to 7 days prior to her procedure.  Will discussed with hematologist.  7. Factor V Leiden (HCC) -Currently on chronic anticoagulant use.  Will likely need to discontinue approximately 5 to 7 days prior to her procedure.  Will discussed with hematologist.  8. History of DVT (deep vein thrombosis) -Currently on chronic anticoagulant use.  Will likely need to discontinue approximately 5 to 7 days prior to her procedure.  Will discussed with hematologist.   9. Class 3 severe obesity with body mass index (BMI) of 45.0 to 49.9 in adult, unspecified obesity type, unspecified whether serious comorbidity present [facility])  -Patient with history of obesity, due to body habitus is an indicator for use of robotic surgical system.    Endometrial Biopsy Procedure Note  The patient is positioned on the exam table in the dorsal lithotomy position. Bimanual exam confirms uterine position and size. A Graves speculum is placed into the vagina. A single toothed tenaculum is placed onto the anterior lip of the cervix. The pipette is placed into the endocervical canal and is advanced to the uterine fundus. Using a piston like technique, with vacuum created by withdrawing the stylus, the endometrial specimen is obtained and transferred to the biopsy container. Minimal bleeding is encountered. The procedure is well tolerated.   Uterine Position: mid   Uterine Length: 6  cm   Uterine Specimen: Scant   Post procedure instructions are given. The patient is scheduled for follow up appointment.    A total of 48 minutes were spent during this encounter, including review of previous progress notes, recent imaging and labs, face-to-face with time with patient involving counseling and coordination of care, as well as  documentation for current visit.  Hildred Laser, MD [facility]

## 2023-04-04 NOTE — Patient Instructions (Addendum)
ENDOMETRIAL BIOPSY POST-PROCEDURE INSTRUCTIONS  You may take Ibuprofen, Aleve or Tylenol for pain if needed.  Cramping should resolve within in 24 hours.  You may have a small amount of spotting.  You should wear a mini pad for the next few days.  You may have intercourse after 24 hours.  You need to call if you have any pelvic pain, fever, heavy bleeding or foul smelling vaginal discharge.  Shower or bathe as normal  6. We will call you within one week with results or we will discuss the results at your follow-up appointment if needed.     GYNECOLOGY PRE-OPERATIVE INSTRUCTIONS  You are scheduled for surgery on 04/29/2023.  The name of your procedure is: Robotic-Assisted Vaginal Hysterectomy.   Please read through these instructions carefully regarding preparation for your surgery: Nothing to eat after midnight on the day prior to surgery.  Do not take any medications unless recommended by your provider on day prior to surgery.  Do not take NSAIDs (Motrin, Aleve) or aspirin 7 days prior to surgery.  You may take Tylenol products for minor aches and pains.  You will receive a prescription for pain medications post-operatively.  You will be contacted by phone approximately 1-2 weeks prior to surgery to schedule your pre-operative appointment.  You will need someone to bring you to and from the hospital. You will not be allowed to drive for 24 hours after surgery.  Please call the office if you have any questions regarding your upcoming surgery.    Thank you for choosing [facility].

## 2023-04-05 ENCOUNTER — Ambulatory Visit: Payer: Medicaid Other | Admitting: Occupational Therapy

## 2023-04-05 ENCOUNTER — Telehealth: Payer: Self-pay

## 2023-04-05 NOTE — Telephone Encounter (Signed)
Kristi from [facility] calling that surgical pathology order is pending. Order was not signed. I signed it and looks like we are good to go per Kristi.

## 2023-04-08 ENCOUNTER — Ambulatory Visit: Payer: Medicaid Other | Admitting: Occupational Therapy

## 2023-04-08 ENCOUNTER — Encounter: Payer: Self-pay | Admitting: Obstetrics and Gynecology

## 2023-04-08 LAB — SURGICAL PATHOLOGY

## 2023-04-09 ENCOUNTER — Other Ambulatory Visit: Payer: Medicaid Other

## 2023-04-10 ENCOUNTER — Ambulatory Visit: Payer: Medicaid Other | Admitting: Occupational Therapy

## 2023-04-12 ENCOUNTER — Encounter: Payer: Medicaid Other | Admitting: Occupational Therapy

## 2023-04-15 ENCOUNTER — Ambulatory Visit: Payer: Medicaid Other | Admitting: Occupational Therapy

## 2023-04-15 DIAGNOSIS — I890 Lymphedema, not elsewhere classified: Secondary | ICD-10-CM | POA: Diagnosis not present

## 2023-04-16 ENCOUNTER — Encounter: Payer: Self-pay | Admitting: Occupational Therapy

## 2023-04-16 ENCOUNTER — Telehealth: Payer: Self-pay | Admitting: Obstetrics and Gynecology

## 2023-04-16 NOTE — Therapy (Signed)
OUTPATIENT OCCUPATIONAL THERAPY TREATMENT NOTE   LOWER EXTREMITY LYMPHEDEMA   Patient Name: Caroline Madden MRN: [id number] DOB:[birth year], 42 y.o., female Today's Date: 04/16/2023  END OF SESSION:   OT End of Session - 04/15/23 0849     Visit Number 17    Number of Visits 36    Date for OT Re-Evaluation 04/29/23    OT Start Time 1110    OT Stop Time 1210    OT Time Calculation (min) 60 min    Activity Tolerance Patient tolerated treatment well;No increased pain    Behavior During Therapy WFL for tasks assessed/performed               Past Medical History:  Diagnosis Date   Anxiety    NO MEDS   Cancer (HCC)    skin ca   DVT (deep venous thrombosis) (HCC) 2002   Factor V Leiden (HCC)    Headache    H/O MIGRAINES   History of kidney stones    Lymphedema    right leg-uses lymphedema pump   PE (pulmonary thromboembolism) (HCC)    Past Surgical History:  Procedure Laterality Date   CYST EXCISION  08/20/2019   Scalp   GANGLION CYST EXCISION     IR FALLOPIAN TUBE CATHETERIZATION N/A    removal per pt. of 1 fallopian tube   IVC FILTER INSERTION     LAPAROSCOPIC OVARIAN CYSTECTOMY Left 09/27/2020   Procedure: LAPAROSCOPIC OVARIAN CYSTECTOMY;  Surgeon: Nadara Mustard, MD;  Location: [facility];  Service: Gynecology;  Laterality: Left;   LAPAROSCOPIC UNILATERAL SALPINGECTOMY Left 09/27/2020   Procedure: LAPAROSCOPIC UNILATERAL SALPINGECTOMY;  Surgeon: Nadara Mustard, MD;  Location: [facility];  Service: Gynecology;  Laterality: Left;   PERIPHERAL VASCULAR THROMBECTOMY Right 11/07/2021   Procedure: PERIPHERAL VASCULAR THROMBECTOMY;  Surgeon: Renford Dills, MD;  Location: [facility];  Service: Cardiovascular;  Laterality: Right;   WISDOM TOOTH EXTRACTION     Patient Active Problem List   Diagnosis Date Noted   Right leg pain 09/14/2021   Occipital headache 09/14/2021   Menorrhagia 09/27/2020   Left ovarian cyst 08/06/2018   Adnexal mass 07/02/2018    Fibroid 07/02/2018   Lymphedema 04/28/2018   Weakness of both lower extremities 04/28/2018   Sleep disturbance 02/10/2018   Factor V Leiden (HCC) 02/11/2017   Lupus anticoagulant disorder (HCC) 02/11/2017   Shortness of breath 02/11/2017   DJD (degenerative joint disease) 02/11/2017   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 Alemu presents to OT visit to address RLE lymphedema with compression bandages in place. She states she is doing well with wrapping during intervals, although wraps continue to slide down from the top edge. She denies lymphedema-related pain. Pt is scheduled for a hysterectomy on  the 15 th of Oct (?) and also has a limited number of allowable visits remaining from [facility]. Discussed plan going forward throughout session.   PERTINENT HISTORY: Relevant to lymphedema: chronic, R, iliac vein DVT: post phlebitic syndrome, has worn compression garments in the past, but they no longer fit (by report), sleep disturbance, RLS (by report), anxiety, hx skin Ca, obesity ( BMI = 51.4), Hx pulmonary thromboembolism, lupus  PAIN:  Are you having pain? No: NPRS scale: 0/10 Pain location: B thighs, B knees Pain description: burning, numbness, pins and needles, full, heavy, tired, weak,  hot Aggravating factors: bad weather, standing, walking, dependent sitting Relieving factors: elevation, "lymphedema machine", being in water  PRECAUTIONS: Other: LYMPHEDEMA PRECAUTIONS  WEIGHT BEARING RESTRICTIONS: No  FALLS:  Has patient fallen in last 6 months? No,  but reports frequent near misses- describes weakness in her knees w frequent LOB  LIVING ENVIRONMENT: Lives with:  primary caregiver for 27 Y O daughter with special needs Lives in: House/apartment Stairs: Yes; External: Y- 3 steps; can reach both Has following equipment at home: Flexitouch sequential vaso pneumatic compression device  OCCUPATION: retired from [employer]  LEISURE: family  HAND DOMINANCE: right   PRIOR LEVEL OF FUNCTION: Independent  PATIENT GOALS: to get everything back like it was before this started."  PERTINENT HISTORY: Insidious onset of BLE lymphedema ; denies family hx. chronic, R, iliac vein DVT;  post phlebitic syndrome, has worn compression garments in the past, but they do not fit because I am so short; sleep disturbance, Lupus ,restless leg (by report),   OBJECTIVE: OBSERVATIONS / OTHER ASSESSMENTS:   FOTO functional outcome measure: Intake score: 41%  Lymphedema Life Impact Scale (LLIS): Intake Score: 83.82% (The extent to which LE-related problems impacted daily life in the last week)  BLE COMPARATIVE LIMB VOLUMETRICS:    Initial 02/06/23 LANDMARK RIGHT  (dominant)  R LEG (A-D) 2491.6 ml  R THIGH (E-G) 4538.7 ml  R FULL LIMB (A-G) 7030.3 ml  Limb Volume differential (LVD)  LVD for LEG = 11.5 %, R>L; LVD THIGH = 2.2%, R>L; LVD FULL LIMB = 5.54%, R>L  Volume change since initial %  Volume change overall V  (Blank rows = not tested)  LANDMARK LEFT    L LEG (A-D) 2204.7 ml  L THIGH (E-G) 4435.7 ml  L  FULL LIMB (A-G) 6640.4 ml  Limb Volume differential (LVD)  %  Volume change since initial %  Volume change overall %  (Blank rows = not tested)      11 th visit 03/10/25:     LANDMARK RIGHT  (dominant)  R LEG (A-D) 2353.9 ml  R THIGH (E-G) 4147.7 ml  R FULL LIMB (A-G) 6501.6 ml  Limb Volume differential (LVD)    Volume change since initial  R LEG volume decreased by 5.5% since 02/06/23. R THIGH reduced by 8.6%, and R FULL LIMB reduced by 7.52% to date.  Volume change overall V   SKIN CONDITION: Mild, Stage  II, Bilateral Lower Extremity Lymphedema 2/2 suspected CVI and Obesity  TODAY'S TREATMENT:  Pt edu for LE self care- cont RLE/RLQ MLD as established.  Knee length RLE compression wraps to below the knee only today OT emailed DME vendor after session to determine when garment should be arriving. All we know is that they ordered it from the manufacturer on 9/18.    PATIENT EDUCATION:  Education details: Continued Pt/ CG edu for lymphedema self care home program throughout session. Topics include outcome of comparative limb volumetrics- starting limb volume differentials (LVDs), technology and gradient techniques used for short stretch, multilayer compression wrapping, simple self-MLD, therapeutic lymphatic pumping exercises, skin/nail care, LE precautions,. compression garment recommendations and specifications, wear and care schedule and compression garment donning / doffing w assistive devices. Discussed progress towards all OT goals since commencing CDT. All questions answered to the Pt's satisfaction. Good return. Person educated: Patient Education method: Explanation, Demonstration, and Handouts Education comprehension: verbalized understanding, returned demonstration, and needs further education  HOME EXERCISE PROGRAM: 1.Therapeutic lymphatic pumping therex- 2 sets of 10 reps, hold 5 seconds; elements in order. 2 x daily PRN 2. During Intensive Phase CDT- multilayer short stretch compression wraps using gradient  techniques. During Self-Management Phase, appropriate daytime compression garments and HOS device PRN.: Knee length wrap initially.  Custom-made gradient compression garments and HOS devices are medically necessary in this case because they are uniquely sized and shaped to fit the exact dimensions of the affected extremities with deformities, and to provide accurate and consistent gradient compression and containment, essential to optimally managing this patient's symptoms of chronic, progressive lymphedema. Multiple custom compression garments are needed for optimal hygiene to limit infection risk. Custom compression garments should be replaced q 3-6 months When worn consistently for optimal lipo-lymphedema self-management over time.  3. Daily skin care to sustain optimal hydration. Wash bites, scratches, blisters, etc with mild soap and water , apply antibacterial first aide cream and a band aide. 4. Simple self-Manual lymphatic drainage (MLD) PRN.  ASSESSMENT:  CLINICAL IMPRESSION: R lower extremity swelling is very well controlled between visits. Pt tolerated MLD and reapplication of compression wraps. Educated Pt throughout obsession as to role of OT as Pt transitions to self-management phase of therapy. Since Pt has only 4 remaining visits remaining from Medicaid, we decided to reduce frequent= of CDT to conserve visits fro garment fitting. Will call when garments arrive.   OBJECTIVE IMPAIRMENTS: decreased balance, decreased knowledge of condition, decreased knowledge of use of DME, decreased mobility, difficulty walking, decreased strength, increased edema, impaired sensation, obesity, pain, and chronic, progressive, bilateral lower extremity swelling and associated pain .   ACTIVITY LIMITATIONS: carrying, lifting, bending, sitting, standing, squatting, sleeping, stairs, transfers, bed mobility, bathing, dressing, and fitting  LB clothing and  shoes, driving, shopping, cooking, meal prep,  running errands, cleaning house  PERSONAL FACTORS: Past/current experiences, Time since onset of injury/illness/exacerbation, 3+ co morbidities,  body image, and are also affecting patient's functional outcome.   REHAB POTENTIAL: Good  EVALUATION COMPLEXITY: High   GOALS: Goals reviewed with patient? Yes  SHORT TERM GOALS: Target date: 4th OT Rx visit   Pt will demonstrate understanding of lymphedema precautions and prevention strategies with modified independence using a printed reference to identify at least 5 precautions and discuss how s/he may implement them into daily life to reduce risk of progression over time. Baseline:Max A Goal status: 03/11/23 PROGRESSING  2.  Pt will be able to apply multilayer, knee length, gradient, compression wraps to one leg at a time with modified assistance (extra time and assistive device/s) to decrease  limb volume, to limit infection risk, and to limit lymphedema progression.  Baseline: Dependent Goal status: 03/04/23 GOAL MET  LONG TERM GOALS: Target date: 04/29/23  Given this patient's Intake score of 41% on the functional outcomes FOTO tool, patient will experience an increase in function of 3 points to improve basic and instrumental ADLs performance, including lymphedema self-care.  Baseline: Max A  03/11/23 PROGRESSING  2.  Given this patient's Intake score of 83.82 % on the Lymphedema Life Impact Scale (LLIS), patient will experience a reduction of at least 5 points in her perceived level of functional impairment resulting from lymphedema to improve functional performance and quality of life (QOL). Baseline: 83.82%  03/11/23 PROGRESSING  3.  Pt will achieve at least a 10% volume reduction in B legs to return limb to typical size and shape, to limit infection risk and LE progression, to decrease pain, to improve function. Baseline: Dependent Goal status: 03/11/23 PROGRESSING RLE comparative limb volumetrics reveal that the R LEG volume is  decreased by 5.5% since  commencing OT for CDT on 02/06/23. The R THIGH volume is reduced by 8.6%, and R FULL LIMB volume is reduced by 7.52%.  4.  Pt will obtain proper compression garments/devices and achieve modified independence (extra time + assistive devices) with donning/doffing to optimize limb volume reductions and limit LE  progression over time. Baseline: Max A Goal status:  03/11/23 PROGRESSING  5.  During Intensive phase CDT , with modified independence, Pt will achieve at least 85% compliance with all lymphedema self-care home program components, including daily skin care, compression wraps and /or garments, simple self MLD and lymphatic pumping therex to habituate LE self care protocol  into ADLs for optimal LE self-management over time. Baseline: Dependent  03/11/23 PROGRESSING  PLAN:  PT FREQUENCY: 1-2x/week  PT DURATION: 12 weeks  PLANNED INTERVENTIONS: Therapeutic exercises, Therapeutic activity, Neuromuscular re-education, Balance training, Gait training, Patient/Family education, Self Care, Joint mobilization, DME instructions, Manual lymph drainage, Compression bandaging, Taping, Manual therapy, and Re-evaluation, skin care, fitting with custom daytime and HOS compression, training for assistive devices  PLAN FOR NEXT SESSION:  RLE/RLQ MLD  thigh length  multilayer compression bandaging- RLE Pt edu re LE self-care   Loel Dubonnet, MS, OTR/L, CLT-LANA 04/16/23 8:54 AM

## 2023-04-16 NOTE — Telephone Encounter (Signed)
Attempted to contact patient to see if she was aware of when to discontinue her anticoagulant medication prior to surgery. Left voicemail on recommendations to discontinue 5 days prior to her surgery. Can call the office for any further questions.   Hildred Laser, MD [facility]

## 2023-04-17 ENCOUNTER — Ambulatory Visit: Payer: Medicaid Other | Admitting: Occupational Therapy

## 2023-04-19 ENCOUNTER — Encounter
Admission: RE | Admit: 2023-04-19 | Discharge: 2023-04-19 | Disposition: A | Discharge status: Home / Self Care | Payer: Medicaid Other | Source: Ambulatory Visit | Attending: Obstetrics and Gynecology | Admitting: Obstetrics and Gynecology

## 2023-04-19 ENCOUNTER — Other Ambulatory Visit: Payer: Self-pay

## 2023-04-19 DIAGNOSIS — Z01812 Encounter for preprocedural laboratory examination: Secondary | ICD-10-CM

## 2023-04-19 HISTORY — DX: Anemia, unspecified: D64.9

## 2023-04-19 NOTE — Pre-Procedure Instructions (Signed)
Patient reports that she does accept any blood products even in the event of life or death, Dr. Valentino Saxon made aware. Noted in her chart.

## 2023-04-19 NOTE — Patient Instructions (Signed)
Your procedure is scheduled on: 04/29/23 - Monday Report to the Registration Desk on the 1st floor of the [facility]. To find out your arrival time, please call [phone] between 1PM - 3PM on: 04/26/23 - Friday If your arrival time is 6:00 am, do not arrive before that time as the [facility] entrance doors do not open until 6:00 am.  REMEMBER: Instructions that are not followed completely may result in serious medical risk, up to and including death; or upon the discretion of your surgeon and anesthesiologist your surgery may need to be rescheduled.  Do not eat food or drink any liquids after midnight the night before surgery.  No gum chewing or hard candies.   One week prior to surgery: Stop Anti-inflammatories (NSAIDS) such as Advil, Aleve, Ibuprofen, Motrin, Naproxen, Naprosyn and Aspirin based products such as Excedrin, Goody's Powder, BC Powder. You may however, continue to take Tylenol if needed for pain up until the day of surgery.  Stop ANY OVER THE COUNTER supplements until after surgery.  Continue taking all prescribed medications with the exception of the following:  Hold Eliquis beginning 04/24/23.   TAKE ONLY THESE MEDICATIONS THE MORNING OF SURGERY WITH A SIP OF WATER:  buPROPion (WELLBUTRIN XL)  fenofibrate (TRICOR)    No Alcohol for 24 hours before or after surgery.  No Smoking including e-cigarettes for 24 hours before surgery.  No chewable tobacco products for at least 6 hours before surgery.  No nicotine patches on the day of surgery.  Do not use any "recreational" drugs for at least a week (preferably 2 weeks) before your surgery.  Please be advised that the combination of cocaine and anesthesia may have negative outcomes, up to and including death. If you test positive for cocaine, your surgery will be cancelled.  On the morning of surgery brush your teeth with toothpaste and water, you may rinse your mouth with mouthwash if you wish. Do not  swallow any toothpaste or mouthwash.  Use CHG Soap or wipes as directed on instruction sheet.  Do not wear jewelry, make-up, hairpins, clips or nail polish.  For welded (permanent) jewelry: bracelets, anklets, waist bands, etc.  Please have this removed prior to surgery.  If it is not removed, there is a chance that hospital personnel will need to cut it off on the day of surgery.  Do not wear lotions, powders, or perfumes.   Do not shave body hair from the neck down 48 hours before surgery.  Contact lenses, hearing aids and dentures may not be worn into surgery.  Do not bring valuables to the hospital. [facility] is not responsible for any missing/lost belongings or valuables.   Notify your doctor if there is any change in your medical condition (cold, fever, infection).  Wear comfortable clothing (specific to your surgery type) to the hospital.  After surgery, you can help prevent lung complications by doing breathing exercises.  Take deep breaths and cough every 1-2 hours. Your doctor may order a device called an Incentive Spirometer to help you take deep breaths. When coughing or sneezing, hold a pillow firmly against your incision with both hands. This is called "splinting." Doing this helps protect your incision. It also decreases belly discomfort.  If you are being admitted to the hospital overnight, leave your suitcase in the car. After surgery it may be brought to your room.  In case of increased patient census, it may be necessary for you, the patient, to continue your postoperative care in  the Same Day Surgery department.  If you are being discharged the day of surgery, you will not be allowed to drive home. You will need a responsible individual to drive you home and stay with you for 24 hours after surgery.   If you are taking public transportation, you will need to have a responsible individual with you.  Please call the Pre-admissions Testing Dept. at [phone]  if you have any questions about these instructions.  Surgery Visitation Policy:  Patients having surgery or a procedure may have two visitors.  Children under the age of 33 must have an adult with them who is not the patient.  Inpatient Visitation:    Visiting hours are 7 a.m. to 8 p.m. Up to four visitors are allowed at one time in a patient room. The visitors may rotate out with other people during the day.  One visitor age 18 or older may stay with the patient overnight and must be in the room by 8 p.m.     Preparing for Surgery with CHLORHEXIDINE GLUCONATE (CHG) Soap  Chlorhexidine Gluconate (CHG) Soap  o An antiseptic cleaner that kills germs and bonds with the skin to continue killing germs even after washing  o Used for showering the night before surgery and morning of surgery  Before surgery, you can play an important role by reducing the number of germs on your skin.  CHG (Chlorhexidine gluconate) soap is an antiseptic cleanser which kills germs and bonds with the skin to continue killing germs even after washing.  Please do not use if you have an allergy to CHG or antibacterial soaps. If your skin becomes reddened/irritated stop using the CHG.  1. Shower the NIGHT BEFORE SURGERY and the MORNING OF SURGERY with CHG soap.  2. If you choose to wash your hair, wash your hair first as usual with your normal shampoo.  3. After shampooing, rinse your hair and body thoroughly to remove the shampoo.  4. Use CHG as you would any other liquid soap. You can apply CHG directly to the skin and wash gently with a scrungie or a clean washcloth.  5. Apply the CHG soap to your body only from the neck down. Do not use on open wounds or open sores. Avoid contact with your eyes, ears, mouth, and genitals (private parts). Wash face and genitals (private parts) with your normal soap.  6. Wash thoroughly, paying special attention to the area where your surgery will be performed.  7.  Thoroughly rinse your body with warm water.  8. Do not shower/wash with your normal soap after using and rinsing off the CHG soap.  9. Pat yourself dry with a clean towel.  10. Wear clean pajamas to bed the night before surgery.  12. Place clean sheets on your bed the night of your first shower and do not sleep with pets.  13. Shower again with the CHG soap on the day of surgery prior to arriving at the hospital.  14. Do not apply any deodorants/lotions/powders.  15. Please wear clean clothes to the hospital.  How to Use an Incentive Spirometer  An incentive spirometer is a tool that measures how well you are filling your lungs with each breath. Learning to take long, deep breaths using this tool can help you keep your lungs clear and active. This may help to reverse or lessen your chance of developing breathing (pulmonary) problems, especially infection. You may be asked to use a spirometer: After a surgery. If you  have a lung problem or a history of smoking. After a long period of time when you have been unable to move or be active. If the spirometer includes an indicator to show the highest number that you have reached, your health care provider or respiratory therapist will help you set a goal. Keep a log of your progress as told by your health care provider. What are the risks? Breathing too quickly may cause dizziness or cause you to pass out. Take your time so you do not get dizzy or light-headed. If you are in pain, you may need to take pain medicine before doing incentive spirometry. It is harder to take a deep breath if you are having pain. How to use your incentive spirometer  Sit up on the edge of your bed or on a chair. Hold the incentive spirometer so that it is in an upright position. Before you use the spirometer, breathe out normally. Place the mouthpiece in your mouth. Make sure your lips are closed tightly around it. Breathe in slowly and as deeply as you can  through your mouth, causing the piston or the ball to rise toward the top of the chamber. Hold your breath for 3-5 seconds, or for as long as possible. If the spirometer includes a coach indicator, use this to guide you in breathing. Slow down your breathing if the indicator goes above the marked areas. Remove the mouthpiece from your mouth and breathe out normally. The piston or ball will return to the bottom of the chamber. Rest for a few seconds, then repeat the steps 10 or more times. Take your time and take a few normal breaths between deep breaths so that you do not get dizzy or light-headed. Do this every 1-2 hours when you are awake. If the spirometer includes a goal marker to show the highest number you have reached (best effort), use this as a goal to work toward during each repetition. After each set of 10 deep breaths, cough a few times. This will help to make sure that your lungs are clear. If you have an incision on your chest or abdomen from surgery, place a pillow or a rolled-up towel firmly against the incision when you cough. This can help to reduce pain while taking deep breaths and coughing. General tips When you are able to get out of bed: Walk around often. Continue to take deep breaths and cough in order to clear your lungs. Keep using the incentive spirometer until your health care provider says it is okay to stop using it. If you have been in the hospital, you may be told to keep using the spirometer at home. Contact a health care provider if: You are having difficulty using the spirometer. You have trouble using the spirometer as often as instructed. Your pain medicine is not giving enough relief for you to use the spirometer as told. You have a fever. Get help right away if: You develop shortness of breath. You develop a cough with bloody mucus from the lungs. You have fluid or blood coming from an incision site after you cough. Summary An incentive spirometer is a  tool that can help you learn to take long, deep breaths to keep your lungs clear and active. You may be asked to use a spirometer after a surgery, if you have a lung problem or a history of smoking, or if you have been inactive for a long period of time. Use your incentive spirometer as instructed every 1-2  hours while you are awake. If you have an incision on your chest or abdomen, place a pillow or a rolled-up towel firmly against your incision when you cough. This will help to reduce pain. Get help right away if you have shortness of breath, you cough up bloody mucus, or blood comes from your incision when you cough. This information is not intended to replace advice given to you by your health care provider. Make sure you discuss any questions you have with your health care provider. Document Revised: 09/21/2019 Document Reviewed: 09/21/2019 Elsevier Patient Education  2023 [employer].

## 2023-04-22 ENCOUNTER — Ambulatory Visit: Payer: Medicaid Other | Admitting: Occupational Therapy

## 2023-04-24 ENCOUNTER — Other Ambulatory Visit: Payer: Medicaid Other

## 2023-04-24 ENCOUNTER — Encounter: Payer: Medicaid Other | Admitting: Occupational Therapy

## 2023-04-24 ENCOUNTER — Ambulatory Visit: Payer: Medicaid Other | Admitting: Occupational Therapy

## 2023-04-26 ENCOUNTER — Encounter: Payer: Self-pay | Admitting: Urgent Care

## 2023-04-26 ENCOUNTER — Encounter
Admission: RE | Admit: 2023-04-26 | Discharge: 2023-04-26 | Disposition: A | Discharge status: Home / Self Care | Payer: Medicaid Other | Source: Ambulatory Visit | Attending: Obstetrics and Gynecology | Admitting: Obstetrics and Gynecology

## 2023-04-26 DIAGNOSIS — Z01812 Encounter for preprocedural laboratory examination: Secondary | ICD-10-CM | POA: Diagnosis not present

## 2023-04-26 DIAGNOSIS — Z01818 Encounter for other preprocedural examination: Secondary | ICD-10-CM

## 2023-04-26 LAB — COMPREHENSIVE METABOLIC PANEL
ALT: 16 U/L (ref 0–44)
AST: 17 U/L (ref 15–41)
Albumin: 4.3 g/dL (ref 3.5–5.0)
Alkaline Phosphatase: 50 U/L (ref 38–126)
Anion gap: 11 (ref 5–15)
BUN: 23 mg/dL — ABNORMAL HIGH (ref 6–20)
CO2: 25 mmol/L (ref 22–32)
Calcium: 9.5 mg/dL (ref 8.9–10.3)
Chloride: 101 mmol/L (ref 98–111)
Creatinine, Ser: 0.58 mg/dL (ref 0.44–1.00)
GFR, Estimated: >60 mL/min (ref >60)
Glucose, Bld: 89 mg/dL (ref 70–99)
Potassium: 3.9 mmol/L (ref 3.5–5.1)
Sodium: 137 mmol/L (ref 135–145)
Total Bilirubin: 0.6 mg/dL (ref 0.3–1.2)
Total Protein: 7.7 g/dL (ref 6.5–8.1)

## 2023-04-26 LAB — CBC
HCT: 50.3 % — ABNORMAL HIGH (ref 36.0–46.0)
Hemoglobin: 16.8 g/dL — ABNORMAL HIGH (ref 12.0–15.0)
MCH: 30.7 pg (ref 26.0–34.0)
MCHC: 33.4 g/dL (ref 30.0–36.0)
MCV: 91.8 fL (ref 80.0–100.0)
Platelets: 283 10*3/uL (ref 150–400)
RBC: 5.48 MIL/uL — ABNORMAL HIGH (ref 3.87–5.11)
RDW: 13.2 % (ref 11.5–15.5)
WBC: 6.1 10*3/uL (ref 4.0–10.5)
nRBC: 0 % (ref 0.0–0.2)

## 2023-04-26 LAB — RAPID HIV SCREEN (HIV 1/2 AB+AG)
HIV 1/2 Antibodies: NONREACTIVE
HIV-1 P24 Antigen - HIV24: NONREACTIVE

## 2023-04-26 LAB — PROTIME-INR
INR: 0.9 (ref 0.8–1.2)
Prothrombin Time: 12.5 s (ref 11.4–15.2)

## 2023-04-26 LAB — HEPATITIS C ANTIBODY: HCV Ab: NONREACTIVE

## 2023-04-26 LAB — APTT: aPTT: 25 s (ref 24–36)

## 2023-04-28 MED ORDER — FAMOTIDINE 20 MG PO TABS
20.0000 mg | ORAL_TABLET | Freq: Once | ORAL | Status: AC
  Administered 2023-04-29: 20 mg via ORAL

## 2023-04-28 MED ORDER — ENOXAPARIN SODIUM 40 MG/0.4ML IJ SOSY
40.0000 mg | PREFILLED_SYRINGE | INTRAMUSCULAR | Status: AC
  Administered 2023-04-29: 40 mg via SUBCUTANEOUS

## 2023-04-28 MED ORDER — CELECOXIB 200 MG PO CAPS
400.0000 mg | ORAL_CAPSULE | ORAL | Status: AC
  Administered 2023-04-29: 400 mg via ORAL

## 2023-04-28 MED ORDER — CEFAZOLIN SODIUM-DEXTROSE 2-4 GM/100ML-% IV SOLN
2.0000 g | INTRAVENOUS | Status: AC
  Administered 2023-04-29: 2 g via INTRAVENOUS

## 2023-04-28 MED ORDER — POVIDONE-IODINE 10 % EX SWAB: 2.0000 | Freq: Once | CUTANEOUS | Status: DC

## 2023-04-28 MED ORDER — CHLORHEXIDINE GLUCONATE 0.12 % MT SOLN
15.0000 mL | Freq: Once | OROMUCOSAL | Status: AC
  Administered 2023-04-29: 15 mL via OROMUCOSAL

## 2023-04-28 MED ORDER — GABAPENTIN 300 MG PO CAPS
300.0000 mg | ORAL_CAPSULE | ORAL | Status: AC
  Administered 2023-04-29: 300 mg via ORAL

## 2023-04-28 MED ORDER — ORAL CARE MOUTH RINSE: 15.0000 mL | Freq: Once | OROMUCOSAL | Status: AC

## 2023-04-28 MED ORDER — ACETAMINOPHEN 500 MG PO TABS
1000.0000 mg | ORAL_TABLET | ORAL | Status: AC
  Administered 2023-04-29: 1000 mg via ORAL

## 2023-04-28 MED ORDER — LACTATED RINGERS IV SOLN: INTRAVENOUS | Status: DC

## 2023-04-29 ENCOUNTER — Ambulatory Visit: Payer: Medicaid Other | Admitting: Certified Registered Nurse Anesthetist

## 2023-04-29 ENCOUNTER — Encounter
Admission: RE | Disposition: A | Discharge status: Home / Self Care | Payer: Self-pay | Source: Home / Self Care | Attending: Obstetrics and Gynecology

## 2023-04-29 ENCOUNTER — Ambulatory Visit: Payer: Medicaid Other | Admitting: Occupational Therapy

## 2023-04-29 ENCOUNTER — Ambulatory Visit: Payer: Medicaid Other | Admitting: Urgent Care

## 2023-04-29 ENCOUNTER — Other Ambulatory Visit: Payer: Self-pay

## 2023-04-29 ENCOUNTER — Encounter: Payer: Self-pay | Admitting: Obstetrics and Gynecology

## 2023-04-29 ENCOUNTER — Ambulatory Visit
Admission: RE | Admit: 2023-04-29 | Discharge: 2023-04-29 | Disposition: A | Discharge status: Home / Self Care | Payer: Medicaid Other | Attending: Obstetrics and Gynecology | Admitting: Obstetrics and Gynecology

## 2023-04-29 DIAGNOSIS — D6851 Activated protein C resistance: Secondary | ICD-10-CM

## 2023-04-29 DIAGNOSIS — Z9079 Acquired absence of other genital organ(s): Secondary | ICD-10-CM | POA: Insufficient documentation

## 2023-04-29 DIAGNOSIS — N83201 Unspecified ovarian cyst, right side: Secondary | ICD-10-CM | POA: Diagnosis not present

## 2023-04-29 DIAGNOSIS — F329 Major depressive disorder, single episode, unspecified: Secondary | ICD-10-CM | POA: Diagnosis not present

## 2023-04-29 DIAGNOSIS — D259 Leiomyoma of uterus, unspecified: Secondary | ICD-10-CM

## 2023-04-29 DIAGNOSIS — I82521 Chronic embolism and thrombosis of right iliac vein: Secondary | ICD-10-CM

## 2023-04-29 DIAGNOSIS — N879 Dysplasia of cervix uteri, unspecified: Secondary | ICD-10-CM | POA: Insufficient documentation

## 2023-04-29 DIAGNOSIS — Z86711 Personal history of pulmonary embolism: Secondary | ICD-10-CM | POA: Insufficient documentation

## 2023-04-29 DIAGNOSIS — F32A Depression, unspecified: Secondary | ICD-10-CM | POA: Insufficient documentation

## 2023-04-29 DIAGNOSIS — D252 Subserosal leiomyoma of uterus: Secondary | ICD-10-CM | POA: Diagnosis not present

## 2023-04-29 DIAGNOSIS — D6862 Lupus anticoagulant syndrome: Secondary | ICD-10-CM | POA: Diagnosis not present

## 2023-04-29 DIAGNOSIS — N888 Other specified noninflammatory disorders of cervix uteri: Secondary | ICD-10-CM | POA: Diagnosis not present

## 2023-04-29 DIAGNOSIS — Z86718 Personal history of other venous thrombosis and embolism: Secondary | ICD-10-CM | POA: Diagnosis not present

## 2023-04-29 DIAGNOSIS — E66813 Obesity, class 3: Secondary | ICD-10-CM | POA: Insufficient documentation

## 2023-04-29 DIAGNOSIS — Z85828 Personal history of other malignant neoplasm of skin: Secondary | ICD-10-CM | POA: Insufficient documentation

## 2023-04-29 DIAGNOSIS — N9489 Other specified conditions associated with female genital organs and menstrual cycle: Secondary | ICD-10-CM | POA: Diagnosis not present

## 2023-04-29 DIAGNOSIS — Z9889 Other specified postprocedural states: Secondary | ICD-10-CM | POA: Insufficient documentation

## 2023-04-29 DIAGNOSIS — Z01818 Encounter for other preprocedural examination: Secondary | ICD-10-CM

## 2023-04-29 DIAGNOSIS — N8301 Follicular cyst of right ovary: Secondary | ICD-10-CM | POA: Insufficient documentation

## 2023-04-29 DIAGNOSIS — Z6841 Body Mass Index (BMI) 40.0 and over, adult: Secondary | ICD-10-CM | POA: Diagnosis not present

## 2023-04-29 DIAGNOSIS — Z01812 Encounter for preprocedural laboratory examination: Secondary | ICD-10-CM

## 2023-04-29 DIAGNOSIS — F419 Anxiety disorder, unspecified: Secondary | ICD-10-CM | POA: Insufficient documentation

## 2023-04-29 DIAGNOSIS — Z7901 Long term (current) use of anticoagulants: Secondary | ICD-10-CM | POA: Diagnosis not present

## 2023-04-29 DIAGNOSIS — N939 Abnormal uterine and vaginal bleeding, unspecified: Secondary | ICD-10-CM

## 2023-04-29 DIAGNOSIS — N920 Excessive and frequent menstruation with regular cycle: Secondary | ICD-10-CM

## 2023-04-29 HISTORY — PX: ROBOTIC ASSISTED TOTAL HYSTERECTOMY WITH BILATERAL SALPINGO OOPHERECTOMY: SHX6086

## 2023-04-29 LAB — NO BLOOD PRODUCTS

## 2023-04-29 LAB — POCT PREGNANCY, URINE: Preg Test, Ur: NEGATIVE

## 2023-04-29 SURGERY — HYSTERECTOMY, TOTAL, ROBOT-ASSISTED, LAPAROSCOPIC, WITH BILATERAL SALPINGO-OOPHORECTOMY
Anesthesia: General | Site: Pelvis

## 2023-04-29 MED ORDER — SODIUM CHLORIDE 0.9% FLUSH: 3.0000 mL | Freq: Two times a day (BID) | INTRAVENOUS | Status: DC

## 2023-04-29 MED ORDER — ONDANSETRON HCL 4 MG/2ML IJ SOLN
INTRAMUSCULAR | Status: DC | PRN
  Administered 2023-04-29: 4 mg via INTRAVENOUS

## 2023-04-29 MED ORDER — ROCURONIUM BROMIDE 100 MG/10ML IV SOLN
INTRAVENOUS | Status: DC | PRN
  Administered 2023-04-29: 30 mg via INTRAVENOUS
  Administered 2023-04-29: 50 mg via INTRAVENOUS
  Administered 2023-04-29: 10 mg via INTRAVENOUS

## 2023-04-29 MED ORDER — LIDOCAINE HCL (PF) 2 % IJ SOLN
INTRAMUSCULAR | Status: AC
  Filled 2023-04-29: qty 5

## 2023-04-29 MED ORDER — OXYCODONE HCL 5 MG PO TABS
5.0000 mg | ORAL_TABLET | Freq: Four times a day (QID) | ORAL | 0 refills | Status: DC | PRN
Start: 2023-04-29 — End: 2023-05-07

## 2023-04-29 MED ORDER — HYDROMORPHONE HCL 1 MG/ML IJ SOLN
INTRAMUSCULAR | Status: AC
  Filled 2023-04-29: qty 1

## 2023-04-29 MED ORDER — MIDAZOLAM HCL 2 MG/2ML IJ SOLN
INTRAMUSCULAR | Status: DC | PRN
  Administered 2023-04-29: 2 mg via INTRAVENOUS

## 2023-04-29 MED ORDER — SUGAMMADEX SODIUM 200 MG/2ML IV SOLN
INTRAVENOUS | Status: DC | PRN
  Administered 2023-04-29: 167 mg via INTRAVENOUS

## 2023-04-29 MED ORDER — SODIUM CHLORIDE 0.9 % IR SOLN
Status: DC | PRN
Start: 2023-04-29 — End: 2023-04-29
  Administered 2023-04-29: 100 mL

## 2023-04-29 MED ORDER — GLYCOPYRROLATE 0.2 MG/ML IJ SOLN
INTRAMUSCULAR | Status: AC
  Filled 2023-04-29: qty 1

## 2023-04-29 MED ORDER — DROPERIDOL 2.5 MG/ML IJ SOLN
0.6250 mg | Freq: Once | INTRAMUSCULAR | Status: AC
  Administered 2023-04-29: 0.625 mg via INTRAVENOUS

## 2023-04-29 MED ORDER — DEXAMETHASONE SODIUM PHOSPHATE 10 MG/ML IJ SOLN
INTRAMUSCULAR | Status: DC | PRN
  Administered 2023-04-29: 10 mg via INTRAVENOUS

## 2023-04-29 MED ORDER — CHLORHEXIDINE GLUCONATE 0.12 % MT SOLN
OROMUCOSAL | Status: AC
  Filled 2023-04-29: qty 15

## 2023-04-29 MED ORDER — DROPERIDOL 2.5 MG/ML IJ SOLN
INTRAMUSCULAR | Status: AC
  Filled 2023-04-29: qty 2

## 2023-04-29 MED ORDER — ONDANSETRON HCL 4 MG/2ML IJ SOLN
INTRAMUSCULAR | Status: AC
  Filled 2023-04-29: qty 2

## 2023-04-29 MED ORDER — BUPIVACAINE HCL (PF) 0.5 % IJ SOLN
INTRAMUSCULAR | Status: AC
  Filled 2023-04-29: qty 30

## 2023-04-29 MED ORDER — FENTANYL CITRATE (PF) 100 MCG/2ML IJ SOLN
INTRAMUSCULAR | Status: AC
  Filled 2023-04-29: qty 2

## 2023-04-29 MED ORDER — LIDOCAINE HCL (CARDIAC) PF 100 MG/5ML IV SOSY
PREFILLED_SYRINGE | INTRAVENOUS | Status: DC | PRN
  Administered 2023-04-29: 80 mg via INTRAVENOUS

## 2023-04-29 MED ORDER — SUCCINYLCHOLINE CHLORIDE 200 MG/10ML IV SOSY
PREFILLED_SYRINGE | INTRAVENOUS | Status: DC | PRN
  Administered 2023-04-29: 100 mg via INTRAVENOUS

## 2023-04-29 MED ORDER — SIMETHICONE 80 MG PO TABS: 1.0000 | ORAL_TABLET | Freq: Four times a day (QID) | ORAL | 1 refills | Status: DC | PRN

## 2023-04-29 MED ORDER — ROCURONIUM BROMIDE 10 MG/ML (PF) SYRINGE
PREFILLED_SYRINGE | INTRAVENOUS | Status: AC
  Filled 2023-04-29: qty 10

## 2023-04-29 MED ORDER — BUPIVACAINE HCL 0.5 % IJ SOLN
INTRAMUSCULAR | Status: DC | PRN
  Administered 2023-04-29: 20 mL

## 2023-04-29 MED ORDER — CELECOXIB 200 MG PO CAPS
ORAL_CAPSULE | ORAL | Status: AC
  Filled 2023-04-29: qty 2

## 2023-04-29 MED ORDER — LACTATED RINGERS IV SOLN: 125.0000 mL/h | INTRAVENOUS | Status: DC

## 2023-04-29 MED ORDER — DOCUSATE SODIUM 100 MG PO CAPS: 100.0000 mg | ORAL_CAPSULE | Freq: Two times a day (BID) | ORAL | 2 refills | Status: DC | PRN

## 2023-04-29 MED ORDER — FENTANYL CITRATE (PF) 100 MCG/2ML IJ SOLN
INTRAMUSCULAR | Status: DC | PRN
  Administered 2023-04-29 (×2): 50 ug via INTRAVENOUS

## 2023-04-29 MED ORDER — MIDAZOLAM HCL 2 MG/2ML IJ SOLN
INTRAMUSCULAR | Status: AC
  Filled 2023-04-29: qty 2

## 2023-04-29 MED ORDER — OXYCODONE HCL 5 MG PO TABS
5.0000 mg | ORAL_TABLET | Freq: Once | ORAL | Status: AC | PRN
  Administered 2023-04-29: 5 mg via ORAL

## 2023-04-29 MED ORDER — DEXAMETHASONE SODIUM PHOSPHATE 10 MG/ML IJ SOLN
INTRAMUSCULAR | Status: AC
  Filled 2023-04-29: qty 1

## 2023-04-29 MED ORDER — FAMOTIDINE 20 MG PO TABS
ORAL_TABLET | ORAL | Status: AC
  Filled 2023-04-29: qty 1

## 2023-04-29 MED ORDER — PROPOFOL 10 MG/ML IV BOLUS
INTRAVENOUS | Status: AC
  Filled 2023-04-29: qty 20

## 2023-04-29 MED ORDER — ACETAMINOPHEN 500 MG PO TABS
ORAL_TABLET | ORAL | Status: AC
  Filled 2023-04-29: qty 2

## 2023-04-29 MED ORDER — ENOXAPARIN SODIUM 40 MG/0.4ML IJ SOSY
PREFILLED_SYRINGE | INTRAMUSCULAR | Status: AC
  Filled 2023-04-29: qty 0.4

## 2023-04-29 MED ORDER — PHENYLEPHRINE 80 MCG/ML (10ML) SYRINGE FOR IV PUSH (FOR BLOOD PRESSURE SUPPORT)
PREFILLED_SYRINGE | INTRAVENOUS | Status: DC | PRN
  Administered 2023-04-29: 80 ug via INTRAVENOUS

## 2023-04-29 MED ORDER — GABAPENTIN 300 MG PO CAPS
ORAL_CAPSULE | ORAL | Status: AC
  Filled 2023-04-29: qty 1

## 2023-04-29 MED ORDER — CEFAZOLIN SODIUM-DEXTROSE 2-4 GM/100ML-% IV SOLN
INTRAVENOUS | Status: AC
  Filled 2023-04-29: qty 100

## 2023-04-29 MED ORDER — HYDROMORPHONE HCL 1 MG/ML IJ SOLN
INTRAMUSCULAR | Status: DC | PRN
Start: 2023-04-29 — End: 2023-04-29
  Administered 2023-04-29 (×2): .5 mg via INTRAVENOUS

## 2023-04-29 MED ORDER — ACETAMINOPHEN 500 MG PO TABS: 1000.0000 mg | ORAL_TABLET | Freq: Four times a day (QID) | ORAL | 1 refills | Status: DC | PRN

## 2023-04-29 MED ORDER — OXYCODONE HCL 5 MG/5ML PO SOLN: 5.0000 mg | Freq: Once | ORAL | Status: AC | PRN

## 2023-04-29 MED ORDER — 0.9 % SODIUM CHLORIDE (POUR BTL) OPTIME
TOPICAL | Status: DC | PRN
  Administered 2023-04-29: 500 mL

## 2023-04-29 MED ORDER — PROPOFOL 10 MG/ML IV BOLUS
INTRAVENOUS | Status: DC | PRN
  Administered 2023-04-29: 160 mg via INTRAVENOUS

## 2023-04-29 MED ORDER — HYDROMORPHONE HCL 1 MG/ML IJ SOLN
0.2500 mg | INTRAMUSCULAR | Status: DC | PRN
  Administered 2023-04-29 (×2): 0.25 mg via INTRAVENOUS

## 2023-04-29 MED ORDER — OXYCODONE HCL 5 MG PO TABS
ORAL_TABLET | ORAL | Status: AC
  Filled 2023-04-29: qty 1

## 2023-04-29 SURGICAL SUPPLY — 72 items
ADH SKN CLS APL DERMABOND .7 (GAUZE/BANDAGES/DRESSINGS) ×1
BAG DRN RND TRDRP ANRFLXCHMBR (UROLOGICAL SUPPLIES) ×1
BAG URINE DRAIN 2000ML AR STRL (UROLOGICAL SUPPLIES) ×1 IMPLANT
BLADE SURG SZ11 CARB STEEL (BLADE) ×1 IMPLANT
CANNULA CAP OBTURATR AIRSEAL 8 (CAP) ×1 IMPLANT
CATH FOLEY 2WAY 5CC 16FR (CATHETERS) ×1
CATH URTH 16FR FL 2W BLN LF (CATHETERS) ×1 IMPLANT
COVER TIP SHEARS 8 DVNC (MISCELLANEOUS) ×1 IMPLANT
COVER WAND RF STERILE (DRAPES) IMPLANT
DERMABOND ADVANCED .7 DNX12 (GAUZE/BANDAGES/DRESSINGS) ×1 IMPLANT
DRAPE ARM DVNC X/XI (DISPOSABLE) ×4 IMPLANT
DRAPE COLUMN DVNC XI (DISPOSABLE) ×1 IMPLANT
DRAPE ROBOT W/ LEGGING 30X125 (DRAPES) ×1 IMPLANT
DRIVER NDL LRG 8 DVNC XI (INSTRUMENTS) ×1 IMPLANT
DRIVER NDLE LRG 8 DVNC XI (INSTRUMENTS) ×1
ELECT REM PT RETURN 9FT ADLT (ELECTROSURGICAL) ×1
ELECTRODE REM PT RTRN 9FT ADLT (ELECTROSURGICAL) ×1 IMPLANT
FORCEPS BPLR FENES DVNC XI (FORCEP) ×1 IMPLANT
FORCEPS BPLR R/ABLATION 8 DVNC (INSTRUMENTS) ×1 IMPLANT
GAUZE 4X4 16PLY ~~LOC~~+RFID DBL (SPONGE) ×1 IMPLANT
GLOVE BIO SURGEON STRL SZ 6.5 (GLOVE) ×2 IMPLANT
GLOVE INDICATOR 7.0 STRL GRN (GLOVE) ×3 IMPLANT
GLOVE PI ORTHO PRO STRL 7.5 (GLOVE) IMPLANT
GOWN STRL REUS W/ TWL LRG LVL3 (GOWN DISPOSABLE) ×3 IMPLANT
GOWN STRL REUS W/TWL LRG LVL3 (GOWN DISPOSABLE) ×3
GRASPER SUT TROCAR 14GX15 (MISCELLANEOUS) ×1 IMPLANT
HEMOSTAT SURGICEL 2X3 (HEMOSTASIS) IMPLANT
IRRIGATION STRYKERFLOW (MISCELLANEOUS) IMPLANT
IRRIGATOR STRYKERFLOW (MISCELLANEOUS) ×1
IV NS 1000ML (IV SOLUTION) ×1
IV NS 1000ML BAXH (IV SOLUTION) IMPLANT
KIT PINK PAD W/HEAD ARE REST (MISCELLANEOUS) ×1
KIT PINK PAD W/HEAD ARM REST (MISCELLANEOUS) ×1 IMPLANT
LABEL OR SOLS (LABEL) ×1 IMPLANT
MANIFOLD NEPTUNE II (INSTRUMENTS) ×1 IMPLANT
MANIPULATOR VCARE LG CRV RETR (MISCELLANEOUS) IMPLANT
MANIPULATOR VCARE SML CRV RETR (MISCELLANEOUS) IMPLANT
MANIPULATOR VCARE STD CRV RETR (MISCELLANEOUS) IMPLANT
NDL INSUFFLATION 14GA 120MM (NEEDLE) ×1 IMPLANT
NEEDLE INSUFFLATION 14GA 120MM (NEEDLE) ×1
NS IRRIG 1000ML POUR BTL (IV SOLUTION) ×1 IMPLANT
OBTURATOR OPTICAL STND 8 DVNC (TROCAR) ×1
OBTURATOR OPTICALSTD 8 DVNC (TROCAR) ×1 IMPLANT
OCCLUDER COLPOPNEUMO (BALLOONS) ×1 IMPLANT
PACK GYN LAPAROSCOPIC (MISCELLANEOUS) ×1 IMPLANT
PORT ACCESS TROCAR AIRSEAL 12 (TROCAR) IMPLANT
PORT ACCESS TROCAR AIRSEAL 5 (TROCAR) IMPLANT
SCISSORS MNPLR CVD DVNC XI (INSTRUMENTS) ×1 IMPLANT
SCRUB CHG 4% DYNA-HEX 4OZ (MISCELLANEOUS) ×1 IMPLANT
SEAL UNIV 5-12 XI (MISCELLANEOUS) ×3 IMPLANT
SEALER VESSEL EXT DVNC XI (MISCELLANEOUS) IMPLANT
SET CYSTO W/LG BORE CLAMP LF (SET/KITS/TRAYS/PACK) IMPLANT
SET TRI-LUMEN FLTR TB AIRSEAL (TUBING) IMPLANT
SET TUBE FILTERED XL AIRSEAL (SET/KITS/TRAYS/PACK) IMPLANT
SOL ELECTROSURG ANTI STICK (MISCELLANEOUS) ×1
SOL PREP PVP 2OZ (MISCELLANEOUS) ×1
SOLUTION ELECTROSURG ANTI STCK (MISCELLANEOUS) ×1 IMPLANT
SOLUTION PREP PVP 2OZ (MISCELLANEOUS) ×1 IMPLANT
SURGILUBE 2OZ TUBE FLIPTOP (MISCELLANEOUS) ×1 IMPLANT
SUT DVC VLOC 180 0 12IN GS21 (SUTURE) ×1
SUT MNCRL 4-0 (SUTURE) ×1
SUT MNCRL 4-0 27XMFL (SUTURE) ×1
SUT STRATAFIX SPIRAL PDS+ 0 30 (SUTURE) IMPLANT
SUT VIC AB 2-0 CT1 27 (SUTURE) ×1
SUT VIC AB 2-0 CT1 TAPERPNT 27 (SUTURE) ×1 IMPLANT
SUT VIC AB 4-0 PS2 27 (SUTURE) IMPLANT
SUT VICRYL 0 UR6 27IN ABS (SUTURE) ×1 IMPLANT
SUTURE DVC VLC 180 0 12IN GS21 (SUTURE) ×1 IMPLANT
SUTURE MNCRL 4-0 27XMF (SUTURE) ×1 IMPLANT
SYR 10ML LL (SYRINGE) ×1 IMPLANT
SYR 50ML LL SCALE MARK (SYRINGE) ×1 IMPLANT
WATER STERILE IRR 500ML POUR (IV SOLUTION) ×1 IMPLANT

## 2023-04-29 NOTE — Anesthesia Procedure Notes (Signed)
Procedure Name: Intubation Date/Time: 04/29/2023 7:45 AM  Performed by: Malva Cogan, CRNAPre-anesthesia Checklist: Patient identified, Patient being monitored, Timeout performed, Emergency Drugs available and Suction available Patient Re-evaluated:Patient Re-evaluated prior to induction Oxygen Delivery Method: Circle system utilized Preoxygenation: Pre-oxygenation with 100% oxygen Induction Type: IV induction Ventilation: Mask ventilation without difficulty Laryngoscope Size: 3 and McGraph Grade View: Grade I Tube type: Oral Tube size: 7.0 mm Number of attempts: 1 Airway Equipment and Method: Stylet Placement Confirmation: ETT inserted through vocal cords under direct vision, positive ETCO2 and breath sounds checked- equal and bilateral Secured at: 21 cm Tube secured with: Tape Dental Injury: Teeth and Oropharynx as per pre-operative assessment

## 2023-04-29 NOTE — Transfer of Care (Signed)
Immediate Anesthesia Transfer of Care Note  Patient: Caroline Madden  Procedure(s) Performed: XI ROBOTIC-ASSISTED TOTAL LAPAROSCOPIC HYSTERECTOMY WITH BILATERAL SALPINGECTOMY (Pelvis)  Patient Location: PACU  Anesthesia Type:General  Level of Consciousness: awake, alert , and oriented  Airway & Oxygen Therapy: Patient Spontanous Breathing and Patient connected to face mask oxygen  Post-op Assessment: Report given to RN and Post -op Vital signs reviewed and stable  Post vital signs: Reviewed and stable  Last Vitals:  Vitals Value Taken Time  BP 135/88 04/29/23 1057  Temp    Pulse 84 04/29/23 1100  Resp 32 04/29/23 1100  SpO2 97 % 04/29/23 1100  Vitals shown include unfiled device data.  Last Pain:  Vitals:   04/29/23 0621  TempSrc: Temporal  PainSc: 0-No pain         Complications: No notable events documented.

## 2023-04-29 NOTE — Anesthesia Preprocedure Evaluation (Addendum)
Anesthesia Evaluation  Patient identified by MRN, date of birth, ID band Patient awake    Reviewed: Allergy & Precautions, NPO status , Patient's Chart, lab work & pertinent test results  Airway Mallampati: II  TM Distance: >3 FB Neck ROM: full    Dental no notable dental hx.    Pulmonary neg pulmonary ROS, neg shortness of breath, neg COPD   Pulmonary exam normal        Cardiovascular + DVT (hx of PE on eliquis)  Normal cardiovascular exam     Neuro/Psych  Headaches PSYCHIATRIC DISORDERS Anxiety Depression       GI/Hepatic negative GI ROS, Neg liver ROS,,,  Endo/Other    Morbid obesity  Renal/GU      Musculoskeletal   Abdominal   Peds  Hematology negative hematology ROS (+) Factor V Leiden on Eliquis   Anesthesia Other Findings Past Medical History: No date: Anemia No date: Anxiety 2002: DVT (deep venous thrombosis) (HCC) No date: Factor V Leiden (HCC)     Comment:  a.) on chronic DOAC therapy (apixaban) No date: History of kidney stones No date: Lymphedema     Comment:  right leg-uses lymphedema pump No date: MDD (major depressive disorder) No date: Migraines No date: On apixaban therapy No date: PE (pulmonary thromboembolism) (HCC) No date: Skin cancer  Past Surgical History: 08/20/2019: CYST EXCISION     Comment:  Scalp No date: GANGLION CYST EXCISION No date: IR FALLOPIAN TUBE CATHETERIZATION; N/A     Comment:  removal per pt. of 1 fallopian tube No date: IVC FILTER INSERTION 09/27/2020: LAPAROSCOPIC OVARIAN CYSTECTOMY; Left     Comment:  Procedure: LAPAROSCOPIC OVARIAN CYSTECTOMY;  Surgeon:               Nadara Mustard, MD;  Location: [facility] ORS;  Service:               Gynecology;  Laterality: Left; 09/27/2020: LAPAROSCOPIC UNILATERAL SALPINGECTOMY; Left     Comment:  Procedure: LAPAROSCOPIC UNILATERAL SALPINGECTOMY;                Surgeon: Nadara Mustard, MD;  Location: [facility] ORS;                 Service: Gynecology;  Laterality: Left; 11/07/2021: PERIPHERAL VASCULAR THROMBECTOMY; Right     Comment:  Procedure: PERIPHERAL VASCULAR THROMBECTOMY;  Surgeon:               Renford Dills, MD;  Location: [facility];               Service: Cardiovascular;  Laterality: Right; No date: WISDOM TOOTH EXTRACTION   BMI 46    Reproductive/Obstetrics negative OB ROS                             Anesthesia Physical Anesthesia Plan  ASA: 3  Anesthesia Plan: General ETT   Post-op Pain Management: Tylenol PO (pre-op)*, Celebrex PO (pre-op)*, Gabapentin PO (pre-op)* and Ketamine IV*   Induction: Intravenous  PONV Risk Score and Plan: 3 and Ondansetron, Dexamethasone, Midazolam and Treatment may vary due to age or medical condition  Airway Management Planned: Oral ETT  Additional Equipment:   Intra-op Plan:   Post-operative Plan: Extubation in OR  Informed Consent: I have reviewed the patients History and Physical, chart, labs and discussed the procedure including the risks, benefits and alternatives for the proposed anesthesia with the patient or authorized representative  who has indicated his/her understanding and acceptance.     Dental Advisory Given  Plan Discussed with: Anesthesiologist, CRNA and Surgeon  Anesthesia Plan Comments: (Patient consented for risks of anesthesia including but not limited to:  - adverse reactions to medications - damage to eyes, teeth, lips or other oral mucosa - nerve damage due to positioning  - sore throat or hoarseness - Damage to heart, brain, nerves, lungs, other parts of body or loss of life  Patient voiced understanding and assent.)        Anesthesia Quick Evaluation

## 2023-04-29 NOTE — Op Note (Addendum)
Procedure(s): XI ROBOTIC-ASSISTED TOTAL LAPAROSCOPIC HYSTERECTOMY WITH BILATERAL SALPINGECTOMY Procedure Note  Caroline Madden female 42 y.o. 04/29/2023  Indications: The patient is a 42 y.o. [id number] female with abnormal uterine bleeding, history of endometrial ablation 10 years ago. ALso wit ha h/o Factor V Leiden deficiency and DVT, on long-term anticoagulation (over 20 years). Also with benign follicular cyst o right ovary. Also with history of previous partial left salpingectomy.  Pre-operative Diagnosis: Abnormal uterine bleeding, h/o prior endometrial ablation, Factor V Leiden deficiency and DVT, on long-term anticoagulation. Right ovarian follicular cyst. Partial left salpingectomy  Post-operative Diagnosis: Same  Surgeon: Hildred Laser, MD  Assistants:  Waymon Amato, RNFA    Anesthesia: General endotracheal anesthesia  Findings: The uterus was sounded to 7 cm.   Uterus with multiple small subserosal fibroids and a serosal cyst.  Right fallopian tube appears normal. Left fallopian tube with proximal segment present, distal segment (fimbriated end) surgically absent. Ovaries appeared normal, with exception of small follicular cyst on right ovary. Small fluid-filled cyst noted on right round ligament.   Procedure Details: The patient was seen in the Holding Room. The risks, benefits, complications, treatment options, and expected outcomes were discussed with the patient.  The patient concurred with the proposed plan, giving informed consent.  The site of surgery properly noted/marked. The patient was taken to the Operating Room, identified as Caroline Madden and the procedure verified as Procedure(s) (LRB): XI ROBOTIC-ASSISTED TOTAL LAPAROSCOPIC HYSTERECTOMY WITH BILATERAL SALPINGECTOMY (N/A). A Time Out was held and the above information confirmed.  She was then placed under general anesthesia without difficulty. She was placed in the dorsal lithotomy position, and was prepped and  draped in a sterile manner.  Foley catheter was placed.  A sterile speculum was placed into the vagina.  The cervix was grasped with a single-tooth tenaculum and the uterus was sounded to 7 cm. A medium V-care device was then inserted without difficulty.  A vaginal  balloon occluder was then placed inside the vagina to help with pneumoperitoneum.    Attention was then turned to the abdomen, where  an 8 mm incision was made at the umbilicus.  The Veress needle was passed and a pneumoperitoneum was established.  The Veress needle was then removed and an 8 mm port was placed supraumbilically.  The daVinci camera was then placed supraumbilically. Three more ports were then placed. There were two 8 mm ports that were placed 10 cm laterally to the umbilicus and 2 cm inferiorly on either side.  The 5- mm assistant port was then placed in the right lower quadrant 2 cm medial and superiorr to the iliac crest. The daVinci robot was then docked in the normal fashion. The patient was placed in steep Trendelenburg positioning.  Inspection of the pelvis showed the findings above. The right mesosalpinx of the fallopian tube was cauterized and cut using the vessel sealer device.  The utero-ovarian ligament was also coagulated and cut. The round ligament was coagulated and cut. A bladder flap was created and the bladder was dissected down from the cervix.This entire procedure was then repeated on the left side. The follicular cyst on the right was ruptured with clear fluid drained.   The uterine arteries were then skeletonized, and cauterized using the vessel sealer device.  The V-care cup was then identified and an incision was made in the cervicovaginal junction posteriorly at the vaginal cuff using the monopolar endoshears. This incision was extended laterally on both sides.  The top of the  cervicovaginal cuff was then incised after identification of the cup, and the incisions were then extended laterally until the uterus  was free from the surrounding vagina. The uterus was then delivered posteriorly through the vagina using the robotic assistant.   There was a pumping vessel noted after removal of the uterus on the vaginal cuff. This vessel was cauterized using the fenestrated bipolar forceps.Hemostasis was achieved. The vaginal cuff was closed with a running suture of 0 Vicryl V-lock. The ureters were identified bilaterally. The entire pelvis was hemostatic.  Surgicele was placed over the vaginal cuff.  The insufflation pressure was then decreased to assess for any occult bleeding. Hemostasis was noted throughout.  The vaginal occluder balloon and the foley catheter were then removed.   All instruments were removed from the abdomen, and the robot was then undocked.  All incisions were closed with a 4-0 Vicryl in a subcuticular stitch.  All incisions were injected with local anesthetic (Sensorcaine 0.5%, total of 20 cc).  Dermabond was placed over all incisions.  The final needle, sponge, and instrument count was correct x 2. The patient tolerated the procedure well. Patient to the recovery room in good condition.   An experienced assistant was required given the standard of surgical care given the complexity of the case.  This assistant was needed for exposure, dissection, suctioning, retraction, instrument exchange, and for overall help during the procedure.   Estimated Blood Loss:  90 ml      Drains: foley catheterization prior to procedure with  200 ml of clear urine at the end of the case.         Total IV Fluids:  900 ml  Specimens: Uterus with cervix, bilateral fallopian tubes         Implants: None         Complications:  None; patient tolerated the procedure well.         Disposition: PACU - hemodynamically stable.         Condition: stable   Hildred Laser, MD  OB/GYN at [facility]

## 2023-04-29 NOTE — Anesthesia Postprocedure Evaluation (Signed)
Anesthesia Post Note  Patient: Caroline Madden  Procedure(s) Performed: XI ROBOTIC-ASSISTED TOTAL LAPAROSCOPIC HYSTERECTOMY WITH BILATERAL SALPINGECTOMY (Pelvis)  Patient location during evaluation: PACU Anesthesia Type: General Level of consciousness: awake and alert Pain management: pain level controlled Vital Signs Assessment: post-procedure vital signs reviewed and stable Respiratory status: spontaneous breathing, nonlabored ventilation, respiratory function stable and patient connected to nasal cannula oxygen Cardiovascular status: blood pressure returned to baseline and stable Postop Assessment: no apparent nausea or vomiting Anesthetic complications: no   No notable events documented.   Last Vitals:  Vitals:   04/29/23 1130 04/29/23 1136  BP: (!) 142/91   Pulse: 81 84  Resp: 18 13  Temp:    SpO2: 93% 96%    Last Pain:  Vitals:   04/29/23 1136  TempSrc:   PainSc: 8                  Louie Boston

## 2023-04-29 NOTE — H&P (Signed)
GYNECOLOGY PREOPERATIVE HISTORY AND PHYSICAL   Subjective:  Caroline Madden is a 42 y.o. G1P1001 who desires surgical management of abnormal uterine bleeding.   She has a history of endometrial ablation over 10 years ago. Also with a history of Factor V Leiden deficiency and DVT, on long-term anticoagulation (over 20 years) She reports that after her procedure she would still have some bleeding or a period, however was typically light.  She gradually began resuming regular cycles, although were still fairly light.  However most recently, over the past 2 months she has begun Having 2 cycles per month, and they have become very painful, with passage of clot.  Cycles are lasting 3-5 days. Reports that approximately 2 years ago she had  a unilateral cyst and ovary removal, was planning on having a repeat endometrial ablation, however notes the procedure could not be completed.  Review of chart attributes this to significant cervical stenosis and fibroid uterus.  Patient was previously counseled on IUD placement vs repeat endometrial ablation, vs hysterectomy as options for management of her bleeding.  Patient has decided today on definitive therapy with hysterectomy.    Proposed surgery: Robotic Total Laparoscopic Hysterectomy with Bilateral Salpingectomy.     Pertinent Gynecological History: Menses:  period comes every 14 days lasting 3-4 days Bleeding: dysfunctional uterine bleeding Contraception: none Last mammogram: normal Date: 11/20/22 Last pap: normal Date: 08/19/2020.     Past Medical History:  Diagnosis Date   Anemia    Anxiety    DVT (deep venous thrombosis) (HCC) 2002   Factor V Leiden (HCC)    a.) on chronic DOAC therapy (apixaban)   History of kidney stones    Lymphedema    right leg-uses lymphedema pump   MDD (major depressive disorder)    Migraines    On apixaban therapy    PE (pulmonary thromboembolism) (HCC)    Skin cancer     Past Surgical History:  Procedure  Laterality Date   CYST EXCISION  08/20/2019   Scalp   GANGLION CYST EXCISION     IR FALLOPIAN TUBE CATHETERIZATION N/A    removal per pt. of 1 fallopian tube   IVC FILTER INSERTION     LAPAROSCOPIC OVARIAN CYSTECTOMY Left 09/27/2020   Procedure: LAPAROSCOPIC OVARIAN CYSTECTOMY;  Surgeon: Nadara Mustard, MD;  Location: [facility] ORS;  Service: Gynecology;  Laterality: Left;   LAPAROSCOPIC UNILATERAL SALPINGECTOMY Left 09/27/2020   Procedure: LAPAROSCOPIC UNILATERAL SALPINGECTOMY;  Surgeon: Nadara Mustard, MD;  Location: [facility] OR[facility];  Service: Gynecology;  Laterality: Left;   PERIPHERAL VASCULAR THROMBECTOMY Right 11/07/2021   Procedure: PERIPHERAL VASCULAR THROMBECTOMY;  Surgeon: Renford Dills, MD;  Location: [facility] INVASIVE CV [facility];  Service: Cardiovascular;  Laterality: Right;   WISDOM TOOTH EXTRACTION      OB History  Gravida Para Term Preterm AB Living  1 1 1     1   SAB IAB Ectopic Multiple Live Births               # Outcome Date GA Lbr Len/2nd Weight Sex Type Anes PTL Lv  1 Term             Family History  Problem Relation Age of Onset   Lupus Mother    Stroke Father    Parkinson's disease Father    Hypertension Father    Gout Father    Colon cancer Maternal Grandmother    Stomach cancer Maternal Grandmother     Social History   Socioeconomic  History   Marital status: Single    Spouse name: Not on file   Number of children: 1   Years of education: Not on file   Highest education level: Not on file  Occupational History   Not on file  Tobacco Use   Smoking status: Never   Smokeless tobacco: Never  Vaping Use   Vaping status: Never Used  Substance and Sexual Activity   Alcohol use: No   Drug use: No   Sexual activity: Yes    Birth control/protection: None  Other Topics Concern   Not on file  Social History Narrative   Has a 27 y.o. daughter    Social Determinants of Health   Financial Resource Strain: Low Risk  (04/16/2023)   Received from [facility]   Overall Financial Resource Strain (CARDIA)    Difficulty of Paying Living Expenses: Not hard at all  Food Insecurity: No Food Insecurity (04/16/2023)   Received from [facility]   Hunger Vital Sign    Worried About Running Out of Food in the Last Year: Never true    Ran Out of Food in the Last Year: Never true  Transportation Needs: No Transportation Needs (04/16/2023)   Received from [facility] - Transportation    In the past 12 months, has lack of transportation kept you from medical appointments or from getting medications?: No    Lack of Transportation (Non-Medical): No  Physical Activity: Not on file  Stress: Not on file  Social Connections: Not on file  Intimate Partner Violence: Not on file    No current facility-administered medications on file prior to encounter.   Current Outpatient Medications on File Prior to Encounter  Medication Sig Dispense Refill   buPROPion (WELLBUTRIN XL) 150 MG 24 hr tablet Take 150 mg by mouth daily.     Clobetasol Propionate 0.05 % shampoo PLEASE SEE ATTACHED FOR DETAILED DIRECTIONS (Patient not taking: Reported on 04/04/2023)     ELIQUIS 5 MG TABS tablet TAKE 1 TABLET BY MOUTH TWICE PER DAY 60 tablet 6   fenofibrate (TRICOR) 48 MG tablet Take 48 mg by mouth daily.      Allergies  Allergen Reactions   Wheat Diarrhea    bloating   Latex Rash   Other Rash    Adhesives such as bandaids   Penicillin V Potassium Rash   Strawberry Extract Rash     Review of Systems Constitutional: No recent fever/chills/sweats Respiratory: No recent cough/bronchitis Cardiovascular: No chest pain Gastrointestinal: No recent nausea/vomiting/diarrhea Genitourinary: No UTI symptoms Hematologic/lymphatic:No history of coagulopathy or recent blood thinner use    Objective:   Blood pressure 137/78, pulse 99, temperature (!) 97.3 F (36.3 C), temperature source Temporal, resp. rate 16,  last menstrual period 03/20/2023, SpO2 93%. CONSTITUTIONAL: Well-developed, well-nourished female in no acute distress.  HENT:  Normocephalic, atraumatic, External right and left ear normal. Oropharynx is clear and moist EYES: Conjunctivae and EOM are normal. Pupils are equal, round, and reactive to light. No scleral icterus.  NECK: Normal range of motion, supple, no masses SKIN: Skin is warm and dry. No rash noted. Not diaphoretic. No erythema. No pallor. NEUROLOGIC: Alert and oriented to person, place, and time. Normal reflexes, muscle tone coordination. No cranial nerve deficit noted. PSYCHIATRIC: Normal mood and affect. Normal behavior. Normal judgment and thought content. CARDIOVASCULAR: Normal heart rate noted, regular rhythm RESPIRATORY: Effort and breath sounds normal, no problems with respiration noted ABDOMEN: Soft,  nontender, nondistended. PELVIC: Deferred MUSCULOSKELETAL: Normal range of motion. No edema and no tenderness. 2+ distal pulses.   Labs: Results for orders placed or performed during the hospital encounter of 04/29/23 (from the past 336 hour(s))  Pregnancy, urine POC   Collection Time: 04/29/23  6:15 AM  Result Value Ref Range   Preg Test, Ur NEGATIVE NEGATIVE  Results for orders placed or performed during the hospital encounter of 04/26/23 (from the past 336 hour(s))  CBC   Collection Time: 04/26/23 10:47 AM  Result Value Ref Range   WBC 6.1 4.0 - 10.5 K/uL   RBC 5.48 (H) 3.87 - 5.11 MIL/uL   Hemoglobin 16.8 (H) 12.0 - 15.0 g/dL   HCT 40.9 (H) 81.1 - 91.4 %   MCV 91.8 80.0 - 100.0 fL   MCH 30.7 26.0 - 34.0 pg   MCHC 33.4 30.0 - 36.0 g/dL   RDW 78.2 95.6 - 21.3 %   Platelets 283 150 - 400 K/uL   nRBC 0.0 0.0 - 0.2 %  Comprehensive metabolic panel   Collection Time: 04/26/23 10:47 AM  Result Value Ref Range   Sodium 137 135 - 145 mmol/L   Potassium 3.9 3.5 - 5.1 mmol/L   Chloride 101 98 - 111 mmol/L   CO2 25 22 - 32 mmol/L   Glucose, Bld 89 70 - 99 mg/dL    BUN 23 (H) 6 - 20 mg/dL   Creatinine, Ser 0.86 0.44 - 1.00 mg/dL   Calcium 9.5 8.9 - 57.8 mg/dL   Total Protein 7.7 6.5 - 8.1 g/dL   Albumin 4.3 3.5 - 5.0 g/dL   AST 17 15 - 41 U/L   ALT 16 0 - 44 U/L   Alkaline Phosphatase 50 38 - 126 U/L   Total Bilirubin 0.6 0.3 - 1.2 mg/dL   GFR, Estimated >46 >96 mL/min   Anion gap 11 5 - 15  APTT   Collection Time: 04/26/23 10:47 AM  Result Value Ref Range   aPTT 25 24 - 36 seconds  Protime-INR   Collection Time: 04/26/23 10:47 AM  Result Value Ref Range   Prothrombin Time 12.5 11.4 - 15.2 seconds   INR 0.9 0.8 - 1.2  Rapid HIV screen (HIV 1/2 Ab+Ag)   Collection Time: 04/26/23 10:47 AM  Result Value Ref Range   HIV-1 P24 Antigen - HIV24 NON REACTIVE NON REACTIVE   HIV 1/2 Antibodies NON REACTIVE NON REACTIVE   Interpretation (HIV Ag Ab)      A non reactive test result means that HIV 1 or HIV 2 antibodies and HIV 1 p24 antigen were not detected in the specimen.  Hepatitis C antibody   Collection Time: 04/26/23 10:47 AM  Result Value Ref Range   HCV Ab NON REACTIVE NON REACTIVE    Pathology (performed 04/05/2023):  A. ENDOMETRIUM, BIOPSY:  - Benign secretory endometrium  - Negative for hyperplasia or malignancy     Imaging Studies: US PELVIS TRANSVAGINAL NON-OB (TV ONLY) ULTRASOUND REPORT   Location: [facility] Date of Service: 04/04/2023        Indications:Abnormal Uterine Bleeding Findings:  The uterus is anteverted and measures 7.95 x 4.28 x 3.62 cm. Echo texture is homogenous with evidence of focal masses. Within the uterus are multiple suspected fibroids. The largest measuring: Fibroid 1:1.18 x 1.06 x 1.53 cm intramural  Fibroid 2:2.13 x 1.34 x 1.31 cm intramural     The Endometrium measures 3.27 mm.   Right Ovary measures 4.09 x 5.37  x 3.38  cm. It is not normal in  appearance. Two large simple cyst seen measuring :  1 - 2.75 x 2.99 x 3.12 cm 2 - 3.28 x 2.71 x 3.00 cm Left Ovary not well  seen Survey of the adnexa demonstrates no adnexal masses. There is no free fluid in the cul de sac.   Impression: 1. Two fibroids noted within the uterus 2. Two simple cysts seen within rt ov   Recommendations: 1.Clinical correlation with the patient's History and Physical Exam.   Waldo Laine, RT  I have reviewed this study and agree with documented findings.   Hildred Laser, MD [facility]    Assessment:   1. Pre-op examination   2. Abnormal uterine bleeding   3. Uterine leiomyoma, unspecified location   4. Right ovarian cyst   5. History of endometrial ablation   6. Lupus anticoagulant disorder (HCC)   7. Factor V Leiden (HCC)   8. History of DVT (deep vein thrombosis)   9. Class 3 severe obesity with body mass index (BMI) of 45.0 to 49.9 in adult, unspecified obesity type, unspecified whether serious comorbidity present (HCC)      Plan:   1. Pre-op examination - Patient desires definitive management with hysterectomy.  I proposed doing a robotic-assisted total laparoscopic hysterectomy (TLH) and prophylactic bilateral salpingectomy.  No  indication for oophorectomy.  Patient agrees with this proposed surgery.  The risks of surgery were discussed in detail with the patient including but not limited to: bleeding which may require transfusion or reoperation; infection which may require antibiotics; injury to bowel, bladder, ureters or other surrounding organs; need for additional procedures including laparotomy or subsequent procedures secondary to abnormal pathology; formation of adhesions; thromboembolic phenomenon; incisional problems and other postoperative/anesthesia complications.  Patient was also advised that she will likely be discharged home after her procedure, otherwise will admit overnight if any concerns arose postoperatively. Expected recovery time after a hysterectomy is 6-8 weeks.  Patient was told that the likelihood that her condition and symptoms will be  treated effectively with this surgical management was very high; the postoperative expectations were also discussed in detail. The patient also understands the alternative treatment options which were discussed in full. All questions were answered. - Preop testing reviewed. - Has been NPO since midnight.   2. Abnormal uterine bleeding -Patient with resumption of bleeding after history of endometrial ablation.  Has fibroids and chronic anticoagulant use which could be contributing factors. -Endometrial biopsy with benign endometrium, no concerns for hyperplasia or malignancy.   3. Uterine leiomyoma, unspecified location - Reviewed ultrasound results with patient. Small uterine fibroids noted.  May possibly be a cause of patient's bleeding. Small right ovarian cysts, simple.   4. Right ovarian cyst -Simple cyst noted on right ovary during most recent ultrasound.  Not concerning however can be drained at the time of surgical intervention.  5. History of endometrial ablation -Patient declines repeat ablation as she only received approximately 3 years of relief from her bleeding.  6. Lupus anticoagulant disorder (HCC) -Currently on chronic anticoagulant use, medications held for 5 days. To resume 24 hrs after surgery. Will receive prophylactic dosing in OR.   7. Factor V Leiden (HCC) -Currently on chronic anticoagulant use. See above #6  8. History of DVT (deep vein thrombosis) -Currently on chronic anticoagulant use. See above #6   9. Class 3 severe obesity with body mass index (BMI) of 45.0 to 49.9 in adult, unspecified obesity type, unspecified whether  serious comorbidity present [facility])  -Patient with history of obesity, due to body habitus is an indicator for use of robotic surgical system.    Hildred Laser, MD [facility]

## 2023-04-29 NOTE — Discharge Instructions (Signed)

## 2023-04-30 ENCOUNTER — Encounter: Payer: Self-pay | Admitting: Obstetrics and Gynecology

## 2023-05-01 ENCOUNTER — Ambulatory Visit: Payer: Medicaid Other | Admitting: Occupational Therapy

## 2023-05-03 LAB — SURGICAL PATHOLOGY

## 2023-05-06 ENCOUNTER — Encounter: Payer: Medicaid Other | Admitting: Occupational Therapy

## 2023-05-06 NOTE — Progress Notes (Unsigned)
    OBSTETRICS/GYNECOLOGY POST-OPERATIVE CLINIC VISIT  Subjective:     Caroline Madden is a 42 y.o. female who presents to the clinic 1 weeks status post XI ROBOTIC-ASSISTED TOTAL LAPAROSCOPIC  HYSTERECTOMY WITH BILATERAL SALPINGECTOMY for Abnormal uterine bleeding, history of endometrial ablation, fibroid uterus . Eating a regular diet {with-without:5700} difficulty. Bowel movements are {normal/abnormal***:19619}. {pain control:13522::"The patient is not having any pain."}  The following portions of the patient's history were reviewed and updated as appropriate: allergies, current medications, past family history, past medical history, past social history, past surgical history, and problem list.  Review of Systems Pertinent items are noted in HPI.   Objective:   LMP 03/20/2023 (Approximate)  There is no height or weight on file to calculate BMI.  General:  alert and no distress  Abdomen: soft, bowel sounds active, non-tender  Incision:   {incision:13716::"no dehiscence","incision well approximated","healing well","no drainage","no erythema","no hernia","no seroma","no swelling"}    Pathology:    Assessment:   Patient s/p XI ROBOTIC-ASSISTED TOTAL LAPAROSCOPIC  HYSTERECTOMY WITH BILATERAL SALPINGECTOMY (surgery)  {doing well:13525::"Doing well postoperatively."}   Plan:   1. Continue any current medications as instructed by provider. 2. Wound care discussed. 3. Operative findings again reviewed. Pathology report discussed. 4. Activity restrictions: {restrictions:13723} 5. Anticipated return to work: {work return:14002}. 6. Follow up: 5 weeks for 6 week post op.    Hildred Laser, MD [facility]

## 2023-05-07 ENCOUNTER — Encounter: Payer: Self-pay | Admitting: Obstetrics and Gynecology

## 2023-05-07 ENCOUNTER — Ambulatory Visit: Payer: Medicaid Other | Admitting: Obstetrics and Gynecology

## 2023-05-07 VITALS — BP 122/61 | HR 94 | Resp 16 | Ht <= 58 in | Wt 183.0 lb

## 2023-05-07 DIAGNOSIS — Z90710 Acquired absence of both cervix and uterus: Secondary | ICD-10-CM

## 2023-05-07 DIAGNOSIS — D6851 Activated protein C resistance: Secondary | ICD-10-CM

## 2023-05-07 DIAGNOSIS — Z4889 Encounter for other specified surgical aftercare: Secondary | ICD-10-CM | POA: Diagnosis not present

## 2023-05-07 DIAGNOSIS — N921 Excessive and frequent menstruation with irregular cycle: Secondary | ICD-10-CM

## 2023-05-07 DIAGNOSIS — Z86718 Personal history of other venous thrombosis and embolism: Secondary | ICD-10-CM

## 2023-05-07 DIAGNOSIS — T7849XA Other allergy, initial encounter: Secondary | ICD-10-CM

## 2023-05-07 DIAGNOSIS — D6862 Lupus anticoagulant syndrome: Secondary | ICD-10-CM

## 2023-05-08 ENCOUNTER — Encounter: Payer: Medicaid Other | Admitting: Occupational Therapy

## 2023-06-10 NOTE — Progress Notes (Unsigned)
OBSTETRICS/GYNECOLOGY POST-OPERATIVE CLINIC VISIT  Subjective:     Caroline Madden is a 42 y.o. female who presents to the clinic 2 weeks status post XI ROBOTIC-ASSISTED TOTAL LAPAROSCOPIC HYSTERECTOMY WITH BILATERAL SALPINGECTOMY for Abnormal uterine bleeding, history of endometrial ablation, fibroid uterus . Eating a regular diet {with-without:5700} difficulty. Bowel movements are {normal/abnormal***:19619}. {pain control:13522::"The patient is not having any pain."}  {Common ambulatory SmartLinks:19316}  Review of Systems {ros; complete:30496}   Objective:   There were no vitals taken for this visit. There is no height or weight on file to calculate BMI.  General:  alert and no distress  Abdomen: soft, bowel sounds active, non-tender  Incision:   {incision:13716::"no dehiscence","incision well approximated","healing well","no drainage","no erythema","no hernia","no seroma","no swelling"}    Pathology:   MRN: [id number] Physician: Hildred Laser DOB/Age Sep 01, 1980 (Age: 61) Gender: F Collected Date: 04/29/2023 Received Date: 04/29/2023  FINAL DIAGNOSIS       1. Uterus, cervix and bilateral fallopian tubes,  :      - CERVIX:      - BENIGN SQUAMOUS MUCOSA; NEGATIVE FOR DYSPLASIA      - ENDOCERVICAL MUCOSA WITH IMMATURE SQUAMOUS METAPLASIA AND NABOTHIAN CYSTS      - ENDOMETRIUM STATUS POST ABLATION:      - WEAKLY PROLIFERATIVE ENDOMETRIUM WITH FOCAL TUBAL METAPLASIA; NEGATIVE FOR      HYPERPLASIA      - MYOMETRIUM:      - LEIOMYOMATA AND ENDOSALPINGOSIS      - BILATERAL FALLOPIAN TUBES STATUS POST PARTIAL LEFT SALPINGECTOMY:      - RIGHT FIMBRIATED TUBE WITH COMPLETE CROSS-SECTION EXAMINED      - LEFT TUBE SEGMENT WITH COMPLETE CROSS-SECTION EXAMINED; NO DEFINITIVE FIMBRIA      IDENTIFIED      - NEGATIVE FOR MALIGNANCY  DATE SIGNED OUT: 05/03/2023 ELECTRONIC SIGNATURE : Corey Harold M.D., Dossie Arbour., Pathologist, Electronic Signature  MICROSCOPIC DESCRIPTION  CASE  COMMENTS STAINS USED IN DIAGNOSIS: H&E H&E H&E H&E H&E H&E H&E H&E  CLINICAL HISTORY  SPECIMEN(S) OBTAINED 1. Uterus, cervix and bilateral fallopian tubes,  SPECIMEN COMMENTS: SPECIMEN CLINICAL INFORMATION: 1. Abnormal uterine bleeding.  History of endometrial ablation, fibroid uterus  Gross Description 1. Specimen: Total hysterectomy with bilateral salpingectomy, received fresh in placed in formalin on 04/29/23 at 11:35 a.m.      Specimen integrity (intact/incised/disrupted): Intact.      Size and shape: 7.5 (S-I) x 4.3 (Laterally) x 3.9 (A-P) cm; normal shape      Weight: 66.9 g      Serosa: There are numerous subserosal cysts, 0.2-2.5 cm in greatest dimension,      on the anterior and posterior body and fundus, some of which are coalescing. The      cysts have smooth linings and contain clear fluid.      Cervix: The ectocervix is 3.0 x 3.0 cm, smooth, and glistening. The endocervical      canal is tan with herringbone architecture. No masses or lesions are grossly      identified.      Endometrium: The endometrial canal is 3.5 cm long, 1.5 cm wide from cornu to      cornu. The endometrium is up to 0.2 cm thick, tan-red and soft. No masses or      lesions are grossly identified.      Myometrium: There are multiple ill-defined leiomyomata with tan-white, whorled      cut surfaces. The remaining myometrium is tan-pink and moderately trabeculated  with multiple dilated spaces containing hemorrhagic material.      Right fallopian tube: 6.5 cm long, 0.5 to 0.7 cm in diameter. The serosa is      tan-purple, smooth and glistening. Sectioning reveals a slightly congested cut      surface.      Left fallopian tube: 3.3 cm long, 0.5 cm in diameter. The serosa is tan-purple,      smooth and glistening. No fimbria are present. Sectioning reveals an      unremarkable cut surfac with a 0.1-0.3 cm lumen.      Block Summary:      1A: Cervix      1B-1C: Anterior endomyometrium with  cysts and dilated myometrium      1D-1E: Posterior endomyometrium with cysts and dilated myometrium      22F: Representative leiomyomata      1G: Right fallopian tube bisected fimbria and representative cross sections      1H: Left fallopian tube representative cross sections   Assessment:   Patient s/p XI ROBOTIC-ASSISTED TOTAL LAPAROSCOPIC HYSTERECTOMY WITH BILATERAL SALPINGECTOMY (surgery)  {doing well:13525::"Doing well postoperatively."}   Plan:   1. Continue any current medications as instructed by provider. 2. Wound care discussed. 3. Operative findings again reviewed. Pathology report discussed. 4. Activity restrictions: {restrictions:13723} 5. Anticipated return to work: {work return:14002}. 6. Follow up: {4-16:60630} {time; units:18646} for ***     Hildred Laser, MD [facility]

## 2023-06-11 ENCOUNTER — Encounter: Payer: Self-pay | Admitting: Obstetrics and Gynecology

## 2023-06-11 ENCOUNTER — Ambulatory Visit: Payer: Medicaid Other | Admitting: Obstetrics and Gynecology

## 2023-06-11 VITALS — BP 109/49 | HR 102 | Resp 16 | Ht <= 58 in | Wt 178.2 lb

## 2023-06-11 DIAGNOSIS — Z86718 Personal history of other venous thrombosis and embolism: Secondary | ICD-10-CM

## 2023-06-11 DIAGNOSIS — Z90710 Acquired absence of both cervix and uterus: Secondary | ICD-10-CM

## 2023-06-11 DIAGNOSIS — N921 Excessive and frequent menstruation with irregular cycle: Secondary | ICD-10-CM

## 2023-06-11 DIAGNOSIS — Z4889 Encounter for other specified surgical aftercare: Secondary | ICD-10-CM

## 2023-07-19 ENCOUNTER — Telehealth: Payer: Self-pay

## 2023-07-19 NOTE — Telephone Encounter (Signed)
 Caroline from [employer] calling to get the medicaid hysterectomy consent form .Pt had surgery on 04/29/23  Her fax is [phone] Email [email or web address] Phone [phone] Im not seeing the form under media. Please advise.

## 2023-10-16 ENCOUNTER — Other Ambulatory Visit: Payer: Self-pay | Admitting: Internal Medicine

## 2023-10-16 DIAGNOSIS — Z1231 Encounter for screening mammogram for malignant neoplasm of breast: Secondary | ICD-10-CM

## 2023-10-18 ENCOUNTER — Encounter: Payer: Self-pay | Admitting: Internal Medicine

## 2023-11-06 ENCOUNTER — Inpatient Hospital Stay: Attending: Internal Medicine | Admitting: Internal Medicine

## 2023-11-06 ENCOUNTER — Encounter: Payer: Self-pay | Admitting: Internal Medicine

## 2023-11-06 ENCOUNTER — Inpatient Hospital Stay

## 2023-11-06 VITALS — BP 126/85 | HR 77 | Temp 97.9°F | Resp 16 | Ht <= 58 in | Wt 182.2 lb

## 2023-11-06 DIAGNOSIS — Z800 Family history of malignant neoplasm of digestive organs: Secondary | ICD-10-CM | POA: Diagnosis not present

## 2023-11-06 DIAGNOSIS — Z85828 Personal history of other malignant neoplasm of skin: Secondary | ICD-10-CM | POA: Insufficient documentation

## 2023-11-06 DIAGNOSIS — Z87442 Personal history of urinary calculi: Secondary | ICD-10-CM | POA: Diagnosis not present

## 2023-11-06 DIAGNOSIS — Z86711 Personal history of pulmonary embolism: Secondary | ICD-10-CM | POA: Diagnosis not present

## 2023-11-06 DIAGNOSIS — T82868A Thrombosis of vascular prosthetic devices, implants and grafts, initial encounter: Secondary | ICD-10-CM | POA: Insufficient documentation

## 2023-11-06 DIAGNOSIS — I82521 Chronic embolism and thrombosis of right iliac vein: Secondary | ICD-10-CM | POA: Diagnosis present

## 2023-11-06 DIAGNOSIS — Z823 Family history of stroke: Secondary | ICD-10-CM | POA: Diagnosis not present

## 2023-11-06 DIAGNOSIS — Z8349 Family history of other endocrine, nutritional and metabolic diseases: Secondary | ICD-10-CM | POA: Insufficient documentation

## 2023-11-06 DIAGNOSIS — M7989 Other specified soft tissue disorders: Secondary | ICD-10-CM | POA: Insufficient documentation

## 2023-11-06 DIAGNOSIS — Z8249 Family history of ischemic heart disease and other diseases of the circulatory system: Secondary | ICD-10-CM | POA: Insufficient documentation

## 2023-11-06 DIAGNOSIS — Z90710 Acquired absence of both cervix and uterus: Secondary | ICD-10-CM | POA: Insufficient documentation

## 2023-11-06 DIAGNOSIS — D6851 Activated protein C resistance: Secondary | ICD-10-CM | POA: Diagnosis not present

## 2023-11-06 DIAGNOSIS — M2550 Pain in unspecified joint: Secondary | ICD-10-CM | POA: Diagnosis not present

## 2023-11-06 DIAGNOSIS — D751 Secondary polycythemia: Secondary | ICD-10-CM

## 2023-11-06 DIAGNOSIS — R5383 Other fatigue: Secondary | ICD-10-CM | POA: Diagnosis not present

## 2023-11-06 DIAGNOSIS — Z79899 Other long term (current) drug therapy: Secondary | ICD-10-CM | POA: Diagnosis not present

## 2023-11-06 DIAGNOSIS — Z9079 Acquired absence of other genital organ(s): Secondary | ICD-10-CM | POA: Diagnosis not present

## 2023-11-06 DIAGNOSIS — Z7901 Long term (current) use of anticoagulants: Secondary | ICD-10-CM | POA: Diagnosis not present

## 2023-11-06 LAB — CBC WITH DIFFERENTIAL/PLATELET
Abs Immature Granulocytes: 0.03 10*3/uL (ref 0.00–0.07)
Basophils Absolute: 0 10*3/uL (ref 0.0–0.1)
Basophils Relative: 1 %
Eosinophils Absolute: 0.1 10*3/uL (ref 0.0–0.5)
Eosinophils Relative: 1 %
HCT: 47.5 % — ABNORMAL HIGH (ref 36.0–46.0)
Hemoglobin: 15.9 g/dL — ABNORMAL HIGH (ref 12.0–15.0)
Immature Granulocytes: 0 %
Lymphocytes Relative: 28 %
Lymphs Abs: 2.4 10*3/uL (ref 0.7–4.0)
MCH: 30.6 pg (ref 26.0–34.0)
MCHC: 33.5 g/dL (ref 30.0–36.0)
MCV: 91.3 fL (ref 80.0–100.0)
Monocytes Absolute: 0.5 10*3/uL (ref 0.1–1.0)
Monocytes Relative: 6 %
Neutro Abs: 5.4 10*3/uL (ref 1.7–7.7)
Neutrophils Relative %: 64 %
Platelets: 249 10*3/uL (ref 150–400)
RBC: 5.2 MIL/uL — ABNORMAL HIGH (ref 3.87–5.11)
RDW: 13.2 % (ref 11.5–15.5)
WBC: 8.5 10*3/uL (ref 4.0–10.5)
nRBC: 0 % (ref 0.0–0.2)

## 2023-11-06 LAB — COMPREHENSIVE METABOLIC PANEL WITH GFR
ALT: 14 U/L (ref 0–44)
AST: 17 U/L (ref 15–41)
Albumin: 4.1 g/dL (ref 3.5–5.0)
Alkaline Phosphatase: 51 U/L (ref 38–126)
Anion gap: 10 (ref 5–15)
BUN: 14 mg/dL (ref 6–20)
CO2: 25 mmol/L (ref 22–32)
Calcium: 9.2 mg/dL (ref 8.9–10.3)
Chloride: 101 mmol/L (ref 98–111)
Creatinine, Ser: 0.65 mg/dL (ref 0.44–1.00)
GFR, Estimated: >60 mL/min (ref >60)
Glucose, Bld: 85 mg/dL (ref 70–99)
Potassium: 4 mmol/L (ref 3.5–5.1)
Sodium: 136 mmol/L (ref 135–145)
Total Bilirubin: 0.7 mg/dL (ref 0.0–1.2)
Total Protein: 7.4 g/dL (ref 6.5–8.1)

## 2023-11-06 LAB — LACTATE DEHYDROGENASE: LDH: 111 U/L (ref 98–192)

## 2023-11-06 NOTE — Progress Notes (Signed)
 Sent by Dr. Kevan Peers for "blood cancer". Had labs done last month at [facility].  Pt states she falls a lot. Her right leg/side give out.  Fingers swell due to lymphedema.

## 2023-11-06 NOTE — Assessment & Plan Note (Addendum)
 Caroline Madden

## 2023-11-06 NOTE — Assessment & Plan Note (Addendum)
#   Erythrocytosis- likley secondary.  [currently no menstruation].  Discussed the potential causes including but not limited to secondary erythrocytosis which includes-obesity hypoventilation syndrome OSA smoking medication etc.  Also discussed regarding primary bone marrow possibly polycythemia vera.   # Recommend checking JAK2 mutation LDH labs CBC CMP-check erythropoietin  level.  Consider phlebotomy if hematocrit greater than 47.  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 Continue Eliquis for now. Patient will need indefinite anticoagulation- stable.   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 continue stockings [Dr.Schneir]- s/p Lymphedema Treatement-    # DISPOSITION: # labs today- ordered.  # follow up in appx 2 weeks- MD: no labs- possible Phelbotomy- dr.B

## 2023-11-06 NOTE — Progress Notes (Signed)
 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 PE- 2005/pregnancy]; factor V leiden heterozygous; Lupus anticoagulant positivity; Obesity [last Seen Dr. Joelyn Music; May 2014] s/p Stenting Right iliac/ s/p IVC filter- on xarelto; JUne 2017- Fem/pop V DV- chronic/acute ; July 2017- Started on Eliquis [Dr.Schneir]; ? switched to xarelto; ?  Switched on Eliquis- LE DOPPLERS- NOV 2022- Sonographic survey negative for evidence of acute DVT of the right lower extremity. Changes/sequela of prior right lower extremity femoropopliteal DVT; Partially thrombosed venous varicosities/collaterals in the right  inguinal region, compatible with superficial thrombophlebitis.  # Right iliac stent thrombosis [at [facility] 2014]/ chronic Right LE thormbophlebitis    HISTORY OF PRESENTING ILLNESS: Patient ambulating-independently. Alone .   Caroline Madden 43 y.o.  female with a history of multiple DVT of the right lower extremity [risk factors listed above]- currently on Eliquis; and right leg postphlebitic syndrome is referred to us  for further evaluation recommendations for elevated hematocrit.  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  Otherwise no new new blood clots.  Patient is compliant with her Eliquis.    Review of Systems  Constitutional:  Positive for malaise/fatigue. Negative for chills, diaphoresis, fever and weight loss.  HENT:  Negative for nosebleeds and sore throat.   Eyes:  Negative for double vision.  Respiratory:  Negative for cough, hemoptysis, sputum production, shortness of breath and wheezing.   Cardiovascular:  Positive for leg swelling. Negative for chest pain, palpitations and orthopnea.  Gastrointestinal:  Negative  for abdominal pain, blood in stool, constipation, diarrhea, heartburn, melena, nausea and vomiting.  Genitourinary:  Negative for dysuria, frequency and urgency.  Musculoskeletal:  Positive for joint pain. Negative for back pain.  Skin: Negative.  Negative for itching and rash.  Neurological:  Positive for dizziness. Negative for tingling, focal weakness, weakness and headaches.  Endo/Heme/Allergies:  Does not bruise/bleed easily.  Psychiatric/Behavioral:  Negative for depression. The patient is not nervous/anxious and does not have insomnia.      MEDICAL HISTORY:  Past Medical History:  Diagnosis Date   Anemia    Anxiety    DVT (deep venous thrombosis) (HCC) 2002   Factor V Leiden (HCC)    a.) on chronic DOAC therapy (apixaban)   History of kidney stones    Lymphedema    right leg-uses lymphedema pump   MDD (major depressive disorder)    Migraines    On apixaban therapy    PE (pulmonary thromboembolism) (HCC)    Polycythemia    Skin cancer     SURGICAL HISTORY: Past Surgical History:  Procedure Laterality Date   CYST EXCISION  08/20/2019   Scalp   GANGLION CYST EXCISION     IR FALLOPIAN TUBE CATHETERIZATION N/A    removal per pt. of 1 fallopian tube   IVC FILTER INSERTION     LAPAROSCOPIC OVARIAN CYSTECTOMY Left 09/27/2020   Procedure: LAPAROSCOPIC OVARIAN CYSTECTOMY;  Surgeon: Alben Alma, MD;  Location: [facility] OR[facility];  Service: Gynecology;  Laterality: Left;   LAPAROSCOPIC UNILATERAL SALPINGECTOMY Left 09/27/2020   Procedure: LAPAROSCOPIC UNILATERAL SALPINGECTOMY;  Surgeon: Alben Alma, MD;  Location: [facility];  Service: Gynecology;  Laterality: Left;   PERIPHERAL VASCULAR THROMBECTOMY Right 11/07/2021   Procedure: PERIPHERAL VASCULAR THROMBECTOMY;  Surgeon: Jackquelyn Mass, MD;  Location: [facility];  Service: Cardiovascular;  Laterality: Right;   ROBOTIC ASSISTED TOTAL HYSTERECTOMY WITH BILATERAL SALPINGO OOPHERECTOMY N/A 04/29/2023   Procedure: XI  ROBOTIC-ASSISTED TOTAL LAPAROSCOPIC HYSTERECTOMY WITH BILATERAL SALPINGECTOMY;  Surgeon: Teresa Fender, MD;  Location: [facility];  Service: Gynecology;  Laterality: N/A;   WISDOM TOOTH EXTRACTION      SOCIAL HISTORY: Social History   Socioeconomic History   Marital status: Single    Spouse name: Not on file   Number of children: 1   Years of education: Not on file   Highest education level: Not on file  Occupational History   Not on file  Tobacco Use   Smoking status: Never   Smokeless tobacco: Never  Vaping Use   Vaping status: Never Used  Substance and Sexual Activity   Alcohol use: No   Drug use: No   Sexual activity: Yes    Birth control/protection: None  Other Topics Concern   Not on file  Social History Narrative   Has a 78 y.o. daughter    Social Drivers of Corporate investment banker Strain: Low Risk  (04/16/2023)   Received from [facility]   Overall Financial Resource Strain (CARDIA)    Difficulty of Paying Living Expenses: Not hard at all  Food Insecurity: No Food Insecurity (04/16/2023)   Received from [facility]   Hunger Vital Sign    Worried About Running Out of Food in the Last Year: Never true    Ran Out of Food in the Last Year: Never true  Transportation Needs: No Transportation Needs (04/16/2023)   Received from [facility] - Transportation    In the past 12 months, has lack of transportation kept you from medical appointments or from getting medications?: No    Lack of Transportation (Non-Medical): No  Physical Activity: Not on file  Stress: Not on file  Social Connections: Not on file  Intimate Partner Violence: Not on file    FAMILY HISTORY: Family History  Problem Relation Age of Onset   Lupus Mother    Stroke Father    Parkinson's disease Father    Hypertension Father    Gout Father    Colon cancer Maternal Grandmother    Stomach cancer Maternal Grandmother      ALLERGIES:  is allergic to wheat, latex, other, penicillin v potassium, and strawberry extract.  MEDICATIONS:  Current Outpatient Medications  Medication Sig Dispense Refill   ELIQUIS 5 MG TABS tablet TAKE 1 TABLET BY MOUTH TWICE PER DAY 60 tablet 6   No current facility-administered medications for this visit.     PHYSICAL EXAMINATION:   Vitals:   11/06/23 1340  BP: 126/85  Pulse: 77  Resp: 16  Temp: 97.9 F (36.6 C)  SpO2: 99%    Filed Weights   11/06/23 1340  Weight: 182 lb 3.2 oz (82.6 kg)   Maculopapular rash on the right lower extremity.  Physical Exam Vitals and nursing note reviewed.  HENT:     Head: Normocephalic and atraumatic.     Mouth/Throat:     Pharynx: Oropharynx is clear.  Eyes:     Extraocular Movements: Extraocular movements intact.     Pupils: Pupils are equal, round, and reactive to light.  Cardiovascular:     Rate and Rhythm: Normal rate and regular rhythm.  Pulmonary:     Comments: Decreased breath sounds bilaterally.  Abdominal:     Palpations: Abdomen is soft.  Musculoskeletal:        General: Normal range of motion.     Cervical back: Normal range of motion.  Skin:    General: Skin is warm.  Neurological:     General: No focal deficit present.     Mental Status: She is alert and oriented to person, place, and time.  Psychiatric:        Behavior: Behavior normal.        Judgment: Judgment normal.      LABORATORY DATA:  I have reviewed the data as listed Lab Results  Component Value Date   WBC 8.5 11/06/2023   HGB 15.9 (H) 11/06/2023   HCT 47.5 (H) 11/06/2023   MCV 91.3 11/06/2023   PLT 249 11/06/2023   Recent Labs    04/26/23 1047  NA 137  K 3.9  CL 101  CO2 25  GLUCOSE 89  BUN 23*  CREATININE 0.58  CALCIUM 9.5  GFRNONAA >60  PROT 7.7  ALBUMIN 4.3  AST 17  ALT 16  ALKPHOS 50  BILITOT 0.6    RADIOGRAPHIC STUDIES: I have personally reviewed the radiological images as listed and agreed with the  findings in the report. No results found.  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  [currently no menstruation].  Discussed the potential causes including but not limited to secondary erythrocytosis which includes-obesity hypoventilation syndrome OSA smoking medication etc.  Also discussed regarding primary bone marrow possibly polycythemia vera.   # Recommend checking JAK2 mutation LDH labs CBC CMP-check erythropoietin  level.  Consider phlebotomy if hematocrit greater than 47.  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 Continue Eliquis for now. Patient will need indefinite anticoagulation- stable.   # post phlebitic syndrome/ or possibly in stent thrombosis as noted on imaging in May of 2014 at [facility]. continue stockings [Dr.Schneir]- s/p Lymphedema Treatement-    # DISPOSITION: # labs today- ordered.  # follow up in appx 2 weeks- MD: no labs- possible Phelbotomy- dr.B     Gwyn Leos, MD 11/06/2023 3:05 PM

## 2023-11-07 LAB — ERYTHROPOIETIN: Erythropoietin: 10.9 m[IU]/mL (ref 2.6–18.5)

## 2023-11-10 LAB — JAK2 GENOTYPR

## 2023-11-11 LAB — NGS JAK2 EXONS 12-15

## 2023-11-18 ENCOUNTER — Inpatient Hospital Stay

## 2023-11-18 ENCOUNTER — Inpatient Hospital Stay: Attending: Internal Medicine | Admitting: Internal Medicine

## 2023-11-18 VITALS — HR 100 | Temp 97.6°F | Resp 14 | Wt 182.0 lb

## 2023-11-18 DIAGNOSIS — Z800 Family history of malignant neoplasm of digestive organs: Secondary | ICD-10-CM | POA: Insufficient documentation

## 2023-11-18 DIAGNOSIS — Z8249 Family history of ischemic heart disease and other diseases of the circulatory system: Secondary | ICD-10-CM | POA: Diagnosis not present

## 2023-11-18 DIAGNOSIS — D6851 Activated protein C resistance: Secondary | ICD-10-CM | POA: Insufficient documentation

## 2023-11-18 DIAGNOSIS — D751 Secondary polycythemia: Secondary | ICD-10-CM | POA: Insufficient documentation

## 2023-11-18 DIAGNOSIS — R42 Dizziness and giddiness: Secondary | ICD-10-CM | POA: Diagnosis not present

## 2023-11-18 DIAGNOSIS — R5383 Other fatigue: Secondary | ICD-10-CM | POA: Diagnosis not present

## 2023-11-18 DIAGNOSIS — Z7901 Long term (current) use of anticoagulants: Secondary | ICD-10-CM | POA: Insufficient documentation

## 2023-11-18 DIAGNOSIS — Z823 Family history of stroke: Secondary | ICD-10-CM | POA: Insufficient documentation

## 2023-11-18 DIAGNOSIS — Z86711 Personal history of pulmonary embolism: Secondary | ICD-10-CM | POA: Insufficient documentation

## 2023-11-18 DIAGNOSIS — I82521 Chronic embolism and thrombosis of right iliac vein: Secondary | ICD-10-CM | POA: Diagnosis present

## 2023-11-18 DIAGNOSIS — M7989 Other specified soft tissue disorders: Secondary | ICD-10-CM | POA: Diagnosis not present

## 2023-11-18 DIAGNOSIS — Z85828 Personal history of other malignant neoplasm of skin: Secondary | ICD-10-CM | POA: Diagnosis not present

## 2023-11-18 DIAGNOSIS — Z5971 Insufficient health insurance coverage: Secondary | ICD-10-CM | POA: Diagnosis not present

## 2023-11-18 DIAGNOSIS — T82868A Thrombosis of vascular prosthetic devices, implants and grafts, initial encounter: Secondary | ICD-10-CM | POA: Diagnosis not present

## 2023-11-18 DIAGNOSIS — M2550 Pain in unspecified joint: Secondary | ICD-10-CM | POA: Diagnosis not present

## 2023-11-18 DIAGNOSIS — Z8349 Family history of other endocrine, nutritional and metabolic diseases: Secondary | ICD-10-CM | POA: Diagnosis not present

## 2023-11-18 DIAGNOSIS — Z90710 Acquired absence of both cervix and uterus: Secondary | ICD-10-CM | POA: Insufficient documentation

## 2023-11-18 DIAGNOSIS — Z818 Family history of other mental and behavioral disorders: Secondary | ICD-10-CM | POA: Insufficient documentation

## 2023-11-18 DIAGNOSIS — I890 Lymphedema, not elsewhere classified: Secondary | ICD-10-CM | POA: Diagnosis not present

## 2023-11-18 DIAGNOSIS — Z9079 Acquired absence of other genital organ(s): Secondary | ICD-10-CM | POA: Diagnosis not present

## 2023-11-18 DIAGNOSIS — Z87442 Personal history of urinary calculi: Secondary | ICD-10-CM | POA: Insufficient documentation

## 2023-11-18 DIAGNOSIS — Z79899 Other long term (current) drug therapy: Secondary | ICD-10-CM | POA: Insufficient documentation

## 2023-11-18 NOTE — Progress Notes (Signed)
 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 PE- 2005/pregnancy]; factor V leiden heterozygous; Lupus anticoagulant positivity; Obesity [last Seen Dr. Joelyn Music; May 2014] s/p Stenting Right iliac/ s/p IVC filter- on xarelto; JUne 2017- Fem/pop V DV- chronic/acute ; July 2017- Started on Eliquis [Dr.Schneir]; ? switched to xarelto; ?  Switched on Eliquis- LE DOPPLERS- NOV 2022- Sonographic survey negative for evidence of acute DVT of the right lower extremity. Changes/sequela of prior right lower extremity femoropopliteal DVT; Partially thrombosed venous varicosities/collaterals in the right  inguinal region, compatible with superficial thrombophlebitis.  # Right iliac stent thrombosis [at [facility] 2014]/ chronic Right LE thormbophlebitis    HISTORY OF PRESENTING ILLNESS: Patient ambulating-independently. Alone .   Caroline Madden 43 y.o.  female with a history of multiple DVT of the right lower extremity [risk factors listed above]- currently on Eliquis; and right leg postphlebitic syndrome is referred to us  for further evaluation recommendations for elevated hematocrit.  Pt in for follow up, lab results and treatment plan.     Review of Systems  Constitutional:  Positive for malaise/fatigue. Negative for chills, diaphoresis, fever and weight loss.  HENT:  Negative for nosebleeds and sore throat.   Eyes:  Negative for double vision.  Respiratory:  Negative for cough, hemoptysis, sputum production, shortness of breath and wheezing.   Cardiovascular:  Positive for leg swelling. Negative for chest pain, palpitations and orthopnea.  Gastrointestinal:  Negative for abdominal pain, blood in stool, constipation, diarrhea, heartburn, melena, nausea and  vomiting.  Genitourinary:  Negative for dysuria, frequency and urgency.  Musculoskeletal:  Positive for joint pain. Negative for back pain.  Skin: Negative.  Negative for itching and rash.  Neurological:  Positive for dizziness. Negative for tingling, focal weakness, weakness and headaches.  Endo/Heme/Allergies:  Does not bruise/bleed easily.  Psychiatric/Behavioral:  Negative for depression. The patient is not nervous/anxious and does not have insomnia.      MEDICAL HISTORY:  Past Medical History:  Diagnosis Date   Anemia    Anxiety    DVT (deep venous thrombosis) (HCC) 2002   Factor V Leiden (HCC)    a.) on chronic DOAC therapy (apixaban)   History of kidney stones    Lymphedema    right leg-uses lymphedema pump   MDD (major depressive disorder)    Migraines    On apixaban therapy    PE (pulmonary thromboembolism) (HCC)    Polycythemia    Skin cancer     SURGICAL HISTORY: Past Surgical History:  Procedure Laterality Date   CYST EXCISION  08/20/2019   Scalp   GANGLION CYST EXCISION     IR FALLOPIAN TUBE CATHETERIZATION N/A    removal per pt. of 1 fallopian tube   IVC FILTER INSERTION     LAPAROSCOPIC OVARIAN CYSTECTOMY Left 09/27/2020   Procedure: LAPAROSCOPIC OVARIAN CYSTECTOMY;  Surgeon: Alben Alma, MD;  Location: [facility];  Service: Gynecology;  Laterality: Left;   LAPAROSCOPIC UNILATERAL SALPINGECTOMY Left 09/27/2020   Procedure: LAPAROSCOPIC UNILATERAL SALPINGECTOMY;  Surgeon: Alben Alma, MD;  Location: [facility];  Service: Gynecology;  Laterality: Left;   PERIPHERAL VASCULAR THROMBECTOMY Right 11/07/2021   Procedure: PERIPHERAL VASCULAR THROMBECTOMY;  Surgeon: Jackquelyn Mass, MD;  Location: [facility];  Service: Cardiovascular;  Laterality: Right;  ROBOTIC ASSISTED TOTAL HYSTERECTOMY WITH BILATERAL SALPINGO OOPHERECTOMY N/A 04/29/2023   Procedure: XI ROBOTIC-ASSISTED TOTAL LAPAROSCOPIC HYSTERECTOMY WITH BILATERAL SALPINGECTOMY;  Surgeon:  Teresa Fender, MD;  Location: [facility];  Service: Gynecology;  Laterality: N/A;   WISDOM TOOTH EXTRACTION      SOCIAL HISTORY: Social History   Socioeconomic History   Marital status: Single    Spouse name: Not on file   Number of children: 1   Years of education: Not on file   Highest education level: Not on file  Occupational History   Not on file  Tobacco Use   Smoking status: Never   Smokeless tobacco: Never  Vaping Use   Vaping status: Never Used  Substance and Sexual Activity   Alcohol use: No   Drug use: No   Sexual activity: Yes    Birth control/protection: None  Other Topics Concern   Not on file  Social History Narrative   Has a 39 y.o. daughter    Social Drivers of Corporate investment banker Strain: Low Risk  (04/16/2023)   Received from [facility]   Overall Financial Resource Strain (CARDIA)    Difficulty of Paying Living Expenses: Not hard at all  Food Insecurity: No Food Insecurity (04/16/2023)   Received from [facility]   Hunger Vital Sign    Worried About Running Out of Food in the Last Year: Never true    Ran Out of Food in the Last Year: Never true  Transportation Needs: No Transportation Needs (04/16/2023)   Received from [facility] - Transportation    In the past 12 months, has lack of transportation kept you from medical appointments or from getting medications?: No    Lack of Transportation (Non-Medical): No  Physical Activity: Not on file  Stress: Not on file  Social Connections: Not on file  Intimate Partner Violence: Not on file    FAMILY HISTORY: Family History  Problem Relation Age of Onset   Lupus Mother    Stroke Father    Parkinson's disease Father    Hypertension Father    Gout Father    Colon cancer Maternal Grandmother    Stomach cancer Maternal Grandmother     ALLERGIES:  is allergic to wheat, latex, other, penicillin v potassium, and strawberry  extract.  MEDICATIONS:  Current Outpatient Medications  Medication Sig Dispense Refill   ELIQUIS 5 MG TABS tablet TAKE 1 TABLET BY MOUTH TWICE PER DAY 60 tablet 6   No current facility-administered medications for this visit.     PHYSICAL EXAMINATION:   Vitals:   11/18/23 1541  Pulse: 100  Resp: 14  Temp: 97.6 F (36.4 C)  SpO2: 97%    Filed Weights   11/18/23 1541  Weight: 182 lb (82.6 kg)   Maculopapular rash on the right lower extremity.  Physical Exam Vitals and nursing note reviewed.  HENT:     Head: Normocephalic and atraumatic.     Mouth/Throat:     Pharynx: Oropharynx is clear.  Eyes:     Extraocular Movements: Extraocular movements intact.     Pupils: Pupils are equal, round, and reactive to light.  Cardiovascular:     Rate and Rhythm: Normal rate and regular rhythm.  Pulmonary:     Comments: Decreased breath sounds bilaterally.  Abdominal:     Palpations: Abdomen is soft.  Musculoskeletal:        General: Normal range of motion.  Cervical back: Normal range of motion.  Skin:    General: Skin is warm.  Neurological:     General: No focal deficit present.     Mental Status: She is alert and oriented to person, place, and time.  Psychiatric:        Behavior: Behavior normal.        Judgment: Judgment normal.      LABORATORY DATA:  I have reviewed the data as listed Lab Results  Component Value Date   WBC 8.5 11/06/2023   HGB 15.9 (H) 11/06/2023   HCT 47.5 (H) 11/06/2023   MCV 91.3 11/06/2023   PLT 249 11/06/2023   Recent Labs    04/26/23 1047 11/06/23 1444  NA 137 136  K 3.9 4.0  CL 101 101  CO2 25 25  GLUCOSE 89 85  BUN 23* 14  CREATININE 0.58 0.65  CALCIUM 9.5 9.2  GFRNONAA >60 >60  PROT 7.7 7.4  ALBUMIN 4.3 4.1  AST 17 17  ALT 16 14  ALKPHOS 50 51  BILITOT 0.6 0.7    RADIOGRAPHIC STUDIES: I have personally reviewed the radiological images as listed and agreed with the findings in the report. No results  found.  ASSESSMENT & PLAN:  Erythrocytosis # Erythrocytosis- likley secondary.  [currently no menstruation].  Discussed the potential causes including but not limited to secondary erythrocytosis which includes-obesity hypoventilation syndrome OSA. However, patient states No insurance coverage sleep study as per patient. APRIL 2025- JAK-2 NEG; EPO-WNL- HOLD off bone marrow.HOLD off phlebotomy today.   # History of repeated/recurrent DVT of the right lower extremity- factor V leiden heterozygous-most recently question acute/chronic femoral/popliteal vein DVT- currently on Eliquis as per vascular/ Dr.Schneir-Hande [Oct 2023- Dr.Moll]. Continue Eliquis for now. Patient will need indefinite anticoagulation- stable.   # post phlebitic syndrome/ or possibly in stent thrombosis as noted on imaging in May of 2014 at [facility]. continue stockings [Dr.Schneir]- s/p Lymphedema Treatement-  stable.  # Obesity: recommend weight loss; ? Unable as per pt given lymphedema- defer to PCP.   Labs- pcp/Dr.Moll # DISPOSITION: #  NO Phelbotomy- # follow up in 6 months- MD: NO labs- -possible  Phelbotomy-Dr.B     Gwyn Leos, MD 11/18/2023 4:25 PM

## 2023-11-18 NOTE — Progress Notes (Signed)
 Per md-no phlebotomy needed.

## 2023-11-18 NOTE — Progress Notes (Signed)
 Pt in for follow up, lab results and treatment plan.

## 2023-11-18 NOTE — Assessment & Plan Note (Addendum)
#   Erythrocytosis- likley secondary.  [currently no menstruation].  Discussed the potential causes including but not limited to secondary erythrocytosis which includes-obesity hypoventilation syndrome OSA. However, patient states No insurance coverage sleep study as per patient. APRIL 2025- JAK-2 NEG; EPO-WNL- HOLD off bone marrow.HOLD off phlebotomy today.   # History of repeated/recurrent DVT of the right lower extremity- factor V leiden heterozygous-most recently question acute/chronic femoral/popliteal vein DVT- currently on Eliquis as per vascular/ Dr.Schneir-Hande [Oct 2023- Dr.Moll]. Continue Eliquis for now. Patient will need indefinite anticoagulation- stable.   # post phlebitic syndrome/ or possibly in stent thrombosis as noted on imaging in May of 2014 at [facility]. continue stockings [Dr.Schneir]- s/p Lymphedema Treatement-  stable.  # Obesity: recommend weight loss; ? Unable as per pt given lymphedema- defer to PCP.   Labs- pcp/Dr.Moll # DISPOSITION: #  NO Phelbotomy- # follow up in 6 months- MD: NO labs- -possible  Phelbotomy-Dr.B

## 2023-11-28 ENCOUNTER — Ambulatory Visit (INDEPENDENT_AMBULATORY_CARE_PROVIDER_SITE_OTHER): Payer: Medicaid Other | Admitting: Nurse Practitioner

## 2023-11-28 VITALS — BP 143/94 | HR 82 | Resp 17 | Ht <= 58 in | Wt 179.6 lb

## 2023-11-28 DIAGNOSIS — D6851 Activated protein C resistance: Secondary | ICD-10-CM | POA: Diagnosis not present

## 2023-11-28 DIAGNOSIS — I890 Lymphedema, not elsewhere classified: Secondary | ICD-10-CM

## 2023-11-28 DIAGNOSIS — I87009 Postthrombotic syndrome without complications of unspecified extremity: Secondary | ICD-10-CM | POA: Diagnosis not present

## 2023-12-02 ENCOUNTER — Encounter (INDEPENDENT_AMBULATORY_CARE_PROVIDER_SITE_OTHER): Payer: Self-pay | Admitting: Nurse Practitioner

## 2023-12-06 NOTE — Progress Notes (Unsigned)
 Subjective:    Patient ID: Caroline Madden, female    DOB: 1981/04/27, 43 y.o.   MRN: [id number] Chief Complaint  Patient presents with   Venous Insufficiency    The patient returns today for her lymphedema.  She recently received a lymphedema pump as well as a sleeve for her right arm.  She notes that they have been helpful controlling her swelling in the lower portions but near the top part of her thigh and in her arm they do not fit properly and so the swelling accumulates at the base of these machines.  Swelling is uncomfortable for her.  She denies any open wounds or ulcerations.  She was referred to the lymphedema clinic but has not yet heard anything from them.    Review of Systems  Cardiovascular:  Positive for leg swelling.  All other systems reviewed and are negative.      Objective:    Physical Exam Vitals reviewed.  HENT:     Head: Normocephalic.  Cardiovascular:     Rate and Rhythm: Normal rate.  Pulmonary:     Effort: Pulmonary effort is normal.  Musculoskeletal:     Right lower leg: Edema present.     Left lower leg: Edema present.  Skin:    General: Skin is warm and dry.  Neurological:     Mental Status: She is alert and oriented to person, place, and time.  Psychiatric:        Mood and Affect: Mood normal.        Behavior: Behavior normal.        Thought Content: Thought content normal.        Judgment: Judgment normal.     BP (!) 143/94 (BP Location: Left Wrist, Patient Position: Sitting, Cuff Size: Small)   Pulse 82   Resp 17   Ht 4\' 3"  (1.295 m)   Wt 179 lb 9.6 oz (81.5 kg)   LMP 03/20/2023 (Approximate)   BMI 48.55 kg/m   Past Medical History:  Diagnosis Date   Anemia    Anxiety    DVT (deep venous thrombosis) (HCC) 2002   Factor V Leiden (HCC)    a.) on chronic DOAC therapy (apixaban)   History of kidney stones    Lymphedema    right leg-uses lymphedema pump   MDD (major depressive disorder)    Migraines    On apixaban therapy     PE (pulmonary thromboembolism) (HCC)    Polycythemia    Skin cancer     Social History   Socioeconomic History   Marital status: Single    Spouse name: Not on file   Number of children: 1   Years of education: Not on file   Highest education level: Not on file  Occupational History   Not on file  Tobacco Use   Smoking status: Never   Smokeless tobacco: Never  Vaping Use   Vaping status: Never Used  Substance and Sexual Activity   Alcohol use: No   Drug use: No   Sexual activity: Yes    Birth control/protection: None  Other Topics Concern   Not on file  Social History Narrative   Has a 14 y.o. daughter    Social Drivers of Corporate investment banker Strain: Low Risk  (04/16/2023)   Received from [facility]   Overall Financial Resource Strain (CARDIA)    Difficulty of Paying Living Expenses: Not hard at all  Food Insecurity: No Food Insecurity (  04/16/2023)   Received from [facility]   Hunger Vital Sign    Worried About Running Out of Food in the Last Year: Never true    Ran Out of Food in the Last Year: Never true  Transportation Needs: No Transportation Needs (04/16/2023)   [facility]    In the past 12 months, has lack of transportation kept you from medical appointments or from getting medications?: No    Lack of Transportation (Non-Medical): No  Physical Activity: Not on file  Stress: Not on file  Social Connections: Not on file  Intimate Partner Violence: Not on file    Past Surgical History:  Procedure Laterality Date   CYST EXCISION  08/20/2019   Scalp   GANGLION CYST EXCISION     IR FALLOPIAN TUBE CATHETERIZATION N/A    removal per pt. of 1 fallopian tube   IVC FILTER INSERTION     LAPAROSCOPIC OVARIAN CYSTECTOMY Left 09/27/2020   Procedure: LAPAROSCOPIC OVARIAN CYSTECTOMY;  Surgeon: Alben Alma, MD;  Location: [facility] ORS;  Service: Gynecology;   Laterality: Left;   LAPAROSCOPIC UNILATERAL SALPINGECTOMY Left 09/27/2020   Procedure: LAPAROSCOPIC UNILATERAL SALPINGECTOMY;  Surgeon: Alben Alma, MD;  Location: [facility] ORS;  Service: Gynecology;  Laterality: Left;   PERIPHERAL VASCULAR THROMBECTOMY Right 11/07/2021   Procedure: PERIPHERAL VASCULAR THROMBECTOMY;  Surgeon: Jackquelyn Mass, MD;  Location: [facility] INVASIVE CV LAB;  Service: Cardiovascular;  Laterality: Right;   ROBOTIC ASSISTED TOTAL HYSTERECTOMY WITH BILATERAL SALPINGO OOPHERECTOMY N/A 04/29/2023   Procedure: XI ROBOTIC-ASSISTED TOTAL LAPAROSCOPIC HYSTERECTOMY WITH BILATERAL SALPINGECTOMY;  Surgeon: Teresa Fender, MD;  Location: [facility] ORS;  Service: Gynecology;  Laterality: N/A;   WISDOM TOOTH EXTRACTION      Family History  Problem Relation Age of Onset   Lupus Mother    Stroke Father    Parkinson's disease Father    Hypertension Father    Gout Father    Colon cancer Maternal Grandmother    Stomach cancer Maternal Grandmother     Allergies  Allergen Reactions   Wheat Diarrhea    bloating   Eggshell Membrane (Chicken) [Egg Shells] Rash   Latex Rash   Other Rash    Adhesives such as bandaids   Penicillin V Potassium Rash   Strawberry Extract Rash       Latest Ref Rng & Units 11/06/2023    2:44 PM 04/26/2023   10:47 AM 04/23/2021    2:49 PM  CBC  WBC 4.0 - 10.5 K/uL 8.5  6.1  9.0   Hemoglobin 12.0 - 15.0 g/dL 16.1  09.6  04.5   Hematocrit 36.0 - 46.0 % 47.5  50.3  41.7   Platelets 150 - 400 K/uL 249  283  255        CMP     Component Value Date/Time   NA 136 11/06/2023 1444   NA 140 02/23/2012 2001   K 4.0 11/06/2023 1444   K 4.5 02/23/2012 2001   CL 101 11/06/2023 1444   CL 104 02/23/2012 2001   CO2 25 11/06/2023 1444   CO2 24 02/23/2012 2001   GLUCOSE 85 11/06/2023 1444   GLUCOSE 111 (H) 02/23/2012 2001   BUN 14 11/06/2023 1444   BUN 16 02/23/2012 2001   CREATININE 0.65 11/06/2023 1444   CREATININE 0.84 02/23/2012 2001   CALCIUM 9.2  11/06/2023 1444   CALCIUM 9.7 02/23/2012 2001   PROT 7.4 11/06/2023 1444   PROT  8.0 02/23/2012 2001   ALBUMIN 4.1 11/06/2023 1444   ALBUMIN 4.2 02/23/2012 2001   AST 17 11/06/2023 1444   AST 38 (H) 02/23/2012 2001   ALT 14 11/06/2023 1444   ALT 25 02/23/2012 2001   ALKPHOS 51 11/06/2023 1444   ALKPHOS 90 02/23/2012 2001   BILITOT 0.7 11/06/2023 1444   BILITOT 0.4 02/23/2012 2001   GFRNONAA >60 11/06/2023 1444   GFRNONAA >60 02/23/2012 2001     No results found.     Assessment & Plan:   1. Lymphedema (Primary) Will replace referral for the lymphedema clinic for the patient.  In addition we will try to reach out to the lymphedema pump company to see if if there are options that may fit her better.  I think it is somewhat difficult due to her body habitus with her pump setting.  2. Factor V Leiden (HCC) Given the patient's history of thrombus and factor V disease, patient should continue with Eliquis.   3. Post-phlebitic syndrome This is also contributing to the patient's discomfort in the right lower extremity in addition to the lymphedema.    Current Outpatient Medications on File Prior to Visit  Medication Sig Dispense Refill   ELIQUIS 5 MG TABS tablet TAKE 1 TABLET BY MOUTH TWICE PER DAY 60 tablet 6   No current facility-administered medications on file prior to visit.    There are no Patient Instructions on file for this visit. No follow-ups on file.   Martavious Hartel E Erique Kaser, NP

## 2024-03-19 NOTE — Discharge Summary (Signed)
 ------------------------------------------------------------------------------- Attestation signed by Joesph Prentice SQUIBB, MD at 03/19/24 1142 I was available as needed but did not see the patient on day of discharge.  ------------ Prentice SQUIBB Joesph, MD PhD [facility] Medicine -------------------------------------------------------------------------------   Physician Discharge Summary [facility] 1 [facility] OBSERVATION [facility] [address] 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 *   Hospital Course:  Caroline Madden is a 43 year old female with a history of chronic right iliac vein deep vein thrombosis, factor V Leiden thrombophilia, lupus anticoagulant disorder, and morbid obesity, admitted for planned intervention due to worsening chronic lower extremity symptoms in the setting of chronically occluded right external iliac vein stent.  Chronic Right Iliac Vein Deep Vein Thrombosis and Stent Occlusion  She has a longstanding history of chronic right external iliac vein thrombosis with stent occlusion and multiple prior interventions. She presented with recent exacerbation of chronic lower extremity symptoms, including heaviness, numbness, and tingling, and underwent pelvic  venography with right iliofemoral venoplasty and placement of two overlapping stents on 03/17/2024 without complications. Post-procedure, she was started on therapeutic enoxaparin  and instructed by VIR attending to continue at discharge. VIR will determine if and when she can transition back to her home Eliquis during her 10/20 follow-up appointment (this was confirmed by her VIR proceduralist Dr Balinda on day of discharge). She remained stable and was deemed appropriate for discharge from the vascular interventional radiology standpoint. Mild pretibial pitting edema persisted in the right lower extremity, but no acute events or significant changes in swelling were noted during hospitalization. PT/OT consulted and did not recommend any outpatient follow-up.  By day of discharge she was ambulating halls, tolerating meals and excited to go home.  Factor V Leiden Thrombophilia and Lupus Anticoagulant Disorder  Her history of factor V Leiden and lupus anticoagulant disorder contributed to her chronic thrombosis and influenced the decision for ongoing anticoagulation therapy. She was transitioned to therapeutic enoxaparin  post-procedure and instructed on its administration prior to discharge.  Chronic Lower Extremity Symptoms  She reported chronic heaviness, numbness, and tingling in her legs, which had worsened prior to admission and were attributed to her chronic right iliac vein thrombosis and stent occlusion. These symptoms remained grossly unchanged post-intervention, with mild pretibial pitting edema noted on exam.  Morbid Obesity  Her BMI was documented at 48.66 kg/m, and no inpatient interventions were recommended for obesity during this admission.  Post-Procedure Sore Throat  On the morning following her procedure, she experienced hoarseness and mouth/throat soreness, likely secondary to intubation, which affected her ability to eat but did not result in further complications.  By day of discharge  she stated improvement and ability to tolerate oral intake.  Follow-Up  She was instructed to follow up in the vascular interventional radiology clinic in 6 weeks with Dr. Balinda, including a CTV pelvic runoff at  that time.   Procedures: Pelvic venography with right iliofemoral venoplasty and placement of two overlapping stents on 03/17/2024  IR VENOGRAM IVC [[id number]] ______________________________________________________________________ Discharge Medications:   Your Medication List     STOP taking these medications    ELIQUIS 5 mg Tab Generic drug: apixaban       START taking these medications    acetaminophen  325 MG tablet Commonly known as: TYLENOL  Take 2 tablets (650 mg total) by mouth every six (6) hours as needed.   enoxaparin  80 mg/0.8 mL injection Commonly known as: LOVENOX  Inject 0.8 mL (80 mg total) under the skin two (2) times a day.        Allergies: Egg derived, Penicillins, Strawberry, Wheat, and Other ______________________________________________________________________ Pending Test Results (if blank, then none):   Most Recent Labs: All lab results last 24 hours -  Recent Results (from the past 24 hours)  Comprehensive Metabolic Panel   Collection Time: 03/19/24  3:42 AM  Result Value Ref Range   Sodium 144 135 - 145 mmol/L   Potassium 3.6 3.4 - 4.8 mmol/L   Chloride 105 98 - 107 mmol/L   CO2 25.0 20.0 - 31.0 mmol/L   Anion Gap 14 5 - 14 mmol/L   BUN 7 (L) 9 - 23 mg/dL   Creatinine 9.35 9.44 - 1.02 mg/dL   BUN/Creatinine Ratio 11    eGFR CKD-EPI (2021) Female >90 >=60 mL/min/1.67m2   Glucose 156 70 - 179 mg/dL   Calcium 8.7 8.7 - 89.5 mg/dL   Albumin 3.6 3.4 - 5.0 g/dL   Total Protein 6.6 5.7 - 8.2 g/dL   Total Bilirubin 0.4 0.3 - 1.2 mg/dL   AST 16 <=65 U/L   ALT 11 10 - 49 U/L   Alkaline Phosphatase 48 46 - 116 U/L  Magnesium Level   Collection Time: 03/19/24  3:42 AM  Result Value Ref Range   Magnesium 2.1 1.6 - 2.6 mg/dL   Phosphorus Level   Collection Time: 03/19/24  3:42 AM  Result Value Ref Range   Phosphorus 1.8 (L) **phosphorus was replaced prior to discharge on 9/4** 2.4 - 5.1 mg/dL  CBC w/ Differential   Collection Time: 03/19/24  3:42 AM  Result Value Ref Range   WBC 10.5 3.6 - 11.2 10*9/L   RBC 4.68 3.95 - 5.13 10*12/L   HGB 14.8 11.3 - 14.9 g/dL   HCT 57.8 65.9 - 55.9 %   MCV 89.9 77.6 - 95.7 fL   MCH 31.6 25.9 - 32.4 pg   MCHC 35.1 32.0 - 36.0 g/dL   RDW 85.6 87.7 - 84.7 %   MPV     Platelet >212 150 - 450 10*9/L   Neutrophils % 68.1 %   Lymphocytes % 22.8 %   Monocytes % 7.8 %   Eosinophils % 0.5 %   Basophils % 0.8 %   Absolute Neutrophils 7.2 1.8 - 7.8 10*9/L   Absolute Lymphocytes 2.4 1.1 - 3.6 10*9/L   Absolute Monocytes 0.8 0.3 - 0.8 10*9/L   Absolute Eosinophils 0.0 0.0 - 0.5 10*9/L   Absolute Basophils 0.1 0.0 - 0.1 10*9/L  Morphology Review   Collection Time: 03/19/24  3:42 AM  Result Value Ref Range   Smear Review Comments See Comment Undefined   Microbiology -  Microbiology Results (last day)     ** No results found for the last 24 hours. **      CBC - Results in Past 30 Days Result Component Current Result Ref Range  Previous Result Ref Range  HCT 42.1 (03/19/2024) 34.0 - 44.0 % 45.0 (H) (03/17/2024) 34.0 - 44.0 %  HGB 14.8 (03/19/2024) 11.3 - 14.9 g/dL 83.9 (H) (0/01/7973) 88.6 - 14.9 g/dL  MCH 68.3 (0/11/7972) 74.0 - 32.4 pg 31.7 (03/17/2024) 25.9 - 32.4 pg  MCHC 35.1 (03/19/2024) 32.0 - 36.0 g/dL 64.4 (0/01/7973) 67.9 - 36.0 g/dL  MCV 10.0 (0/11/7972) 22.3 - 95.7 fL 89.4 (03/17/2024) 77.6 - 95.7 fL  MPV 8.1 (03/17/2024) 6.8 - 10.7 fL Not in Time Range   Platelet >212 (03/19/2024) 150 - 450 10*9/L 234 (03/17/2024) 150 - 450 10*9/L  RBC 4.68 (03/19/2024) 3.95 - 5.13 10*12/L 5.04 (03/17/2024) 3.95 - 5.13 10*12/L  WBC 10.5 (03/19/2024) 3.6 - 11.2 10*9/L 6.8 (03/17/2024) 3.6 - 11.2 10*9/L   BMP - Results in Past 30 Days Result Component Current Result Ref Range Previous Result Ref Range   BUN 7 (L) (03/19/2024) 9 - 23 mg/dL 18 (0/01/7973) 9 - 23 mg/dL  Chloride 894 (0/11/7972) 98 - 107 mmol/L 104 (03/17/2024) 98 - 107 mmol/L  CO2 25.0 (03/19/2024) 20.0 - 31.0 mmol/L 24.0 (03/17/2024) 20.0 - 31.0 mmol/L  Creatinine 0.64 (03/19/2024) 0.55 - 1.02 mg/dL 9.36 (0/01/7973) 9.44 - 1.02 mg/dL  Glucose 843 (0/11/7972) 70 - 179 mg/dL 95 (0/01/7973) 70 - 99 mg/dL  Potassium 3.6 (0/11/7972) 3.4 - 4.8 mmol/L Not in Time Range   Sodium 144 (03/19/2024) 135 - 145 mmol/L 141 (03/17/2024) 135 - 145 mmol/L   Coagulation - Results in Past 30 Days Result Component Current Result Ref Range Previous Result Ref Range  INR 0.96 (03/17/2024)  Not in Time Range   PT 10.9 (03/17/2024) 9.9 - 12.6 sec Not in Time Range    Cardiac markers -  No results found for requested labs within last 30 days.   ABGs-  No results found for requested labs within last 30 days.   LFT's - Results in Past 30 Days Result Component Current Result Ref Range Previous Result Ref Range  Albumin 3.6 (03/19/2024) 3.4 - 5.0 g/dL 3.9 (0/01/7973) 3.4 - 5.0 g/dL  Alkaline Phosphatase 48 (03/19/2024) 46 - 116 U/L 44 (L) (03/17/2024) 46 - 116 U/L  ALT 11 (03/19/2024) 10 - 49 U/L 12 (03/17/2024) 10 - 49 U/L  AST 16 (03/19/2024) <=34 U/L Not in Time Range   Total Bilirubin 0.4 (03/19/2024) 0.3 - 1.2 mg/dL 0.4 (0/01/7973) 0.3 - 1.2 mg/dL   Toxicology screen -  No results found for requested labs within last 30 days.  [    Relevant Studies/Radiology (if blank, then none): No results found. ______________________________________________________________________ Discharge Instructions:  Activity Instructions     Activity as tolerated    Additional Activity Instructions:   Post venogram instructions:  Rest for the next 24 hours: Do not drive a car for 24 hours. Take stairs leading with the opposite leg of the puncture site. Avoid strenuous activity and heavy lifting (above 10 pounds) for 1 week. Do not submerge site until healed. No tub baths, swimming pools  until puncture site healed.  Showers are ok. Do not operate heavy machinery, drink alcohol or sign legal documents for 24 hours.  If site bleeds, lay flat and hold firm pressure at site for about 10 minutes.  If you cannot get the site to stop bleeding, hold firm pressure and call 911.   Continue to hold pressure until help arrives.   Dressing stays intact for 2 days. After two days, remove dressing and gently wash site.  Dry and apply new clean  dressing.  Wash site and change dressing daily until healed.  You may return to your usual diet unless otherwise directed.   Please call [facility] VIR clinic at [phone] and choose option 3 to speak to a nurse for any problems or questions following discharge including:  Bleeding that saturates bandage Pain at site not controlled by regular pain medicines Signs of infection at site: redness, swelling, pus draining, fever above 101.80F Numbness, coolness, change in color of arm or leg.   Calls received during normal business hours (Monday through Friday from 8:00 am to 4:30 pm) will be returned within one business day. Calls received after normal business hours OR on weekends will be returned during the next business day.   If this is an after-hours urgent matter, you can call the Hospital front desk at [phone] and ask to speak with the Radiology doctor on call.  If you are experiencing a medical emergency, please call 911 or go to your nearest emergency room.         Diet Instructions     Discharge diet (specify)     Discharge Nutrition Therapy: Regular       Other Instructions     Call MD for:  difficulty breathing, headache or visual disturbances     Call MD for:  extreme fatigue     Call MD for:  hives     Call MD for:  persistent dizziness or light-headedness     Call MD for:  persistent nausea or vomiting     Call MD for:  redness, tenderness, or signs of infection (pain, swelling, redness, odor or green/yellow  discharge around incision site)     Call MD for:  severe uncontrolled pain     Call MD for: Temperature > 38.5 Celsius ( > 101.3 Fahrenheit)     Discharge instructions     STOP home Eliquis. VIR wants you to continue Lovenox  shots twice a day.  Please continue the shots through to your appointment with VIR that scheduled on 10/20.  At that time the VIR attending will discuss if they want you to continue Lovenox  for to resume home Eliquis.   Discharge instructions to patient: Call your primary care doctor and make an appointment to see them:     Within 2 weeks from the time you are discharged from the hospital       Follow Up instructions and Outpatient Referrals    Ambulatory Referral to Home Health     Reason for referral: medication teaching and mgmt, rehab services   Physician to follow patient's care: PCP   Disciplines requested:  Nursing Physical Therapy Occupational Therapy     Nursing requested: Teaching/skilled observation and assessment   What teaching is needed (new diagnosis? new medications?): medication  teaching and mgmt   Physical Therapy requested:  Home safety evaluation Evaluate and treat     Occupational Therapy Requested:  Home safety evaluation Evaluate and treat     Requested follow up plan: You would evaluate and manage.   Ambulatory Referral to Interventional Radiology     Reason for referral: follow-up with VIR clinic in 6 weeks with Dr.  Gwen   Requested follow up plan: You would evaluate and manage.   Call MD for:  difficulty breathing, headache or visual disturbances     Call MD for:  extreme fatigue     Call MD for:  hives     Call MD for:  persistent dizziness or light-headedness  Call MD for:  persistent nausea or vomiting     Call MD for:  redness, tenderness, or signs of infection (pain, swelling,  redness, odor or green/yellow discharge around incision site)     Call MD for:  severe uncontrolled pain     Call MD for: Temperature > 38.5  Celsius ( > 101.3 Fahrenheit)     Discharge instructions       Appointments which have been scheduled for you    May 04, 2024 12:40 PM (Arrive by 12:25 PM) CT PELVIC VENOGRAM RUN OFF with IC CT RM 1 RAD [facility]) [address] [facility] [address] [place] [address] [phone]  On appt date: Drink lots of water 24 hrs Bring recent lab work Take meds as usual [employer] fast streamer of current meds Bring snack if diabetic  On appt date do not: Consume anything 2 hrs prior to your appointment  Let us  know if pt: Allergic to contrast dyes Diabetic Pregnant or nursing Claustrophobic  (Title:[id number])     May 04, 2024 1:20 PM (Arrive by 12:50 PM) RETURN GENERAL with Alexandra K Banathy, MD [facility] VIR CLINIC [address] [place] [facility]) [address] [place] [address] [phone]        ______________________________________________________________________ Discharge Day Services: BP 130/82   Pulse 91   Temp 36.9 C (98.4 F) (Oral)   Resp 18   Ht 129.5 cm (4' 3)   Wt 81.6 kg (180 lb)   SpO2 93%   BMI 48.66 kg/m  Pt seen on the day of discharge and determined appropriate for discharge.  Condition at Discharge: good  Length of Discharge: I spent greater than 30 mins in the discharge of this patient.

## 2024-03-23 ENCOUNTER — Emergency Department

## 2024-03-23 ENCOUNTER — Emergency Department
Admission: EM | Admit: 2024-03-23 | Discharge: 2024-03-23 | Disposition: A | Discharge status: Home / Self Care | Attending: Emergency Medicine | Admitting: Emergency Medicine

## 2024-03-23 DIAGNOSIS — R531 Weakness: Secondary | ICD-10-CM | POA: Insufficient documentation

## 2024-03-23 DIAGNOSIS — R200 Anesthesia of skin: Secondary | ICD-10-CM | POA: Diagnosis present

## 2024-03-23 DIAGNOSIS — R0602 Shortness of breath: Secondary | ICD-10-CM | POA: Diagnosis not present

## 2024-03-23 LAB — CBC
HCT: 46.7 % — ABNORMAL HIGH (ref 36.0–46.0)
Hemoglobin: 15.9 g/dL — ABNORMAL HIGH (ref 12.0–15.0)
MCH: 30.9 pg (ref 26.0–34.0)
MCHC: 34 g/dL (ref 30.0–36.0)
MCV: 90.7 fL (ref 80.0–100.0)
Platelets: 227 K/uL (ref 150–400)
RBC: 5.15 MIL/uL — ABNORMAL HIGH (ref 3.87–5.11)
RDW: 13 % (ref 11.5–15.5)
WBC: 7.2 K/uL (ref 4.0–10.5)
nRBC: 0 % (ref 0.0–0.2)

## 2024-03-23 LAB — APTT: aPTT: 34 s (ref 24–36)

## 2024-03-23 LAB — PROTIME-INR
INR: 1 (ref 0.8–1.2)
Prothrombin Time: 13.7 s (ref 11.4–15.2)

## 2024-03-23 LAB — COMPREHENSIVE METABOLIC PANEL WITH GFR
ALT: 44 U/L (ref 0–44)
AST: 43 U/L — ABNORMAL HIGH (ref 15–41)
Albumin: 4.2 g/dL (ref 3.5–5.0)
Alkaline Phosphatase: 59 U/L (ref 38–126)
Anion gap: 13 (ref 5–15)
BUN: 18 mg/dL (ref 6–20)
CO2: 25 mmol/L (ref 22–32)
Calcium: 10.2 mg/dL (ref 8.9–10.3)
Chloride: 99 mmol/L (ref 98–111)
Creatinine, Ser: 0.65 mg/dL (ref 0.44–1.00)
GFR, Estimated: >60 mL/min (ref >60)
Glucose, Bld: 93 mg/dL (ref 70–99)
Potassium: 4.1 mmol/L (ref 3.5–5.1)
Sodium: 137 mmol/L (ref 135–145)
Total Bilirubin: 0.8 mg/dL (ref 0.0–1.2)
Total Protein: 7.7 g/dL (ref 6.5–8.1)

## 2024-03-23 LAB — URINALYSIS, ROUTINE W REFLEX MICROSCOPIC
Bilirubin Urine: NEGATIVE
Glucose, UA: NEGATIVE mg/dL
Hgb urine dipstick: NEGATIVE
Ketones, ur: 5 mg/dL — AB
Leukocytes,Ua: NEGATIVE
Nitrite: NEGATIVE
Protein, ur: NEGATIVE mg/dL
Specific Gravity, Urine: 1.009 (ref 1.005–1.030)
pH: 6 (ref 5.0–8.0)

## 2024-03-23 LAB — DIFFERENTIAL
Abs Immature Granulocytes: 0.04 K/uL (ref 0.00–0.07)
Basophils Absolute: 0 K/uL (ref 0.0–0.1)
Basophils Relative: 1 %
Eosinophils Absolute: 0.1 K/uL (ref 0.0–0.5)
Eosinophils Relative: 2 %
Immature Granulocytes: 1 %
Lymphocytes Relative: 39 %
Lymphs Abs: 2.8 K/uL (ref 0.7–4.0)
Monocytes Absolute: 0.6 K/uL (ref 0.1–1.0)
Monocytes Relative: 9 %
Neutro Abs: 3.5 K/uL (ref 1.7–7.7)
Neutrophils Relative %: 48 %

## 2024-03-23 LAB — PREGNANCY, URINE: Preg Test, Ur: NEGATIVE

## 2024-03-23 LAB — HCG, QUANTITATIVE, PREGNANCY: hCG, Beta Chain, Quant, S: <1 m[IU]/mL (ref <5)

## 2024-03-23 LAB — ETHANOL: Alcohol, Ethyl (B): <15 mg/dL (ref <15)

## 2024-03-23 MED ORDER — ENOXAPARIN SODIUM 80 MG/0.8ML IJ SOSY
80.0000 mg | PREFILLED_SYRINGE | Freq: Once | INTRAMUSCULAR | Status: AC
  Administered 2024-03-23: 80 mg via SUBCUTANEOUS
  Filled 2024-03-23: qty 0.8

## 2024-03-23 MED ORDER — PREDNISONE 20 MG PO TABS: 40.0000 mg | ORAL_TABLET | Freq: Every day | ORAL | 0 refills | Status: AC

## 2024-03-23 MED ORDER — IOHEXOL 350 MG/ML SOLN
75.0000 mL | Freq: Once | INTRAVENOUS | Status: AC | PRN
  Administered 2024-03-23: 75 mL via INTRAVENOUS

## 2024-03-23 MED ORDER — SODIUM CHLORIDE 0.9 % IV SOLN
500.0000 mg | Freq: Once | INTRAVENOUS | Status: AC
  Administered 2024-03-23: 500 mg via INTRAVENOUS
  Filled 2024-03-23: qty 5

## 2024-03-23 MED ORDER — SODIUM CHLORIDE 0.9% FLUSH
3.0000 mL | Freq: Once | INTRAVENOUS | Status: AC
  Administered 2024-03-23: 3 mL via INTRAVENOUS

## 2024-03-23 MED ORDER — AZITHROMYCIN 250 MG PO TABS: ORAL_TABLET | ORAL | 0 refills | Status: AC

## 2024-03-23 NOTE — ED Notes (Signed)
 Pt's father arrived to transport home. Pt given DC Instructions. Pt verbalized understanding of medications and follow up care. Pt taken from ED in wheelchair by this RN.

## 2024-03-23 NOTE — ED Notes (Signed)
 To MRI

## 2024-03-23 NOTE — Discharge Instructions (Addendum)
 Please contact your doctor to see about getting close follow up. Please return for any worsening symptoms.

## 2024-03-23 NOTE — ED Notes (Signed)
 MRI called to speak with pt to her screening. When asked if she would be able to verbally answer questions, pt shook her head no. MRI made aware of pt's response.

## 2024-03-23 NOTE — ED Provider Notes (Signed)
 [facility] Provider Note    Event Date/Time   First MD Initiated Contact with Patient 03/23/24 1502     (approximate)   History   Numbness   HPI  Caroline Madden is a 43 y.o. female  who presents to the emergency department today because of concern for weakness and numbness to her right face, difficulty with speech and shortness of breath. History is primarily obtained from family at bedside given patient's difficulty with speech. They state that her symptoms (except for shortness of breath) started when she was discharged from [facility].  She had admission there for lower extremity blood clot.  Patient denies any weakness or numbness in any extremities.  Denies any fevers.      Physical Exam   Triage Vital Signs: ED Triage Vitals  Encounter Vitals Group     BP 03/23/24 1428 135/66     Girls Systolic BP Percentile --      Girls Diastolic BP Percentile --      Boys Systolic BP Percentile --      Boys Diastolic BP Percentile --      Pulse Rate 03/23/24 1428 98     Resp 03/23/24 1428 15     Temp 03/23/24 1428 98.9 F (37.2 C)     Temp src --      SpO2 03/23/24 1428 100 %     Weight 03/23/24 1430 179 lb 10.8 oz (81.5 kg)     Height 03/23/24 1430 4' 3 (1.295 m)     Head Circumference --      Peak Flow --      Pain Score 03/23/24 1429 0     Pain Loc --      Pain Education --      Exclude from Growth Chart --     Most recent vital signs: Vitals:   03/23/24 1428  BP: 135/66  Pulse: 98  Resp: 15  Temp: 98.9 F (37.2 C)  SpO2: 100%   General: Awake, alert, oriented. CV:  Good peripheral perfusion. Regular rate and rhythm. Resp:  Normal effort. Lungs clear. Abd:  No distention.    ED Results / Procedures / Treatments   Labs (all labs ordered are listed, but only abnormal results are displayed) Labs Reviewed  CBC - Abnormal; Notable for the following components:      Result Value   RBC 5.15 (*)    Hemoglobin 15.9 (*)    HCT 46.7 (*)     All other components within normal limits  COMPREHENSIVE METABOLIC PANEL WITH GFR - Abnormal; Notable for the following components:   AST 43 (*)    All other components within normal limits  DIFFERENTIAL  PREGNANCY, URINE  PROTIME-INR  APTT  ETHANOL  HCG, QUANTITATIVE, PREGNANCY  I-STAT CREATININE, ED  CBG MONITORING, ED     EKG  I, Guadalupe Eagles, attending physician, personally viewed and interpreted this EKG  EKG Time: 1419 Rate: 86 Rhythm: sinus rhythm Axis: left axis deviation Intervals: qtc 436 QRS: narrow ST changes: no st elevation Impression: abnormal ekg  RADIOLOGY I independently interpreted and visualized the CXR. My interpretation: No pneumonia Radiology interpretation:  IMPRESSION:  Low lung volumes with very mild left basilar atelectasis and/or  infiltrate.    I independently interpreted and visualized the CT angio head. My interpretation: No abnormality Radiology interpretation:  IMPRESSION:  1. No large vessel occlusion, hemodynamically significant stenosis, aneurysm,  or dissection of the arteries in the head and neck.  I independently interpreted and visualized the MR brain. My interpretation: No large mass Radiology interpretation:  IMPRESSION:  Normal brain MRI for age. No acute intracranial abnormality  identified.     PROCEDURES:  Critical Care performed: No    MEDICATIONS ORDERED IN ED: Medications  sodium chloride  flush (NS) 0.9 % injection 3 mL (has no administration in time range)     IMPRESSION / MDM / ASSESSMENT AND PLAN / ED COURSE  I reviewed the triage vital signs and the nursing notes.                              Differential diagnosis includes, but is not limited to, CVA, vascular occlusion, bell's palsy  Patient's presentation is most consistent with acute presentation with potential threat to life or bodily function.   Patient presented to the emergency department today because of concerns for right sided  facial numbness, difficulty with speech, shortness of breath.  On exam patient does appear to have some difficulty with speech.  Lungs were clear.  X-ray is concerning for possible pneumonia versus atelectasis.  No leukocytosis or fever.  I do have a lower concern for pneumonia however will treat with antibiotics.  Did obtain MRI of the brain to evaluate numbness and speech difficulties.  This was negative for acute stroke.  While patient was here her speech did not improve.  Did discuss possibility of Bell's palsy with the patient.  Will give prescription for steroids.  Discussed with patient portance of close follow-up with outpatient physicians.  FINAL CLINICAL IMPRESSION(S) / ED DIAGNOSES   Final diagnoses:  Facial numbness      Note:  This document was prepared using Dragon voice recognition software and may include unintentional dictation errors.    Floy Roberts, MD 03/23/24 [phone]

## 2024-03-23 NOTE — ED Provider Notes (Signed)
 Clinical Course as of 03/23/24 1426  Mon Mar 23, 2024  1425 Called into room to triage patient to determine whether patient is a stroke.  Patient has had a week of symptoms and came in today because the facial numbness is worse today.  Has a clotting disorder control and recent [facility] admission.  Does not meet acute stroke criteria on my assessment [HD]    Clinical Course User Index [HD] Nicholaus Rolland BRAVO, MD   Will place triage orders and next physician to care for patient   Nicholaus Rolland BRAVO, MD 03/23/24 1426

## 2024-03-23 NOTE — ED Notes (Signed)
 Patient transported to CT

## 2024-03-23 NOTE — ED Triage Notes (Signed)
 Pt to ED via [facility] from home c/o of right sided facial numbness. Pt states that this has been ongoing all week, but has progressively gotten worse today. Pt had recent surgery on 03/17/24 for blood clot removal. Pt currently on Lovenox . Pt is having a difficult time speaking due to pain.   HR 104 O2: 99 152/98 (118)

## 2024-03-23 NOTE — ED Notes (Signed)
 CCMD called at this time to place pt on central monitoring.

## 2024-03-23 NOTE — ED Notes (Signed)
 Pt returned from MRI. Connected back to abx and monitoring devices at this time. Call bell in reach. No further needs or requests at this time.

## 2024-05-20 ENCOUNTER — Inpatient Hospital Stay

## 2024-05-20 ENCOUNTER — Inpatient Hospital Stay: Admitting: Internal Medicine

## 2024-05-28 ENCOUNTER — Ambulatory Visit (INDEPENDENT_AMBULATORY_CARE_PROVIDER_SITE_OTHER): Admitting: Vascular Surgery
